# Patient Record
Sex: Male | Born: 1945 | Race: White | Hispanic: Yes | Marital: Single | Smoking: Never smoker
Health system: Southern US, Community
[De-identification: ages and names within clinical notes are randomized; demographics above are authoritative.]

## PROBLEM LIST (undated history)

## (undated) DIAGNOSIS — E119 Type 2 diabetes mellitus without complications: Secondary | ICD-10-CM

## (undated) DIAGNOSIS — I739 Peripheral vascular disease, unspecified: Secondary | ICD-10-CM

## (undated) DIAGNOSIS — I10 Essential (primary) hypertension: Secondary | ICD-10-CM

## (undated) DIAGNOSIS — F1011 Alcohol abuse, in remission: Secondary | ICD-10-CM

---

## 1998-12-06 ENCOUNTER — Emergency Department (HOSPITAL_COMMUNITY): Admission: EM | Admit: 1998-12-06 | Discharge: 1998-12-06 | Payer: Self-pay | Admitting: Emergency Medicine

## 1999-05-14 ENCOUNTER — Emergency Department (HOSPITAL_COMMUNITY): Admission: EM | Admit: 1999-05-14 | Discharge: 1999-05-15 | Payer: Self-pay | Admitting: Emergency Medicine

## 1999-05-15 ENCOUNTER — Encounter: Payer: Self-pay | Admitting: Emergency Medicine

## 2004-04-20 ENCOUNTER — Emergency Department (HOSPITAL_COMMUNITY): Admission: EM | Admit: 2004-04-20 | Discharge: 2004-04-20 | Payer: Self-pay | Admitting: *Deleted

## 2005-02-04 ENCOUNTER — Emergency Department (HOSPITAL_COMMUNITY): Admission: EM | Admit: 2005-02-04 | Discharge: 2005-02-04 | Payer: Self-pay | Admitting: Family Medicine

## 2009-08-09 ENCOUNTER — Emergency Department (HOSPITAL_COMMUNITY): Admission: EM | Admit: 2009-08-09 | Discharge: 2009-08-10 | Payer: Self-pay | Admitting: Emergency Medicine

## 2014-03-22 ENCOUNTER — Encounter (HOSPITAL_COMMUNITY): Payer: Self-pay | Admitting: Emergency Medicine

## 2014-03-22 ENCOUNTER — Emergency Department (HOSPITAL_COMMUNITY)
Admission: EM | Admit: 2014-03-22 | Discharge: 2014-03-22 | Disposition: A | Discharge status: Home / Self Care | Payer: Self-pay | Attending: Emergency Medicine | Admitting: Emergency Medicine

## 2014-03-22 DIAGNOSIS — F10929 Alcohol use, unspecified with intoxication, unspecified: Secondary | ICD-10-CM

## 2014-03-22 DIAGNOSIS — F1010 Alcohol abuse, uncomplicated: Secondary | ICD-10-CM | POA: Insufficient documentation

## 2014-03-22 LAB — RAPID URINE DRUG SCREEN, HOSP PERFORMED
AMPHETAMINES: NOT DETECTED
BENZODIAZEPINES: NOT DETECTED
Barbiturates: NOT DETECTED
COCAINE: NOT DETECTED
Opiates: NOT DETECTED
Tetrahydrocannabinol: NOT DETECTED

## 2014-03-22 LAB — COMPREHENSIVE METABOLIC PANEL
ALT: 36 U/L (ref 0–53)
AST: 54 U/L — ABNORMAL HIGH (ref 0–37)
Albumin: 3.4 g/dL — ABNORMAL LOW (ref 3.5–5.2)
Alkaline Phosphatase: 170 U/L — ABNORMAL HIGH (ref 39–117)
BUN: 4 mg/dL — ABNORMAL LOW (ref 6–23)
CALCIUM: 8.7 mg/dL (ref 8.4–10.5)
CO2: 24 mEq/L (ref 19–32)
Chloride: 97 mEq/L (ref 96–112)
Creatinine, Ser: 0.58 mg/dL (ref 0.50–1.35)
GFR calc non Af Amer: >90 mL/min (ref >90)
GLUCOSE: 285 mg/dL — AB (ref 70–99)
Potassium: 3.8 mEq/L (ref 3.7–5.3)
Sodium: 138 mEq/L (ref 137–147)
TOTAL PROTEIN: 7.4 g/dL (ref 6.0–8.3)
Total Bilirubin: 0.4 mg/dL (ref 0.3–1.2)

## 2014-03-22 LAB — CBC
HCT: 36.1 % — ABNORMAL LOW (ref 39.0–52.0)
HEMOGLOBIN: 12.4 g/dL — AB (ref 13.0–17.0)
MCH: 28.6 pg (ref 26.0–34.0)
MCHC: 34.3 g/dL (ref 30.0–36.0)
MCV: 83.2 fL (ref 78.0–100.0)
Platelets: 161 10*3/uL (ref 150–400)
RBC: 4.34 MIL/uL (ref 4.22–5.81)
RDW: 15 % (ref 11.5–15.5)
WBC: 3.3 10*3/uL — ABNORMAL LOW (ref 4.0–10.5)

## 2014-03-22 LAB — ETHANOL: ALCOHOL ETHYL (B): 294 mg/dL — AB (ref 0–11)

## 2014-03-22 MED ORDER — ONDANSETRON HCL 4 MG PO TABS: 4.0000 mg | ORAL_TABLET | Freq: Three times a day (TID) | ORAL | Status: DC | PRN

## 2014-03-22 MED ORDER — LORAZEPAM 1 MG PO TABS: 1.0000 mg | ORAL_TABLET | Freq: Three times a day (TID) | ORAL | Status: DC | PRN

## 2014-03-22 MED ORDER — ZOLPIDEM TARTRATE 5 MG PO TABS: 5.0000 mg | ORAL_TABLET | Freq: Every evening | ORAL | Status: DC | PRN

## 2014-03-22 MED ORDER — IBUPROFEN 200 MG PO TABS: 600.0000 mg | ORAL_TABLET | Freq: Three times a day (TID) | ORAL | Status: DC | PRN

## 2014-03-22 MED ORDER — NICOTINE 21 MG/24HR TD PT24: 21.0000 mg | MEDICATED_PATCH | Freq: Every day | TRANSDERMAL | Status: DC

## 2014-03-22 MED ORDER — ALUM & MAG HYDROXIDE-SIMETH 200-200-20 MG/5ML PO SUSP: 30.0000 mL | ORAL | Status: DC | PRN

## 2014-03-22 NOTE — Discharge Instructions (Signed)
Problemas Con El Alcohol (Alcohol Problems) La mayora de los adultos que beben alcohol lo hacen con moderacin (no demasiado) y poseen bajo riesgo de Warehouse manager problemas relacionados con la bebida. Sin embargo, todos los bebedores, inclusive los de bajo riesgo, Gaffer los riesgos de la salud que conlleva la ingesta de alcohol. RECOMENDACIONES PARA LOS BEBEDORES DE BAJO RIESGO Beber con moderacin. La ingesta moderada de alcohol se establece de la siguiente manera:   Hombres  no ms de dos tragos Google.  Mujeres  no ms de un trago Google.  Mayores de 65 aos  no ms de un trago Air cabin crew. Un trago estndar es 12 gramos de alcohol puro, lo que es lo mismo que una botella de 12 onzas de cerveza o Merchant navy officer, un vaso de vino de 5 onzas, o 1 onza y media de bebidas blancas (como whisky, brandy, vodka, o ron).  ABSTNGASE (NO BEBA) ALCOHOL:  Si est embarazada o contempla la posibilidad.  Cuando tome un medicamento que interacta con el alcohol.  Si usted es dependiente del alcohol.  Sufre alguna enfermedad en la que se prohba el consumo de alcohol (como lceras, enfermedades hepticas, o enfermedades cardacas). HABLE CON EL MDICO:  Si tiene riesgo de sufrir una enfermedad coronaria, converse sobre los potenciales beneficios y riesgos de la ingesta de alcohol: La ingesta baja a moderada de alcohol est asociada con tasas menores de enfermedades coronarias en ciertas poblaciones (por ejemplo, hombres mayores de 45 aos y mujeres postmenopusicas). Se aconseja a las personas no bebedoras o abstemias no comenzar con la ingesta baja a moderada para reducir el riesgo de enfermedades coronarias en funcin de evitar la aparicin de un problema relacionado con el alcohol. Pueden obtenerse efectos protectores similares a travs de una dieta y ejercicios adecuados.  Las mujeres y los ancianos tienen menos cantidad de agua en el [employer]. Como consecuencia de [place], las mujeres y los  ancianos tienen mayor concentracin de alcohol en la sangre despus de beber la misma cantidad de alcohol.  La exposicin del feto al alcohol puede ocasionar una gran cantidad de defectos de nacimientos dominados Sndrome Alcohlico Fetal (FAS) o Defectos de Nacimiento Relacionados con el Alcohol (ARBD). Aunque los FAS y los ARBD estn asociados con el consumo excesivo de alcohol durante el [place], tambin se han informado trastornos de Leisure centre manager en nios nacidos de madres que informaron haber bebido un trago en promedio por Mudlogger.  El abuso de alcohol (como el consumo de ms de cuatro tragos por ocasin en hombres y ms de tres tragos por ocasin en mujeres) perjudica a lo cognitivo (aprendizaje) y las funciones psicomotoras e incrementa el riesgo de problemas relacionados con el alcohol, inclusive accidentes y daos. PREGUNTAS CECA:   Algunas vez ha sentido que deba cortar con la bebida?  Se ha enojado usted con alguien que lo critic por lo que bebe?  Alguna vez se ha sentido mal o culpable por lo que bebe?  Ha tomado alguna vez un trago por la maana para calmar sus nervios o para deshacerse de una "resaca" (para "abrir los ojos")? Si ha respondido de [place] positiva a [place] de estas preguntas: Podra estar en riesgo de tener problemas relacionados con el alcohol si el consumo del mismo es:   Hombres: Mayor a 14 tragos por semana o ms de 4 tragos por ocasin.  Mujeres: Mayor a 7 tragos por semana o ms de 3 tragos por ocasin. Tiene usted o su familia  alguna historia clnica de problemas relacionados con el alcohol como:  Prdida del conocimiento.  Disfuncin sexual.  Depresin.  Traumatismos.  Enfermedades hepticas.  Trastornos del sueo.  Hipertensin arterial.  Dolor crnico abdominal.  Alguna vez el beber le ha ocasionado problemas, como por ejemplo con su familia, en su rendimiento laboral (o escolar), accidentes o lesiones?  Ha tenido una  compulsin a beber o se ha preocupado mientras lo haca?  Posee poco control o es incapaz de parar de beber una vez que ha comenzado?  Ha tenido que beber para evitar sntomas de abstinencia?  Ha tenido problemas con la abstinencia como temblores, nuseas, sudor o cambios en el humor?  Necesita ms alcohol que antes para emborracharse?  Siente una fuerte necesidad de beber?  Cambia de planes para poder beber?  Alguna vez ha bebido en la maana para aliviar temblores o resaca? Si ha respondido a [place] de estas preguntas de [employer], puede que sea el momento de hablar con un profesional, familiar o amigos y ver si ellos creen que tiene un problema. El alcoholismo es una dependencia qumica que puede Theme park manager y Environmental health practitioner a destruir su salud y Teacher, English as a foreign language. Muchos alcohlicos mueren, se empobrecen o terminan en prisin. Esto es a menudo el resultado de una dependencia qumica.  No se desaliente si no est listo para actuar inmediatamente.  Las decisiones para cambiar su comportamiento a menudo implican altas y bajas entre el deseo de [place] y la sensacin de que no puede decidirse.  Intente pensar ms seriamente sobre su comportamiento frente a la bebida.  Piense en razones para dejarla. PARA OBTENER INFORMACIN ADICIONAL, CONCURRIR A:  The [employer] on Alcohol Abuse and Alcoholism (NIAAA) BasicStudents.dk   [employer] on Alcoholism and Drug Dependence (NCADD) www.ncadd.org  American Society of Addiction Medicine (ASAM) RoyalDiary.gl  Document Released: 01/21/2008 Document Revised: 01/06/2012 [facility] Patient Information 2014 [place], [place].  Intoxicacin alcohlica (Alcohol Intoxication) La intoxicacin alcohlica se produce cuando la cantidad de alcohol que se ha consumido daa la capacidad de funcionamiento mental y fsico. El alcohol deteriora directamente la actividad qumica normal del cerebro. Beber grandes cantidades de alcohol puede conducir  a Building control surveyor funcionamiento mental y en el comportamiento, y puede causar muchos efectos fsicos que pueden ser perjudiciales.  La intoxicacin alcohlica puede variar en gravedad desde leve hasta muy grave. Hay varios factores que pueden afectar el nivel de intoxicacin que se produce, como la edad de la persona, el sexo, el peso, la frecuencia de consumo de alcohol, y la presencia de otras enfermedades mdicas (como diabetes, convulsiones o enfermedades del corazn). Los niveles peligrosos de intoxicacin por alcohol pueden ocurrir [employer]ee personas beben grandes cantidades de alcohol en un corto periodo de tiempo ([place]). El alcohol tambin puede ser especialmente peligroso cuando se combina con ciertos medicamentos recetados o drogas "recreativas". SIGNOS Y SNTOMAS Algunos de los signos y sntomas comunes de intoxicacin leve por alcohol incluyen:  Prdida de la coordinacin.  Cambios en el estado de nimo y la conducta.  Incapacidad para razonar.  Hablar arrastrando las palabras. A medida que la intoxicacin por alcohol avanza a niveles ms graves, Zenaida Niece a Research officer, trade union otros signos y sntomas. Estos pueden ser:  Vmitos.  Confusin y alteracin de Scientist, clinical (histocompatibility and immunogenetics).  Disminucin de Engineer, manufacturing systems.  Convulsiones.  Prdida de la conciencia. DIAGNSTICO  El mdico le har una historia clnica y un examen fsico. Se le preguntar acerca de la cantidad y el tipo de alcohol que ha consumido. Se  le realizarn anlisis de sangre para medir la concentracin de alcohol en sangre. En muchos lugares, el nivel de alcohol en la sangre debe ser inferior a 80 mg / dL (1,61%) para poder conducir legalmente. Sin embargo, hay muchos efectos peligrosos del alcohol que pueden ocurrir con niveles mucho ms bajos.  TRATAMIENTO  Las personas con intoxicacin por alcohol a menudo no requieren TEFL teacher. La mayor parte de los efectos de la intoxicacin por alcohol son temporales, y desaparecen a  medida que el alcohol abandona el cuerpo de forma natural. El profesional controlar su estado hasta que est lo suficientemente estable como para volver a casa. A veces se administran lquidos por va intravenosa para ayudar a evitar la deshidratacin.  INSTRUCCIONES PARA EL CUIDADO EN EL HOGAR  No conduzca vehculos despus de beber alcohol.  Mantngase hidratado. Beba gran cantidad de lquido para mantener la orina de tono claro o color amarillo plido. Evite la cafena.   Tome slo medicamentos de venta libre o recetados, segn las indicaciones del mdico.  SOLICITE ATENCIN MDICA SI:   Tiene vmitos persistentes.   No mejora luego de [employer].  Se intoxica con alcohol con frecuencia. El mdico podr ayudarlo a decidir si debe consultar a un terapeuta especializado en el abuso de sustancias. SOLICITE ATENCIN MDICA DE INMEDIATO SI:   Se siente vacilante o tembloroso cuando trata de abandonar el hbito.   Comienza a temblar de manera incontrolable (convulsiones).   Vomita sangre. Puede ser sangre de color rojo brillante o similar al sedimento del caf negro.   Maxie Better en la materia fecal. Puede ser de color rojo brillante o de aspecto alquitranado, con olor ftido.   Se siente mareado o se desmaya.  ASEGRESE DE QUE:   Comprende estas instrucciones.  Controlar su afeccin.  Recibir ayuda de inmediato si no mejora o si empeora. Document Released: 10/14/2005 Document Revised: 06/16/2013 [facility] Patient Information 2014 [place], [place].  Trastorno por abuso de alcohol  (Alcohol Use Disorder) El trastorno por abuso de alcohol es un trastorno mental. No se trata de un incidente nico en el que se bebe en abundancia. Este trastorno se debe al consumo incontrolable del alcohol a lo largo del [place], que causa dificultades en una o ms reas de la vida diaria. Al consumir en exceso, las personas que sufren este trastorno estn en riesgo de daarse a s mismos  y a Dentist. Tambin puede causar otros trastornos [place], como trastornos del [place] de nimo y trastornos de [place], y problemas fsicos graves. Las personas que sufren trastornos por consumo de alcohol, generalmente abusan de otras sustancias.  Los trastornos por consumo de alcohol son frecuentes y se han generalizado. Algunas personas beben alcohol para poder enfrentarse o escapar a las situaciones negativas de la vida. Otros beben para Government social research officer crnico o los sntomas de una enfermedad mental. Las personas con historia familiar de trastornos por consumo de alcohol tienen ms riesgo de perder el control y de consumir alcohol en exceso.  SNTOMAS  Los signos y sntomas pueden ser:   Consumo dealcohol engrandes cantidades o durante perodos ms prolongados que lo previsto.  Mltiples intentos fallidos de dejar de bebero controlar el consumo de alcohol.   Emplear gran cantidad de tiempo para obtener, consumir o recuperarse de los efectos del alcohol (resaca).  Un deseo intenso o necesidad de consumir alcohol (abstinencia).   Continuar consumiendo alcohol a pesar de los [employer], en la escuela o en el hogar debido al  consumo.   Continuar consumiendo a Scientist, water quality de American Standard Companies de relacin debido al consumo.  Continuar consumiendo an en situaciones que impliquen un peligro fsico, como al conducir un vehculo.  Continuar consumiendo a pesar de saber que un problema fsico o psicolgico probablemente se relaciones con el consumo de alcohol. Los problemas fsicos relacionados con el consumo de alcohol pueden producirse en el cerebro, el corazn, el hgado, el estmago y los intestinos. Los problemas psicolgicos relacionados con el consumo de alcohol incluyen intoxicacin, depresin, ansiedad, psicosis, delirio y Photographer.   La necesidad de aumentar la cantidad de alcohol para Wellsite geologist deseado, o una disminucin del efecto al consumir la misma cantidad de  alcohol (tolerancia).  Sndrome de abstinencia al reducir o Photographer consumo, o tener que consumir alcohol para reducir o Multimedia programmer sndrome de abstinencia. Los sntomas de la abstinencia incluyen:  Frecuencia cardaca acelerada.  Temblores en las manos.  Dificultad para dormir.  Nuseas.  Vmitos.  Alucinaciones.  Agitacin.  Convulsiones. DIAGNSTICO  El mdico es el encargado de diagnosticar el trastorno por consumo de alcohol. Comenzar hacindole tres o cuatro preguntas para evaluar si consume alcohol en forma excesiva o si representa un problema. Para confirmar el diagnstico de trastorno por consumo de alcohol, deben estar presentes al [employer] sntomas (vea SNTOMAS) durante un perodo de 12 meses. La gravedad del trastorno depende del nmero de sntomas:   Leve dos o tres.  Moderado cuatro o cinco.  Grave seis o ms. El mdico podr Sports coach un examen fsico o [employer] Scientific los [place] de las pruebas de laboratorio para ver si usted tiene problemas fsicos como resultado del consumo de alcohol. Podr derivarlo a Administrator, sports de la salud mental para que realice una evaluacin.  TRATAMIENTO  Algunas personas podrn reducir el consumo a niveles en los que haya un bajo riesgo. Otras personas deben dejar de beber. Cuando sea necesario, podr recibir ayuda de los profesionales de la salud mental, capacitados en el tratamiento del abuso de sustancias. El mdico podr informarlo sobre la gravedad de su trastorno por consumo de alcohol y decidir qu tipo de tratamiento necesita. Las formas de tratamiento disponibles son:   Desintoxicacin. La desintoxicacin consiste en el uso de medicamentos recetados para evitar el sndrome de abstinencia durante la primera semana en la que se deja de consumir. Esto es importante para las personas con historia de sndrome de abstinencia y para los que consumen en exceso, quienes probablemente sufran el sndrome de abstinencia. El sndrome de  abstinencia puede ser peligroso y en algunos casos puede causar la [place]. La desintoxicacin generalmente se realiza internado en un hospital o en una institucin para pacientes que abusan de sustancias.  Asesoramiento psicolgico o psicoterapia. La psicoterapia la realizan terapeutas especializados en el tratamiento de pacientes que abusan de sustancias. Se evalan los motivos que llevan a consumir alcohol, y los modos para Furniture conservator/restorer a consumir. Los PepsiCo de la psicoterapia son ayudar a que las personas con trastornos por consumo de alcohol encuentren actividades saludables y Standard Pacific de Radio broadcast assistant estrs de la vida, a identificar y Programmer, applications disparadores que originan el consumo de alcohol y a Scientist, physiological abstinencia, que puede originar una recada.  Medicamentos.Hay diferentes medicamentos que pueden ayudar a tratar el trastorno por consumo de alcohol a travs de los siguientes efectos:  Controlan la ansiedad por consumir.  Disminuyen la respuesta de recompensa positiva que se siente al consumir.  Causan una reaccin fsica desagradable cuando se consume alcohol (terapia  por aversin).  Grupos de apoyo. Los grupos de apoyo son dirigidos por personas que han dejado de consumir. Ellos brindan apoyo emocional, consejo y [place]. Estas formas de tratamiento generalmente se combinan. Algunas personas se benefician con el tratamiento combinado intensivo que se ofrece en algunos centros de tratamiento especializados en el abuso de sustancias. Existen programas para pacientes hospitalizados o ambulatorios.  Document Released: 10/14/2005 Document Revised: 06/16/2013 [facility] Patient Information 2014 [place].

## 2014-03-22 NOTE — BH Assessment (Signed)
Tele Assessment Note   Patrick [name] is an 68 y.o. [facility]. He is spanish speaking and his daughter is present to interpret. Writer suggested to daughter that a interpreter is obtained, however; both parties declined. Writer went forward with the assessment with daughter providing the interpretation. Patient's daughter explains that her father drinks daily. Says that he complained this morning of his stomach hurting and began vomiting. She decided to bring him to the ED for both medical clearance/detox. Patient reports drinking since the age of 68. He drinks (3-4) 48 oz beers daily since age of 68. His last drink was today and he drank (2) 48 oz beers. No hx of seizures or black outs. Patient denies current withdrawal symptoms but does have stomach pains and nausea if he doesn't drink. He participated in a detox/rehab over 11 yrs ago; unable to recall name of facility. No SI, HI, and/or AVH's. Patient does not have any mental health outpatient providers.   Axis I: Alcohol Dependence Axis II: Deferred Axis III: History reviewed. No pertinent past medical history. Axis IV: other psychosocial or environmental problems, problems related to social environment and problems with access to health care services Axis V: 31-40 impairment in reality testing  Past Medical History: History reviewed. No pertinent past medical history.  History reviewed. No pertinent past surgical history.  Family History: No family history on file.  Social History:  reports that he has never smoked. He does not have any smokeless tobacco history on file. He reports that he drinks alcohol. He reports that he does not use illicit drugs.  Additional Social History:  Alcohol / Drug Use Pain Medications: SEE MAR Prescriptions: SEE MAR Over the Counter: SEE MAR History of alcohol / drug use?: Yes Longest period of sobriety (when/how long): 1-2 days Negative Consequences of Use: Personal  relationships Substance #1 Name of Substance 1: Alcohol  1 - Age of First Use: 26 1 - Amount (size/oz): 3-4 48 oz beers 1 - Frequency: daily  1 - Duration: on-going  1 - Last Use / Amount: 03/22/2014  CIWA: CIWA-Ar BP: 162/105 mmHg Pulse Rate: 82 COWS:    Allergies: No Known Allergies  Home Medications:  (Not in a hospital admission)  OB/GYN Status:  No LMP for male patient.  General Assessment Data Location of Assessment: [facility] Is this a Tele or Face-to-Face Assessment?: Face-to-Face Is this an Initial Assessment or a Re-assessment for this encounter?: Initial Assessment Living Arrangements: Other (Comment);Spouse/significant other (patient lives with spouse) Can pt return to current living arrangement?: Yes Admission Status: Voluntary Is patient capable of signing voluntary admission?: Yes [facility] Living Arrangements: Other (Comment);Spouse/significant other (patient lives with spouse) Name of Psychiatrist:  (No psychiatrist ) Name of Therapist:  (No therapist )  Education Status Is patient currently in school?: No  Risk to self Suicidal Ideation: No Suicidal Intent: No Is patient at risk for suicide?: No Suicidal Plan?: No Access to Means: No What has been your use of drugs/alcohol within the last 12 months?:  (Patient is calm and cooperative ) Previous Attempts/Gestures: No How many times?:  (0) Other Self Harm Risks:  (n/a) Intentional Self Injurious Behavior: None Family Suicide History: No Recent stressful life event(s): Other (Comment);Conflict (Comment) ("Wife is upset with me b/c of my drinking") Persecutory voices/beliefs?: No Depression: No Depression Symptoms: Fatigue Substance abuse history and/or treatment for substance abuse?: No Suicide prevention information given  to non-admitted patients: Not applicable  Risk to Others Homicidal Ideation: No Thoughts of Harm to Others: No Current Homicidal  Intent: No Current Homicidal Plan: No Access to Homicidal Means: No Identified Victim:  (n/a) History of harm to others?: No Assessment of Violence: None Noted Violent Behavior Description:  (calm and cooperative ) Does patient have access to weapons?: No Criminal Charges Pending?: No Does patient have a court date: No  Psychosis Hallucinations: None noted Delusions: None noted  Mental Status Report Appear/Hygiene: Disheveled Eye Contact: Good Motor Activity: Freedom of movement Speech: Logical/coherent Level of Consciousness: Alert Mood: Depressed Affect: Appropriate to circumstance Anxiety Level: None Thought Processes: Coherent;Relevant Judgement: Unimpaired Orientation: Person;Place;Time;Situation Obsessive Compulsive Thoughts/Behaviors: None  Cognitive Functioning Concentration: Normal Memory: Recent Intact;Remote Intact IQ: Average Insight: Good Impulse Control: Good Appetite: Good Weight Loss:  (none reported ) Weight Gain:  (none reported ) Sleep: Decreased Total Hours of Sleep:  ("wakes up at 2am and stays up all day") Vegetative Symptoms: None  ADLScreening [facility] Assessment Services) Patient's cognitive ability adequate to safely complete daily activities?: Yes Patient able to express need for assistance with ADLs?: Yes Independently performs ADLs?: Yes (appropriate for developmental age)  Prior Inpatient Therapy Prior Inpatient Therapy: Yes Prior Therapy Dates:  (11 yrs ago) Prior Therapy Facilty/Provider(s):  (Pt unable to recall; daughter present & unable to recalll)  Prior Outpatient Therapy Prior Outpatient Therapy: No Prior Therapy Dates:  (n/a) Prior Therapy Facilty/Provider(s):  (n/a) Reason for Treatment:  (n/a)  ADL Screening (condition at time of admission) Patient's cognitive ability adequate to safely complete daily activities?: Yes Is the patient deaf or have difficulty hearing?: No Does the patient have difficulty seeing, even when  wearing glasses/contacts?: No Does the patient have difficulty concentrating, remembering, or making decisions?: Yes Patient able to express need for assistance with ADLs?: Yes Does the patient have difficulty dressing or bathing?: No Independently performs ADLs?: Yes (appropriate for developmental age) Does the patient have difficulty walking or climbing stairs?: No Weakness of Legs: None Weakness of Arms/Hands: None  Home Assistive Devices/Equipment Home Assistive Devices/Equipment: None    Abuse/Neglect Assessment (Assessment to be complete while patient is alone) Physical Abuse: Denies Verbal Abuse: Denies Sexual Abuse: Denies Exploitation of patient/patient's resources: Denies Self-Neglect: Denies Values / Beliefs Cultural Requests During Hospitalization: None Spiritual Requests During Hospitalization: None   Advance Directives (For Healthcare) Advance Directive: Patient does not have advance directive Nutrition Screen- [facility] Adult/[facility] Patient's home diet: Regular  Additional Information 1:1 In Past 12 Months?: No CIRT Risk: No Elopement Risk: No Does patient have medical clearance?: Yes     Disposition:  Disposition Initial Assessment Completed for this Encounter: Yes Disposition of Patient: Treatment offered and refused (Detox at [facility] and/or any other detox facility; outpatient ) Type of treatment offered and refused: Out-patient;In-patient;Intensive outpatient  Patrick [name] 03/22/2014 6:59 PM

## 2014-03-22 NOTE — ED Notes (Signed)
Per daughter-states her father drinks 3 48 oz of beer daily and does not feel well-has headaches, and vomits occasionally

## 2014-03-22 NOTE — ED Provider Notes (Signed)
Medical screening examination/treatment/procedure(s) were conducted as a shared visit with non-physician practitioner(s) and myself.  I personally evaluated the patient during the encounter.   EKG Interpretation None      Pt is a 68 y.o. M with history of alcohol abuse who presents to the emergency room with his daughter who is requesting detox. Patient initially wanted detox but now denies that. He is currently intoxicated. No medical complaints. Patient has been drinking for over 40 years. Daughter wants him to be committed for detox. Have explained to her that we cannot keep him against his will for rehabilitation. He has no current safety concerns. We'll discharge with his daughter who is sober. He has been given outpatient rehabilitation information.  Layla Maw Ward, DO 03/22/14 [place]

## 2014-03-22 NOTE — ED Provider Notes (Signed)
CSN: [id number]     Arrival date & time 03/22/14  1545 History   First MD Initiated Contact with Patient 03/22/14 1658     Chief Complaint  Patient presents with  . Alcohol Problem     (Consider location/radiation/quality/duration/timing/severity/associated sxs/prior Treatment) HPI Comments: 68-year-old male with no known past medical history presents to the emergency department with his daughter requesting detox from alcohol and help with his addiction. Patient has been drinking alcohol for the past 40 years, 3 40 ounce beers daily. About 11 years ago he was admitted to a treatment program, however when sleeping this program he immediately started drinking again. Patient and daughter both state this is interfering with his life. He has never been able to detox on his own. Denies history of withdrawal seizures. States he occasionally gets nauseated and vomits after drinking followed by a headache. Currently he is asymptomatic. He last had a beer about one hour prior to arrival. Denies any other drug use. Denies suicidal or homicidal ideations.  Patient is a 68 y.o. male presenting with alcohol problem. The history is provided by the patient and a relative. The history is limited by a language barrier. A language interpreter was used.  Alcohol Problem    History reviewed. No pertinent past medical history. History reviewed. No pertinent past surgical history. No family history on file. History  Substance Use Topics  . Smoking status: Never Smoker   . Smokeless tobacco: Not on file  . Alcohol Use: Yes    Review of Systems  Psychiatric/Behavioral:       Positive for alcohol problem.  All other systems reviewed and are negative.     Allergies  Review of patient's allergies indicates no known allergies.  Home Medications   Prior to Admission medications   Not on File   BP 162/105  Pulse 82  Temp(Src) 98.2 F (36.8 C) (Oral)  Resp 18  SpO2 97% Physical Exam  Nursing note  and vitals reviewed. Constitutional: He is oriented to person, place, and time. He appears well-developed and well-nourished. No distress.  Unkempt.  HENT:  Head: Normocephalic and atraumatic.  Mouth/Throat: Oropharynx is clear and moist.  Eyes: Conjunctivae are normal.  Neck: Normal range of motion. Neck supple.  Cardiovascular: Normal rate, regular rhythm and normal heart sounds.   Pulmonary/Chest: Effort normal and breath sounds normal.  Abdominal: Soft. Bowel sounds are normal. He exhibits no distension. There is no tenderness.  Musculoskeletal: Normal range of motion. He exhibits no edema.  Neurological: He is alert and oriented to person, place, and time.  Skin: Skin is warm and dry. He is not diaphoretic.  Psychiatric: He has a normal mood and affect. His behavior is normal.    ED Course  Procedures (including critical care time) Labs Review Labs Reviewed  CBC - Abnormal; Notable for the following:    WBC 3.3 (*)    Hemoglobin 12.4 (*)    HCT 36.1 (*)    All other components within normal limits  COMPREHENSIVE METABOLIC PANEL - Abnormal; Notable for the following:    Glucose, Bld 285 (*)    BUN 4 (*)    Albumin 3.4 (*)    AST 54 (*)    Alkaline Phosphatase 170 (*)    All other components within normal limits  ETHANOL - Abnormal; Notable for the following:    Alcohol, Ethyl (B) 294 (*)    All other components within normal limits  URINE RAPID DRUG SCREEN (HOSP PERFORMED)  Imaging Review No results found.   EKG Interpretation None      MDM   Final diagnoses:  Alcohol abuse  Alcohol intoxication    Patient presenting requesting detox from alcohol. He is well appearing and in no apparent distress. Hypertensive, vitals otherwise stable. Asymptomatic at this time. Labs pending. TTS consult.  6:23 PM Pt evaluated by Jessie Footoyka, stating he does not want inpatient treatment or help with detox at this time. Elevated alkaline phosphatase at 170 and AST of 54, abdomen  is soft and nontender, alcohol level 294. Outpatient resources given. Stable for discharge. Return precautions given. Patient states understanding of treatment care plan and is agreeable.  Case discussed with attending Dr. Elesa MassedWard who also evaluated patient and agrees with plan of care.   Trevor MaceRobyn M Albert, PA-C 03/22/14 1824

## 2021-10-19 ENCOUNTER — Other Ambulatory Visit: Payer: Self-pay

## 2021-10-19 ENCOUNTER — Emergency Department (HOSPITAL_COMMUNITY)
Admission: EM | Admit: 2021-10-19 | Discharge: 2021-10-19 | Disposition: A | Discharge status: Home / Self Care | Payer: Self-pay | Attending: Emergency Medicine | Admitting: Emergency Medicine

## 2021-10-19 ENCOUNTER — Emergency Department (HOSPITAL_COMMUNITY): Payer: Self-pay

## 2021-10-19 DIAGNOSIS — L03116 Cellulitis of left lower limb: Secondary | ICD-10-CM | POA: Insufficient documentation

## 2021-10-19 DIAGNOSIS — R21 Rash and other nonspecific skin eruption: Secondary | ICD-10-CM | POA: Insufficient documentation

## 2021-10-19 LAB — BASIC METABOLIC PANEL
Anion gap: 10 (ref 5–15)
BUN: 16 mg/dL (ref 8–23)
CO2: 21 mmol/L — ABNORMAL LOW (ref 22–32)
Calcium: 9.3 mg/dL (ref 8.9–10.3)
Chloride: 100 mmol/L (ref 98–111)
Creatinine, Ser: 0.71 mg/dL (ref 0.61–1.24)
GFR, Estimated: >60 mL/min (ref >60)
Glucose, Bld: 381 mg/dL — ABNORMAL HIGH (ref 70–99)
Potassium: 4 mmol/L (ref 3.5–5.1)
Sodium: 131 mmol/L — ABNORMAL LOW (ref 135–145)

## 2021-10-19 LAB — CBC WITH DIFFERENTIAL/PLATELET
Abs Immature Granulocytes: 0.03 10*3/uL (ref 0.00–0.07)
Basophils Absolute: 0 10*3/uL (ref 0.0–0.1)
Basophils Relative: 0 %
Eosinophils Absolute: 0.1 10*3/uL (ref 0.0–0.5)
Eosinophils Relative: 1 %
HCT: 41.4 % (ref 39.0–52.0)
Hemoglobin: 13.6 g/dL (ref 13.0–17.0)
Immature Granulocytes: 0 %
Lymphocytes Relative: 21 %
Lymphs Abs: 1.9 10*3/uL (ref 0.7–4.0)
MCH: 28 pg (ref 26.0–34.0)
MCHC: 32.9 g/dL (ref 30.0–36.0)
MCV: 85.4 fL (ref 80.0–100.0)
Monocytes Absolute: 0.5 10*3/uL (ref 0.1–1.0)
Monocytes Relative: 5 %
Neutro Abs: 6.3 10*3/uL (ref 1.7–7.7)
Neutrophils Relative %: 73 %
Platelets: 215 10*3/uL (ref 150–400)
RBC: 4.85 MIL/uL (ref 4.22–5.81)
RDW: 13.1 % (ref 11.5–15.5)
WBC: 8.8 10*3/uL (ref 4.0–10.5)
nRBC: 0 % (ref 0.0–0.2)

## 2021-10-19 LAB — PROTIME-INR
INR: 1.1 (ref 0.8–1.2)
Prothrombin Time: 13.7 seconds (ref 11.4–15.2)

## 2021-10-19 LAB — APTT: aPTT: 28 seconds (ref 24–36)

## 2021-10-19 MED ORDER — DOXYCYCLINE HYCLATE 100 MG PO CAPS: 100.0000 mg | ORAL_CAPSULE | Freq: Two times a day (BID) | ORAL | 0 refills | Status: DC

## 2021-10-19 MED ORDER — DOXYCYCLINE HYCLATE 100 MG PO TABS
100.0000 mg | ORAL_TABLET | Freq: Once | ORAL | Status: AC
  Administered 2021-10-19: 19:00:00 100 mg via ORAL
  Filled 2021-10-19: qty 1

## 2021-10-19 NOTE — ED Triage Notes (Signed)
Pt here w/ son for hematoma to L knee, pt denies any falls or injury. Pt reports pain at the site and going down his leg, worse pain w/ walking. Pt states he can hardly put any weight on it.

## 2021-10-19 NOTE — Discharge Instructions (Signed)
You may take Tylenol 650 mg every 6 hours and ibuprofen 400 mg every 6 hours for pain, please take the following antibiotic  Doxycycline 100mg  by mouth twice daily until the medicine is completely finished - this medicine is a strong antibiotic that treats certain infections.    If you develop increasing or worsening redness swelling pain or fever return to the emergency department immediately, you should have your family doctor recheck you within 48 hours or return to the emergency department if it is getting worse at any point

## 2021-10-19 NOTE — ED Provider Notes (Signed)
Emergency Medicine Provider Triage Evaluation Note  Patrick [name] , a 75 y.o. male  was evaluated in triage.  Pt complains of left knee pain, swelling, bruising for the last 8 days.  Patient denies any injury to the knee either recently or in the past.  Patient reports worsening swelling.  Patient does not take any blood thinners.  Patient has not been taking anything other than over-the-counter's for pain.  Patient reports pain worse with walking, difficult to weight-bear.  Review of Systems  Positive: Knee pain, hematoma Negative: Numbness, tingling  Physical Exam  BP (!) 177/94 (BP Location: Left Arm)    Pulse 95    Temp 97.7 F (36.5 C)    Resp 14    SpO2 96%  Gen:   Awake, no distress   Resp:  Normal effort  MSK:   Moves extremities without difficulty  Other:  TTP left knee, visible hematoma inferior to patella, intact DP/PT pulses  Medical Decision Making  Medically screening exam initiated at 12:10 PM.  Appropriate orders placed.  Patrick [name] was informed that the remainder of the evaluation will be completed by another provider, this initial triage assessment does not replace that evaluation, and the importance of remaining in the ED until their evaluation is complete.  Knee pain, hematoma   [place] 10/19/21 1211    Gerhard Munch, MD 10/19/21 [phone]

## 2021-10-19 NOTE — ED Provider Notes (Signed)
[facility] EMERGENCY DEPARTMENT Provider Note   CSN: [id number] Arrival date & time: 10/19/21  1104     History Chief Complaint  Patient presents with   Knee Pain    Patrick [name] is a 75 y.o. male.   Knee Pain Associated symptoms: no fever    This patient is a 75 year old male, he is a known diabetic as well as having hypertension, he does take oral antidiabetic medications and states that he is compliant with his medications.  He presents to the hospital with a couple of days of worsening swelling of his left knee, this is anterior it is associated with red hot skin that is starting to spread laterally both medially and laterally.  He has no numbness or weakness of the leg but does have increasing pain when he tries to bend his knee or stand on his leg.  He does not have fever she does not have chills and he has not been seen by medical provider for this complaint.  No past medical history on file.  There are no problems to display for this patient.   No past surgical history on file.     No family history on file.  Social History   Tobacco Use   Smoking status: Never  Substance Use Topics   Alcohol use: Yes   Drug use: No    Home Medications Prior to Admission medications   Medication Sig Start Date End Date Taking? Authorizing Provider  doxycycline (VIBRAMYCIN) 100 MG capsule Take 1 capsule (100 mg total) by mouth 2 (two) times daily. 10/19/21  Yes Eber Hong, MD    Allergies    Patient has no known allergies.  Review of Systems   Review of Systems  Constitutional:  Negative for fever.  Skin:  Positive for rash.   Physical Exam Updated Vital Signs BP (!) 171/110 (BP Location: Left Arm)    Pulse 95    Temp 97.7 F (36.5 C)    Resp 18    SpO2 97%   Physical Exam Vitals and nursing note reviewed.  Constitutional:      Appearance: He is well-developed. He is not diaphoretic.  HENT:     Head: Normocephalic and atraumatic.   Eyes:     General:        Right eye: No discharge.        Left eye: No discharge.     Conjunctiva/sclera: Conjunctivae normal.  Pulmonary:     Effort: Pulmonary effort is normal. No respiratory distress.  Musculoskeletal:        General: Swelling and tenderness present. No deformity or signs of injury.     Right lower leg: No edema.     Left lower leg: No edema.     Comments: All 4 extremities are completely normal in the range of motion in appearance except for the left knee which has some redness and warmth with minimal induration of the anterior knee and cellulitis spreading both medially and laterally.  There is no joint effusion and the patella is able to move freely.  The patient is unable to flex his knee very much because of pain in the skin however he does not have a joint effusion.  Both the ankle and the hip on the left side appear normal.  Skin:    General: Skin is warm and dry.     Findings: Erythema and rash present.  Neurological:     Mental Status: He is alert.  Coordination: Coordination normal.    ED Results / Procedures / Treatments   Labs (all labs ordered are listed, but only abnormal results are displayed) Labs Reviewed  BASIC METABOLIC PANEL - Abnormal; Notable for the following components:      Result Value   Sodium 131 (*)    CO2 21 (*)    Glucose, Bld 381 (*)    All other components within normal limits  CBC WITH DIFFERENTIAL/PLATELET  PROTIME-INR  APTT    EKG None  Radiology DG Knee Complete 4 Views Left  Result Date: 10/19/2021 CLINICAL DATA:  Left knee pain and swelling over the last week. EXAM: LEFT KNEE - COMPLETE 4+ VIEW COMPARISON:  None. FINDINGS: No evidence of fracture. No joint effusion. Pronounced prepatellar soft tissue swelling that could go along with tendinitis or prepatellar bursitis. Extensive regional arterial calcification is noted. IMPRESSION: Pronounced prepatellar soft tissue swelling that could go along with tendinitis  or prepatellar bursitis. No joint effusion or fracture. Extensive arterial calcification. Electronically Signed   By: Paulina Fusi M.D.   On: 10/19/2021 12:29    Procedures Procedures   Medications Ordered in ED Medications  doxycycline (VIBRA-TABS) tablet 100 mg (has no administration in time range)    ED Course  I have reviewed the triage vital signs and the nursing notes.  Pertinent labs & imaging results that were available during my care of the patient were reviewed by me and considered in my medical decision making (see chart for details).    MDM Rules/Calculators/A&P                          This patient presents to the ED for concern of pain and redness of the left knee, this involves an extensive number of treatment options, and is a complaint that carries with it a high risk of complications and morbidity.  The differential diagnosis includes cellulitis, bursitis, abscess, septic arthritis, fracture, however the patient has not had any injuries.   Additional history obtained:  Additional history obtained from nothing External records from outside source obtained and reviewed including nothing, no other chart review available, family member at the bedside corroborates patient's story   Lab Tests:  I Ordered, reviewed, and interpreted labs.  The pertinent results include: Normal white blood cell count, the patient is hyperglycemic but has not had his medications this afternoon.   Imaging Studies ordered:  I ordered imaging studies including knee x-ray I independently visualized and interpreted imaging which showed no signs of fracture, some soft tissue swelling in the prepatellar area consistent with exam I agree with the radiologist interpretation   Cardiac Monitoring:  The patient was maintained on a cardiac monitor.  I personally viewed and interpreted the cardiac monitored which showed an underlying rhythm of: Normal sinus rhythm, no arrhythmia   Medicines  ordered and prescription drug management:  I ordered medication including doxycycline for cellulitis Reevaluation of the patient after these medicines showed that the patient stayed the same I have reviewed the patients home medicines and have made adjustments as needed   Critical Interventions:  Antibiotics, x-ray, rule out septic arthritis clinically   Reevaluation:  After the interventions noted above, I reevaluated the patient and found that they have :improved   Dispostion:  After consideration of the diagnostic results and the patients response to treatment feel that the patent would benefit from home.  No signs of abscess clinically, no signs of septic arthritis, vital signs without  fever or tachycardia, heart rate is 80 on my exam, no leukocytosis, patient amenable to doxycycline treatment and close follow-up.  He is agreeable to the plan.  First dose given in ED  The patient tells me that he does have a family doctor to follow-up with.  I have had him take a picture of his knee in the room so that they can share this with the doctor and follow-up     Final Clinical Impression(s) / ED Diagnoses Final diagnoses:  Cellulitis of knee, left    Rx / DC Orders ED Discharge Orders          Ordered    doxycycline (VIBRAMYCIN) 100 MG capsule  2 times daily        10/19/21 1808             Eber Hong, MD 10/19/21 [phone]

## 2021-10-23 ENCOUNTER — Inpatient Hospital Stay (HOSPITAL_COMMUNITY)
Admission: EM | Admit: 2021-10-23 | Discharge: 2021-10-26 | DRG: 501 | Disposition: A | Discharge status: Home / Self Care | Payer: Self-pay | Attending: Internal Medicine | Admitting: Internal Medicine

## 2021-10-23 ENCOUNTER — Emergency Department (HOSPITAL_COMMUNITY): Payer: Self-pay

## 2021-10-23 ENCOUNTER — Encounter (HOSPITAL_COMMUNITY): Payer: Self-pay

## 2021-10-23 ENCOUNTER — Other Ambulatory Visit: Payer: Self-pay

## 2021-10-23 DIAGNOSIS — I160 Hypertensive urgency: Secondary | ICD-10-CM | POA: Diagnosis present

## 2021-10-23 DIAGNOSIS — L03116 Cellulitis of left lower limb: Secondary | ICD-10-CM | POA: Diagnosis present

## 2021-10-23 DIAGNOSIS — B9561 Methicillin susceptible Staphylococcus aureus infection as the cause of diseases classified elsewhere: Secondary | ICD-10-CM | POA: Diagnosis present

## 2021-10-23 DIAGNOSIS — E1165 Type 2 diabetes mellitus with hyperglycemia: Secondary | ICD-10-CM | POA: Diagnosis present

## 2021-10-23 DIAGNOSIS — M7042 Prepatellar bursitis, left knee: Secondary | ICD-10-CM

## 2021-10-23 DIAGNOSIS — Z20822 Contact with and (suspected) exposure to covid-19: Secondary | ICD-10-CM | POA: Diagnosis present

## 2021-10-23 DIAGNOSIS — M009 Pyogenic arthritis, unspecified: Secondary | ICD-10-CM | POA: Diagnosis present

## 2021-10-23 DIAGNOSIS — I10 Essential (primary) hypertension: Secondary | ICD-10-CM | POA: Diagnosis present

## 2021-10-23 DIAGNOSIS — M71162 Other infective bursitis, left knee: Principal | ICD-10-CM | POA: Diagnosis present

## 2021-10-23 DIAGNOSIS — Z5986 Financial insecurity: Secondary | ICD-10-CM

## 2021-10-23 DIAGNOSIS — D8481 Immunodeficiency due to conditions classified elsewhere: Secondary | ICD-10-CM | POA: Diagnosis present

## 2021-10-23 DIAGNOSIS — L03119 Cellulitis of unspecified part of limb: Secondary | ICD-10-CM | POA: Diagnosis present

## 2021-10-23 HISTORY — DX: Type 2 diabetes mellitus without complications: E11.9

## 2021-10-23 LAB — SEDIMENTATION RATE: Sed Rate: 115 mm/hr — ABNORMAL HIGH (ref 0–16)

## 2021-10-23 LAB — CBG MONITORING, ED: Glucose-Capillary: 258 mg/dL — ABNORMAL HIGH (ref 70–99)

## 2021-10-23 LAB — CBC WITH DIFFERENTIAL/PLATELET
Abs Immature Granulocytes: 0.03 10*3/uL (ref 0.00–0.07)
Basophils Absolute: 0 10*3/uL (ref 0.0–0.1)
Basophils Relative: 1 %
Eosinophils Absolute: 0 10*3/uL (ref 0.0–0.5)
Eosinophils Relative: 0 %
HCT: 40.8 % (ref 39.0–52.0)
Hemoglobin: 13 g/dL (ref 13.0–17.0)
Immature Granulocytes: 0 %
Lymphocytes Relative: 15 %
Lymphs Abs: 1.3 10*3/uL (ref 0.7–4.0)
MCH: 27.8 pg (ref 26.0–34.0)
MCHC: 31.9 g/dL (ref 30.0–36.0)
MCV: 87.2 fL (ref 80.0–100.0)
Monocytes Absolute: 0.5 10*3/uL (ref 0.1–1.0)
Monocytes Relative: 6 %
Neutro Abs: 6.8 10*3/uL (ref 1.7–7.7)
Neutrophils Relative %: 78 %
Platelets: 245 10*3/uL (ref 150–400)
RBC: 4.68 MIL/uL (ref 4.22–5.81)
RDW: 13.2 % (ref 11.5–15.5)
WBC: 8.6 10*3/uL (ref 4.0–10.5)
nRBC: 0 % (ref 0.0–0.2)

## 2021-10-23 LAB — RESP PANEL BY RT-PCR (FLU A&B, COVID) ARPGX2
Influenza A by PCR: NEGATIVE
Influenza B by PCR: NEGATIVE
SARS Coronavirus 2 by RT PCR: NEGATIVE

## 2021-10-23 LAB — BASIC METABOLIC PANEL
Anion gap: 11 (ref 5–15)
BUN: 20 mg/dL (ref 8–23)
CO2: 23 mmol/L (ref 22–32)
Calcium: 9.6 mg/dL (ref 8.9–10.3)
Chloride: 102 mmol/L (ref 98–111)
Creatinine, Ser: 0.7 mg/dL (ref 0.61–1.24)
GFR, Estimated: >60 mL/min (ref >60)
Glucose, Bld: 383 mg/dL — ABNORMAL HIGH (ref 70–99)
Potassium: 3.9 mmol/L (ref 3.5–5.1)
Sodium: 136 mmol/L (ref 135–145)

## 2021-10-23 LAB — SYNOVIAL CELL COUNT + DIFF, W/ CRYSTALS
Other Cells-SYN: UNDETERMINED
WBC, Synovial: UNDETERMINED /mm3 (ref 0–200)

## 2021-10-23 LAB — C-REACTIVE PROTEIN: CRP: 15.1 mg/dL — ABNORMAL HIGH (ref <1.0)

## 2021-10-23 MED ORDER — PENTAFLUOROPROP-TETRAFLUOROETH EX AERO
INHALATION_SPRAY | Freq: Once | CUTANEOUS | Status: AC
  Filled 2021-10-23: qty 116

## 2021-10-23 MED ORDER — SODIUM CHLORIDE 0.9 % IV BOLUS
500.0000 mL | Freq: Once | INTRAVENOUS | Status: AC
  Administered 2021-10-23: 21:00:00 500 mL via INTRAVENOUS

## 2021-10-23 MED ORDER — VANCOMYCIN HCL IN DEXTROSE 1-5 GM/200ML-% IV SOLN: 1000.0000 mg | INTRAVENOUS | Status: DC

## 2021-10-23 MED ORDER — ACETAMINOPHEN 650 MG RE SUPP: 650.0000 mg | Freq: Four times a day (QID) | RECTAL | Status: DC | PRN

## 2021-10-23 MED ORDER — INSULIN ASPART 100 UNIT/ML IJ SOLN
0.0000 [IU] | INTRAMUSCULAR | Status: DC
Start: 2021-10-23 — End: 2021-10-26
  Administered 2021-10-24: 5 [IU] via SUBCUTANEOUS
  Administered 2021-10-24: 17:00:00 3 [IU] via SUBCUTANEOUS
  Administered 2021-10-24: 04:00:00 4 [IU] via SUBCUTANEOUS
  Administered 2021-10-24: 23:00:00 3 [IU] via SUBCUTANEOUS
  Administered 2021-10-24: 10:00:00 2 [IU] via SUBCUTANEOUS
  Administered 2021-10-25: 22:00:00 3 [IU] via SUBCUTANEOUS
  Administered 2021-10-25: 16:00:00 2 [IU] via SUBCUTANEOUS
  Administered 2021-10-25 (×4): 3 [IU] via SUBCUTANEOUS
  Administered 2021-10-26 (×3): 2 [IU] via SUBCUTANEOUS

## 2021-10-23 MED ORDER — VANCOMYCIN HCL 1500 MG/300ML IV SOLN
1500.0000 mg | Freq: Once | INTRAVENOUS | Status: AC
  Administered 2021-10-23: 23:00:00 1500 mg via INTRAVENOUS
  Filled 2021-10-23: qty 300

## 2021-10-23 MED ORDER — SODIUM CHLORIDE 0.9 % IV SOLN: 2.0000 g | Freq: Three times a day (TID) | INTRAVENOUS | Status: DC

## 2021-10-23 MED ORDER — SODIUM CHLORIDE 0.9 % IV SOLN
2.0000 g | Freq: Two times a day (BID) | INTRAVENOUS | Status: DC
  Administered 2021-10-24 – 2021-10-25 (×3): 2 g via INTRAVENOUS
  Filled 2021-10-23 (×4): qty 2

## 2021-10-23 MED ORDER — IOHEXOL 350 MG/ML SOLN
75.0000 mL | Freq: Once | INTRAVENOUS | Status: AC | PRN
  Administered 2021-10-23: 18:00:00 75 mL via INTRAVENOUS

## 2021-10-23 MED ORDER — HYDROCODONE-ACETAMINOPHEN 5-325 MG PO TABS
1.0000 | ORAL_TABLET | Freq: Once | ORAL | Status: AC
  Administered 2021-10-23: 21:00:00 1 via ORAL
  Filled 2021-10-23: qty 1

## 2021-10-23 MED ORDER — PIPERACILLIN-TAZOBACTAM 3.375 G IVPB 30 MIN
3.3750 g | Freq: Once | INTRAVENOUS | Status: AC
  Administered 2021-10-23: 21:00:00 3.375 g via INTRAVENOUS
  Filled 2021-10-23: qty 50

## 2021-10-23 MED ORDER — SODIUM CHLORIDE 0.9 % IV SOLN: 2.0000 g | Freq: Once | INTRAVENOUS | Status: DC

## 2021-10-23 MED ORDER — ACETAMINOPHEN 325 MG PO TABS
650.0000 mg | ORAL_TABLET | Freq: Four times a day (QID) | ORAL | Status: DC | PRN
  Administered 2021-10-25 (×2): 650 mg via ORAL
  Filled 2021-10-23 (×3): qty 2

## 2021-10-23 NOTE — ED Triage Notes (Signed)
Pt reports increased pain and redness to L knee. Was seen for same on 12/23 and prescribed doxycycline, which he has been taking as prescribed without significant improvement. Taking Motrin without relief.

## 2021-10-23 NOTE — H&P (Signed)
History and Physical    Stokes Rattigan WUJ:[id number] DOB: Dec 06, 1945 DOA: 10/23/2021  PCP: Pcp, No  Patient coming from: Home.  Spanish translation provided by patient's son.  Chief Complaint: Pain around the left knee.  HPI: Patrick [name] is a 75 y.o. male with history of diabetes mellitus type 2 diagnosed about 5 years ago has not been taking any medications presents to the ER for the second time in the last 3 days with worsening pain in the left knee.  Patient symptoms started about a week ago and had come to the ER 3 days ago was prescribed antibiotics despite he which patient's symptoms have been worsening.  Swelling around the left knee has been worsening and patient has some difficulty moving the left knee.  Denies any fever chills.  About a week ago patient did fall onto the knee.  ED Course: In the ER patient had temperature of 99.1 F CT scan shows 6 cm into 2 cm rim-enhancing collection of fluid anterior aspect of the left knee which could be an infected bursitis versus soft tissue abscess.  ER physician discussed with Dr. Linna Caprice on-call orthopedic surgeon who advised to get it aspirated and start empiric antibiotics.  Patient is also found to have markedly elevated blood pressure and hyperglycemia.  Patient admitted for further management.  COVID test negative.  Review of Systems: As per HPI, rest all negative.   Past Medical History:  Diagnosis Date   Diabetes mellitus without complication (HCC)     History reviewed. No pertinent surgical history.   reports that he has never smoked. He does not have any smokeless tobacco history on file. He reports current alcohol use. He reports that he does not use drugs.  No Known Allergies  Family History  Family history unknown: Yes    Prior to Admission medications   Medication Sig Start Date End Date Taking? Authorizing Provider  doxycycline (VIBRAMYCIN) 100 MG capsule Take 1 capsule (100 mg total) by mouth 2 (two)  times daily. 10/19/21  Yes Eber Hong, MD  ibuprofen (ADVIL) 200 MG tablet Take 200-400 mg by mouth every 6 (six) hours as needed for mild pain.   Yes [provider]    Physical Exam: Constitutional: Moderately built and nourished. Vitals:   10/23/21 1140 10/23/21 2057  BP: (!) 178/127 (!) 212/72  Pulse: 96 88  Resp: 16 16  Temp: 99.1 F (37.3 C)   TempSrc: Oral   SpO2: 98% 99%   Eyes: Anicteric no pallor. ENMT: No discharge from the ears eyes nose or mouth. Neck: No mass felt.  No neck rigidity. Respiratory: No rhonchi or crepitations. Cardiovascular: S1-S2 heard. Abdomen: Soft nontender bowel sound present. Musculoskeletal: Swelling around the anterior aspect of the left knee extending from the lower aspect of the patella to the upper aspect of the mid part of the left tibia. Skin: Swelling and rash around the left knee as explained in the musculoskeletal system. Neurologic: Alert awake oriented time place and person.  Moves all extremities. Psychiatric: Appears normal.  Normal affect.   Labs on Admission: I have personally reviewed following labs and imaging studies  CBC: Recent Labs  Lab 10/19/21 1209 10/23/21 1231  WBC 8.8 8.6  NEUTROABS 6.3 6.8  HGB 13.6 13.0  HCT 41.4 40.8  MCV 85.4 87.2  PLT 215 245   Basic Metabolic Panel: Recent Labs  Lab 10/19/21 1209 10/23/21 1231  NA 131* 136  K 4.0 3.9  CL 100 102  CO2 21* 23  GLUCOSE 381* 383*  BUN 16 20  CREATININE 0.71 0.70  CALCIUM 9.3 9.6   GFR: CrCl cannot be calculated (Unknown ideal weight.). Liver Function Tests: No results for input(s): AST, ALT, ALKPHOS, BILITOT, PROT, ALBUMIN in the last 168 hours. No results for input(s): LIPASE, AMYLASE in the last 168 hours. No results for input(s): AMMONIA in the last 168 hours. Coagulation Profile: Recent Labs  Lab 10/19/21 1209  INR 1.1   Cardiac Enzymes: No results for input(s): CKTOTAL, CKMB, CKMBINDEX, TROPONINI in the last 168  hours. BNP (last 3 results) No results for input(s): PROBNP in the last 8760 hours. HbA1C: No results for input(s): HGBA1C in the last 72 hours. CBG: No results for input(s): GLUCAP in the last 168 hours. Lipid Profile: No results for input(s): CHOL, HDL, LDLCALC, TRIG, CHOLHDL, LDLDIRECT in the last 72 hours. Thyroid Function Tests: No results for input(s): TSH, T4TOTAL, FREET4, T3FREE, THYROIDAB in the last 72 hours. Anemia Panel: No results for input(s): VITAMINB12, FOLATE, FERRITIN, TIBC, IRON, RETICCTPCT in the last 72 hours. Urine analysis: No results found for: COLORURINE, APPEARANCEUR, LABSPEC, PHURINE, GLUCOSEU, HGBUR, BILIRUBINUR, KETONESUR, PROTEINUR, UROBILINOGEN, NITRITE, LEUKOCYTESUR Sepsis Labs: @LABRCNTIP (procalcitonin:4,lacticidven:4) )No results found for this or any previous visit (from the past 240 hour(s)).   Radiological Exams on Admission: CT KNEE LEFT W CONTRAST  Result Date: 10/23/2021 CLINICAL DATA:  Soft tissue infection redness and pain EXAM: CT OF THE left KNEE WITH CONTRAST TECHNIQUE: Multidetector CT imaging was performed following the standard protocol during bolus administration of intravenous contrast. CONTRAST:  42mL OMNIPAQUE IOHEXOL 350 MG/ML SOLN COMPARISON:  Radiograph 10/23/2021 FINDINGS: Bones/Joint/Cartilage No fracture or malalignment. Mild medial joint space degenerative change. Trace knee effusion is present. Ligaments Suboptimally assessed by CT. Muscles and Tendons No intramuscular fluid collections. Quadriceps and patellar tendons appear intact. Soft tissues Significant soft tissue swelling of the anterior knee. Rim enhancing fluid collection extends from the level of the lower pole patella, superficial to the patellar tendon, to the level of the tibial tuberosity. Fluid collection measures approximately 6.1 cm craniocaudad by 1 cm AP by 2.7 cm transverse. IMPRESSION: 1. Rim enhancing fluid collection measuring 6.1 x 1 x 2.7 cm within the soft  tissues anterior to the knee, extending from the level of lower patella to the tibial tuberosity. Findings could be secondary to soft tissue abscess versus bursitis of inflammatory or infectious etiology. Aspiration could be considered for further evaluation. 2. No acute osseous abnormality. Electronically Signed   By: 10/25/2021 M.D.   On: 10/23/2021 18:56   DG Knee Complete 4 Views Left  Result Date: 10/23/2021 CLINICAL DATA:  Cellulitis EXAM: LEFT KNEE - COMPLETE 4+ VIEW COMPARISON:  Left knee x-ray 10/19/2021 FINDINGS: No acute fracture or dislocation identified. No bone destruction or periosteal reaction. Mild tricompartmental joint space narrowing. No joint effusion visualized. Anterior/prepatellar soft tissue swelling which has increased since previous study. Arterial vascular calcifications again noted. IMPRESSION: Significant anterior/prepatellar soft tissue swelling which has increased since previous study. No acute osseous abnormality identified. Electronically Signed   By: 10/21/2021 M.D.   On: 10/23/2021 12:56      Assessment/Plan Principal Problem:   Cellulitis of knee Active Problems:   Uncontrolled type 2 diabetes mellitus with hyperglycemia (HCC)    Rim-enhancing lesion around the anterior aspect of the left knee concerning for soft tissue abscess versus infected bursitis for which aspiration has been done and labs are pending.  Patient has been empirically started antibiotics.  Dr. 10/25/2021 orthopedic surgeon has  been consulted.  We will keep patient n.p.o. after midnight in case patient needs procedure. History of diabetes mellitus type 2 with hyperglycemia has not been taking medications.  We will keep patient on sliding scale coverage for now every 4 hours may change to long-acting insulin once if procedures not planned.  Follow metabolic panel.  Check hemoglobin A1c. Elevated blood pressure and has been seen to have elevated blood pressure reading even during 3 days  ago.  Since patient has significant elevated blood pressure we will treat this as hypertensive urgency with new diagnosis of hypertension.  Started patient on lisinopril since patient is diabetic.  We will keep patient n.p.o. and IV hydralazine.   DVT prophylaxis: SCDs.  Avoiding anticoagulation anticipation of procedure. Code Status: Full code. Family Communication: Patient's son. Disposition Plan: Home when stable. Consults called: Orthopedics. Admission status: Observation.   Eduard Clos MD [facility] Pager [phone].  If 7PM-7AM, please contact night-coverage [email or web address] Password [facility]  10/23/2021, 9:06 PM

## 2021-10-23 NOTE — Progress Notes (Signed)
Pharmacy Antibiotic Note  Patrick [name] is a 75 y.o. male admitted on 10/23/2021 with cellulitis.  Pharmacy has been consulted for vancomycin and cefepime dosing. Patient presenting with worsening left knee pain, redness, and swelling. Patient seen on 12/23 for same -- started on doxycycline.  SCr 0.7 - at baseline WBC 8.6; T 99.1 F  Plan: Zosyn x1 given in the ED Cefepime 2g q12hr Vancomycin 1500 mg once then 1000 mg q36hr (eAUC 468) unless change in renal function Trend WBC, Fever, Renal function, & Clinical course F/u cultures, clinical course, WBC, fever De-escalate when able Levels at steady state     Temp (24hrs), Avg:99.1 F (37.3 C), Min:99.1 F (37.3 C), Max:99.1 F (37.3 C)  Recent Labs  Lab 10/19/21 1209 10/23/21 1231  WBC 8.8 8.6  CREATININE 0.71 0.70    CrCl cannot be calculated (Unknown ideal weight.).    No Known Allergies  Antimicrobials this admission: Zosyn 12/27 x1 in ED Cefepime 12/27 >>  Vancomycin 12/27 >>  Microbiology results: Pending  Thank you for allowing pharmacy to be a part of this patients care.  Delmar Landau, PharmD, BCPS 10/23/2021 9:22 PM ED Clinical Pharmacist -  [phone]

## 2021-10-23 NOTE — ED Provider Notes (Signed)
Emergency Medicine Provider Triage Evaluation Note  Patrick [name] , a 75 y.o. male  was evaluated in triage.  Pt complains of progressive worsening left knee pain, redness, and swelling.  Was seen on 12/23 and diagnosed with cellulitis of the left knee.  History of diabetes that is not treated.  He has been compliant with doxycycline according to the patient and his daughter at the bedside but presentation appears to be worsening.    Review of Systems  Positive: Left knee pain, redness, swelling Negative: Fevers, chills, nausea, vomiting  Physical Exam  BP (!) 178/127    Pulse 96    Temp 99.1 F (37.3 C) (Oral)    Resp 16    SpO2 98%  Gen:   Awake, no distress   Resp:  Normal effort  MSK:   Moves extremities without difficulty  Other:  Erythema, induration, and exquisite tenderness palpation over the anterior knee extending medially and laterally.  Patella moves freely.  Normal PT pulses bilaterally.  Medical Decision Making  Medically screening exam initiated at 12:25 PM.  Appropriate orders placed.  Patrick [name] was informed that the remainder of the evaluation will be completed by another provider, this initial triage assessment does not replace that evaluation, and the importance of remaining in the ED until their evaluation is complete.  Concern for progressively worsening cellulitis in context of immunocompromise patient not treated for his diabetes.  Work-up ordered.  This chart was dictated using voice recognition software, Dragon. Despite the best efforts of this provider to proofread and correct errors, errors may still occur which can change documentation meaning.    Sherrilee Gilles 10/23/21 1235    Pollyann Savoy, MD 10/23/21 1434

## 2021-10-23 NOTE — ED Provider Notes (Addendum)
[facility] EMERGENCY DEPARTMENT Provider Note   CSN: [id number] Arrival date & time: 10/23/21  1120     History Chief Complaint  Patient presents with   Knee Pain    Patrick [name] is a 75 y.o. male.  Patrick [name] is a 75 y.o. male with hx of diabetes, who returns with worsening redness and swelling of the left knee. Pt was seen initially for this in 12/24, diagnosed with cellulitis. Pt and daughter report they have been taking prescribed doxycycline, but despite this redness, swelling and pain have worsened. Daughter reports pt less willing to bear weight on the knee. Has had some chills, but no noted fevers. No nausea or vomiting. Pt reports he does not feel well. No new injury to the area. Pt did not report any initial injury or skin wound. Pt with poorly controlled diabetes.  The history is provided by the patient and a relative. The history is limited by a language barrier. A language interpreter was used.      Past Medical History:  Diagnosis Date   Diabetes mellitus without complication [facility])     Patient Active Problem List   Diagnosis Date Noted   Cellulitis of knee 10/23/2021   Uncontrolled type 2 diabetes mellitus with hyperglycemia (HCC) 10/23/2021    History reviewed. No pertinent surgical history.     Family History  Family history unknown: Yes    Social History   Tobacco Use   Smoking status: Never  Substance Use Topics   Alcohol use: Yes   Drug use: No    Home Medications Prior to Admission medications   Medication Sig Start Date End Date Taking? Authorizing Provider  doxycycline (VIBRAMYCIN) 100 MG capsule Take 1 capsule (100 mg total) by mouth 2 (two) times daily. 10/19/21  Yes Eber Hong, MD  ibuprofen (ADVIL) 200 MG tablet Take 200-400 mg by mouth every 6 (six) hours as needed for mild pain.   Yes [provider]    Allergies    Patient has no known allergies.  Review of Systems   Review of  Systems  Constitutional:  Positive for chills. Negative for fever.  Gastrointestinal:  Negative for nausea and vomiting.  Musculoskeletal:  Positive for arthralgias, joint swelling and myalgias.  Skin:  Positive for color change. Negative for wound.  Neurological:  Negative for weakness and numbness.  All other systems reviewed and are negative.  Physical Exam Updated Vital Signs BP (!) 178/127    Pulse 96    Temp 99.1 F (37.3 C) (Oral)    Resp 16    SpO2 98%   Physical Exam Vitals and nursing note reviewed.  Constitutional:      General: He is not in acute distress.    Appearance: Normal appearance. He is well-developed. He is not ill-appearing or diaphoretic.  HENT:     Head: Normocephalic and atraumatic.  Eyes:     General:        Right eye: No discharge.        Left eye: No discharge.  Cardiovascular:     Rate and Rhythm: Normal rate and regular rhythm.     Pulses: Normal pulses.     Heart sounds: Normal heart sounds.  Pulmonary:     Effort: Pulmonary effort is normal. No respiratory distress.     Breath sounds: Normal breath sounds. No wheezing or rales.     Comments: Respirations equal and unlabored, patient able to speak in full sentences, lungs clear to auscultation  bilaterally  Abdominal:     General: Bowel sounds are normal. There is no distension.     Palpations: Abdomen is soft. There is no mass.     Tenderness: There is no abdominal tenderness. There is no guarding.     Comments: Abdomen soft, nondistended, nontender to palpation in all quadrants without guarding or peritoneal signs  Musculoskeletal:        General: Swelling and tenderness present.     Cervical back: Neck supple.     Comments: Left  knee with redness and swelling over the anterior aspect. Redness is not circumferential. There is prepatellar fluctuance, but there in not swelling or tenderness surrounding the entire joint. No posterior tenderness. Pt is able to range the knee with some discomfort.  Redness does not extend beyond the  knee. Distal pulses 2+  Skin:    General: Skin is warm and dry.     Capillary Refill: Capillary refill takes less than 2 seconds.     Findings: Erythema present.  Neurological:     Mental Status: He is alert and oriented to person, place, and time.     Coordination: Coordination normal.     Comments: Speech is clear, able to follow commands Moves extremities without ataxia, coordination intact  Psychiatric:        Mood and Affect: Mood normal.        Behavior: Behavior normal.    ED Results / Procedures / Treatments   Labs (all labs ordered are listed, but only abnormal results are displayed) Labs Reviewed  BASIC METABOLIC PANEL - Abnormal; Notable for the following components:      Result Value   Glucose, Bld 383 (*)    All other components within normal limits  SEDIMENTATION RATE - Abnormal; Notable for the following components:   Sed Rate 115 (*)    All other components within normal limits  C-REACTIVE PROTEIN - Abnormal; Notable for the following components:   CRP 15.1 (*)    All other components within normal limits  BODY FLUID CULTURE W GRAM STAIN  CULTURE, BLOOD (ROUTINE X 2)  CULTURE, BLOOD (ROUTINE X 2)  RESP PANEL BY RT-PCR (FLU A&B, COVID) ARPGX2  CBC WITH DIFFERENTIAL/PLATELET  HEMOGLOBIN [id number]  BASIC METABOLIC PANEL  CBC  SYNOVIAL CELL COUNT + DIFF, W/ CRYSTALS    EKG None  Radiology CT KNEE LEFT W CONTRAST  Result Date: 10/23/2021 CLINICAL DATA:  Soft tissue infection redness and pain EXAM: CT OF THE left KNEE WITH CONTRAST TECHNIQUE: Multidetector CT imaging was performed following the standard protocol during bolus administration of intravenous contrast. CONTRAST:  73mL OMNIPAQUE IOHEXOL 350 MG/ML SOLN COMPARISON:  Radiograph 10/23/2021 FINDINGS: Bones/Joint/Cartilage No fracture or malalignment. Mild medial joint space degenerative change. Trace knee effusion is present. Ligaments Suboptimally assessed by CT. Muscles  and Tendons No intramuscular fluid collections. Quadriceps and patellar tendons appear intact. Soft tissues Significant soft tissue swelling of the anterior knee. Rim enhancing fluid collection extends from the level of the lower pole patella, superficial to the patellar tendon, to the level of the tibial tuberosity. Fluid collection measures approximately 6.1 cm craniocaudad by 1 cm AP by 2.7 cm transverse. IMPRESSION: 1. Rim enhancing fluid collection measuring 6.1 x 1 x 2.7 cm within the soft tissues anterior to the knee, extending from the level of lower patella to the tibial tuberosity. Findings could be secondary to soft tissue abscess versus bursitis of inflammatory or infectious etiology. Aspiration could be considered for further evaluation. 2. No  acute osseous abnormality. Electronically Signed   By: Donavan Foil M.D.   On: 10/23/2021 18:56   DG Knee Complete 4 Views Left  Result Date: 10/23/2021 CLINICAL DATA:  Cellulitis EXAM: LEFT KNEE - COMPLETE 4+ VIEW COMPARISON:  Left knee x-ray 10/19/2021 FINDINGS: No acute fracture or dislocation identified. No bone destruction or periosteal reaction. Mild tricompartmental joint space narrowing. No joint effusion visualized. Anterior/prepatellar soft tissue swelling which has increased since previous study. Arterial vascular calcifications again noted. IMPRESSION: Significant anterior/prepatellar soft tissue swelling which has increased since previous study. No acute osseous abnormality identified. Electronically Signed   By: Ofilia Neas M.D.   On: 10/23/2021 12:56    Procedures Aspiration of blood/fluid  Date/Time: 11/02/2021 9:34 AM Performed by: Jacqlyn Larsen, PA-C Authorized by: Jacqlyn Larsen, PA-C  Consent: Verbal consent obtained. Consent given by: patient Patient understanding: patient states understanding of the procedure being performed Imaging studies: imaging studies available Patient identity confirmed: verbally with  patient Preparation: Patient was prepped and draped in the usual sterile fashion. Local anesthesia used: yes  Anesthesia: Local anesthesia used: yes Local Anesthetic: topical anesthetic  Sedation: Patient sedated: no  Patient tolerance: patient tolerated the procedure well with no immediate complications Comments: Aspiration of prepatellar fluid collection tolerated well by patient, collected ~ 3 ml of bloody fluid with needle aspiration     Medications Ordered in ED Medications  vancomycin (VANCOREADY) IVPB 1500 mg/300 mL (1,500 mg Intravenous New Bag/Given 10/23/21 2230)  insulin aspart (novoLOG) injection 0-9 Units (has no administration in time range)  acetaminophen (TYLENOL) tablet 650 mg (has no administration in time range)    Or  acetaminophen (TYLENOL) suppository 650 mg (has no administration in time range)  vancomycin (VANCOCIN) IVPB 1000 mg/200 mL premix (has no administration in time range)  ceFEPIme (MAXIPIME) 2 g in sodium chloride 0.9 % 100 mL IVPB (has no administration in time range)  iohexol (OMNIPAQUE) 350 MG/ML injection 75 mL (75 mLs Intravenous Contrast Given 10/23/21 1822)  pentafluoroprop-tetrafluoroeth (GEBAUERS) aerosol ( Topical Given 10/23/21 2051)  piperacillin-tazobactam (ZOSYN) IVPB 3.375 g (0 g Intravenous Stopped 10/23/21 2149)  HYDROcodone-acetaminophen (NORCO/VICODIN) 5-325 MG per tablet 1 tablet (1 tablet Oral Given 10/23/21 2103)  sodium chloride 0.9 % bolus 500 mL (500 mLs Intravenous New Bag/Given 10/23/21 2112)    ED Course  I have reviewed the triage vital signs and the nursing notes.  Pertinent labs & imaging results that were available during my care of the patient were reviewed by me and considered in my medical decision making (see chart for details).    MDM Rules/Calculators/A&P                          Patrick [name] is a 75 y.o. male presents to the ED for concern of worsening cellulitis of the left knee, this involves an  extensive number of treatment options, and is a complaint that carries with it a high risk of complications and morbidity.  The differential diagnosis includes worsening cellulitis, soft tissue abscess, septic bursitis, septic joint   Additional history obtained:  Additional history obtained from daughter and son at bedside External records from outside source obtained and reviewed via EMR   Lab Tests:  I Ordered, reviewed, and interpreted labs.  The pertinent results include: No leukocytosis, glucose of 383 but no other electrolyte derangements and normal renal function.  CRP elevated at 15.1, sed rate of 115.  Blood cultures pending.   Imaging  Studies ordered:  I ordered imaging studies including plain films of the left knee as well as CT with contrast I independently visualized and interpreted imaging which showed increased prepatellar swelling on x-ray but no acute osseous abnormality or evidence of joint effusion.  CT with a 6.1 x 1 x 2.7 cm rim-enhancing fluid collection within the soft tissues anterior to the knee extending from the lower patella to the tibial tuberosity.  This could be soft tissue abscess versus infectious bursitis.  No acute osseous abnormality or evidence of joint effusion. I agree with the radiologist interpretation    ED Course:  Patient with worsening redness over the left knee despite taking antibiotics over the past 4 days, worsening prepatellar swelling, patient is still able to range the knee with some discomfort but does not have circumferential redness or tenderness over the knee joint. CT with prepatellar fluid collection, suspect bursitis, will discuss with orthopedics   Consultations Obtained:  I requested consultation with orthopedics,  and discussed lab and imaging findings as well as pertinent plan with Dr. Lyla Glassing who recommends bedside aspiration of prepatellar fluid collection, once fluid collection has been obtained can start patient on  broad-spectrum IV antibiotics and admit to medicine.  Orthopedics will see patient tomorrow morning to determine whether washout or other surgical intervention is needed Case discussed with Dr. Hal Hope with [facility] hospitalist who will see and admit the patient   Medications:  Patient started on IV Zosyn and vancomycin for cellulitis and prepatellar fluid collection concerning for infectious bursitis.  Given IV fluids for hyperglycemia and p.o. pain medication.   Dispostion:  After consideration of the diagnostic results and the patients response to treatment feel that the patent would benefit from admission.      Final Clinical Impression(s) / ED Diagnoses Final diagnoses:  Cellulitis of knee, left  Prepatellar bursitis of left knee    Rx / DC Orders ED Discharge Orders     None        Janet Berlin 10/23/21 2314    Noemi Chapel, MD 10/24/21 2357      Jacqlyn Larsen, PA-C 11/02/21 [id number]    Noemi Chapel, MD 11/10/21 [phone]

## 2021-10-24 ENCOUNTER — Encounter (HOSPITAL_COMMUNITY)
Admission: EM | Disposition: A | Discharge status: Home / Self Care | Payer: Self-pay | Source: Home / Self Care | Attending: Internal Medicine

## 2021-10-24 ENCOUNTER — Observation Stay (HOSPITAL_COMMUNITY): Payer: Self-pay | Admitting: Anesthesiology

## 2021-10-24 ENCOUNTER — Encounter (HOSPITAL_COMMUNITY): Payer: Self-pay | Admitting: Internal Medicine

## 2021-10-24 DIAGNOSIS — I10 Essential (primary) hypertension: Secondary | ICD-10-CM

## 2021-10-24 DIAGNOSIS — L03116 Cellulitis of left lower limb: Secondary | ICD-10-CM

## 2021-10-24 DIAGNOSIS — M009 Pyogenic arthritis, unspecified: Secondary | ICD-10-CM | POA: Diagnosis present

## 2021-10-24 HISTORY — PX: I & D EXTREMITY: SHX5045

## 2021-10-24 LAB — BASIC METABOLIC PANEL
Anion gap: 8 (ref 5–15)
BUN: 15 mg/dL (ref 8–23)
CO2: 24 mmol/L (ref 22–32)
Calcium: 8.7 mg/dL — ABNORMAL LOW (ref 8.9–10.3)
Chloride: 106 mmol/L (ref 98–111)
Creatinine, Ser: 0.48 mg/dL — ABNORMAL LOW (ref 0.61–1.24)
GFR, Estimated: >60 mL/min (ref >60)
Glucose, Bld: 227 mg/dL — ABNORMAL HIGH (ref 70–99)
Potassium: 3.5 mmol/L (ref 3.5–5.1)
Sodium: 138 mmol/L (ref 135–145)

## 2021-10-24 LAB — GLUCOSE, CAPILLARY
Glucose-Capillary: 198 mg/dL — ABNORMAL HIGH (ref 70–99)
Glucose-Capillary: 207 mg/dL — ABNORMAL HIGH (ref 70–99)
Glucose-Capillary: 198 mg/dL — ABNORMAL HIGH (ref 70–99)
Glucose-Capillary: 198 mg/dL — ABNORMAL HIGH (ref 70–99)
Glucose-Capillary: 209 mg/dL — ABNORMAL HIGH (ref 70–99)
Glucose-Capillary: 220 mg/dL — ABNORMAL HIGH (ref 70–99)
Glucose-Capillary: 242 mg/dL — ABNORMAL HIGH (ref 70–99)
Glucose-Capillary: 266 mg/dL — ABNORMAL HIGH (ref 70–99)

## 2021-10-24 LAB — CBC
HCT: 36.4 % — ABNORMAL LOW (ref 39.0–52.0)
Hemoglobin: 11.3 g/dL — ABNORMAL LOW (ref 13.0–17.0)
MCH: 27.3 pg (ref 26.0–34.0)
MCHC: 31 g/dL (ref 30.0–36.0)
MCV: 87.9 fL (ref 80.0–100.0)
Platelets: 204 10*3/uL (ref 150–400)
RBC: 4.14 MIL/uL — ABNORMAL LOW (ref 4.22–5.81)
RDW: 13.2 % (ref 11.5–15.5)
WBC: 7.9 10*3/uL (ref 4.0–10.5)
nRBC: 0 % (ref 0.0–0.2)

## 2021-10-24 LAB — HEMOGLOBIN A1C
Hgb A1c MFr Bld: 12.5 % — ABNORMAL HIGH (ref 4.8–5.6)
Mean Plasma Glucose: 312.05 mg/dL

## 2021-10-24 LAB — PATHOLOGIST SMEAR REVIEW

## 2021-10-24 LAB — CBG MONITORING, ED: Glucose-Capillary: 232 mg/dL — ABNORMAL HIGH (ref 70–99)

## 2021-10-24 LAB — SURGICAL PCR SCREEN
MRSA, PCR: NEGATIVE
Staphylococcus aureus: NEGATIVE

## 2021-10-24 SURGERY — IRRIGATION AND DEBRIDEMENT EXTREMITY
Anesthesia: General | Laterality: Left

## 2021-10-24 MED ORDER — ORAL CARE MOUTH RINSE: 15.0000 mL | Freq: Once | OROMUCOSAL | Status: AC

## 2021-10-24 MED ORDER — LACTATED RINGERS IV SOLN: INTRAVENOUS | Status: DC

## 2021-10-24 MED ORDER — HYDRALAZINE HCL 20 MG/ML IJ SOLN: 10.0000 mg | INTRAMUSCULAR | Status: DC | PRN

## 2021-10-24 MED ORDER — MUPIROCIN 2 % EX OINT
1.0000 "application " | TOPICAL_OINTMENT | Freq: Two times a day (BID) | CUTANEOUS | Status: DC
  Administered 2021-10-24 – 2021-10-26 (×3): 1 via NASAL
  Filled 2021-10-24 (×2): qty 22

## 2021-10-24 MED ORDER — FENTANYL CITRATE PF 50 MCG/ML IJ SOSY
50.0000 ug | PREFILLED_SYRINGE | Freq: Once | INTRAMUSCULAR | Status: DC
Start: 2021-10-24 — End: 2021-10-24

## 2021-10-24 MED ORDER — ACETAMINOPHEN 500 MG PO TABS
1000.0000 mg | ORAL_TABLET | Freq: Once | ORAL | Status: AC
  Administered 2021-10-24: 12:00:00 1000 mg via ORAL

## 2021-10-24 MED ORDER — CLONIDINE HCL (ANALGESIA) 100 MCG/ML EP SOLN
EPIDURAL | Status: DC | PRN
  Administered 2021-10-24: 70 ug

## 2021-10-24 MED ORDER — FENTANYL CITRATE (PF) 250 MCG/5ML IJ SOLN
INTRAMUSCULAR | Status: DC | PRN
  Administered 2021-10-24 (×2): 50 ug via INTRAVENOUS

## 2021-10-24 MED ORDER — ACETAMINOPHEN 500 MG PO TABS
ORAL_TABLET | ORAL | Status: AC
  Filled 2021-10-24: qty 2

## 2021-10-24 MED ORDER — LIDOCAINE 2% (20 MG/ML) 5 ML SYRINGE
INTRAMUSCULAR | Status: DC | PRN
  Administered 2021-10-24: 60 mg via INTRAVENOUS

## 2021-10-24 MED ORDER — LIVING WELL WITH DIABETES BOOK - IN SPANISH
Freq: Once | Status: AC
  Filled 2021-10-24: qty 1

## 2021-10-24 MED ORDER — ATORVASTATIN CALCIUM 40 MG PO TABS
40.0000 mg | ORAL_TABLET | Freq: Every day | ORAL | Status: DC
  Administered 2021-10-25 – 2021-10-26 (×2): 40 mg via ORAL
  Filled 2021-10-24 (×2): qty 1

## 2021-10-24 MED ORDER — PHENYLEPHRINE 40 MCG/ML (10ML) SYRINGE FOR IV PUSH (FOR BLOOD PRESSURE SUPPORT)
PREFILLED_SYRINGE | INTRAVENOUS | Status: DC | PRN
  Administered 2021-10-24: 80 ug via INTRAVENOUS

## 2021-10-24 MED ORDER — ONDANSETRON HCL 4 MG/2ML IJ SOLN
INTRAMUSCULAR | Status: DC | PRN
  Administered 2021-10-24: 4 mg via INTRAVENOUS

## 2021-10-24 MED ORDER — ONDANSETRON HCL 4 MG/2ML IJ SOLN
INTRAMUSCULAR | Status: AC
  Filled 2021-10-24: qty 2

## 2021-10-24 MED ORDER — CHLORHEXIDINE GLUCONATE 0.12 % MT SOLN
15.0000 mL | Freq: Once | OROMUCOSAL | Status: AC
  Filled 2021-10-24: qty 15

## 2021-10-24 MED ORDER — HYDROMORPHONE HCL 1 MG/ML IJ SOLN
0.2500 mg | INTRAMUSCULAR | Status: DC | PRN
  Administered 2021-10-24: 14:00:00 0.5 mg via INTRAVENOUS

## 2021-10-24 MED ORDER — DEXAMETHASONE SODIUM PHOSPHATE 4 MG/ML IJ SOLN
INTRAMUSCULAR | Status: DC | PRN
  Administered 2021-10-24: 2 mg via PERINEURAL

## 2021-10-24 MED ORDER — PHENYLEPHRINE 40 MCG/ML (10ML) SYRINGE FOR IV PUSH (FOR BLOOD PRESSURE SUPPORT)
PREFILLED_SYRINGE | INTRAVENOUS | Status: AC
  Filled 2021-10-24: qty 10

## 2021-10-24 MED ORDER — CEFAZOLIN SODIUM-DEXTROSE 2-4 GM/100ML-% IV SOLN
INTRAVENOUS | Status: AC
  Filled 2021-10-24: qty 100

## 2021-10-24 MED ORDER — HYDROMORPHONE HCL 1 MG/ML IJ SOLN
INTRAMUSCULAR | Status: AC
  Filled 2021-10-24: qty 1

## 2021-10-24 MED ORDER — LISINOPRIL 10 MG PO TABS
10.0000 mg | ORAL_TABLET | Freq: Every day | ORAL | Status: DC
  Administered 2021-10-24: 03:00:00 10 mg via ORAL
  Filled 2021-10-24: qty 1

## 2021-10-24 MED ORDER — PROPOFOL 10 MG/ML IV BOLUS
INTRAVENOUS | Status: DC | PRN
  Administered 2021-10-24: 30 mg via INTRAVENOUS
  Administered 2021-10-24: 20 mg via INTRAVENOUS
  Administered 2021-10-24: 110 mg via INTRAVENOUS

## 2021-10-24 MED ORDER — FENTANYL CITRATE (PF) 100 MCG/2ML IJ SOLN
INTRAMUSCULAR | Status: AC
  Administered 2021-10-24: 12:00:00 50 ug via INTRAVENOUS
  Filled 2021-10-24: qty 2

## 2021-10-24 MED ORDER — FENTANYL CITRATE (PF) 250 MCG/5ML IJ SOLN
INTRAMUSCULAR | Status: AC
  Filled 2021-10-24: qty 5

## 2021-10-24 MED ORDER — PROPOFOL 10 MG/ML IV BOLUS
INTRAVENOUS | Status: AC
  Filled 2021-10-24: qty 20

## 2021-10-24 MED ORDER — FENTANYL CITRATE (PF) 100 MCG/2ML IJ SOLN: 50.0000 ug | Freq: Once | INTRAMUSCULAR | Status: AC

## 2021-10-24 MED ORDER — VANCOMYCIN HCL IN DEXTROSE 1-5 GM/200ML-% IV SOLN
1000.0000 mg | INTRAVENOUS | Status: DC
  Administered 2021-10-24 – 2021-10-25 (×2): 1000 mg via INTRAVENOUS
  Filled 2021-10-24 (×3): qty 200

## 2021-10-24 MED ORDER — LISINOPRIL 20 MG PO TABS
20.0000 mg | ORAL_TABLET | Freq: Every day | ORAL | Status: DC
  Administered 2021-10-25 – 2021-10-26 (×2): 20 mg via ORAL
  Filled 2021-10-24 (×2): qty 1

## 2021-10-24 MED ORDER — CHLORHEXIDINE GLUCONATE 4 % EX LIQD: 60.0000 mL | Freq: Once | CUTANEOUS | Status: DC

## 2021-10-24 MED ORDER — POVIDONE-IODINE 10 % EX SWAB
2.0000 "application " | Freq: Once | CUTANEOUS | Status: AC
  Administered 2021-10-24: 2 via TOPICAL

## 2021-10-24 MED ORDER — CEFAZOLIN SODIUM-DEXTROSE 2-4 GM/100ML-% IV SOLN
2.0000 g | INTRAVENOUS | Status: AC
  Administered 2021-10-24: 12:00:00 2 g via INTRAVENOUS
  Filled 2021-10-24: qty 100

## 2021-10-24 MED ORDER — CHLORHEXIDINE GLUCONATE 0.12 % MT SOLN
OROMUCOSAL | Status: AC
  Administered 2021-10-24: 12:00:00 15 mL via OROMUCOSAL
  Filled 2021-10-24: qty 15

## 2021-10-24 MED ORDER — ROPIVACAINE HCL 7.5 MG/ML IJ SOLN
INTRAMUSCULAR | Status: DC | PRN
  Administered 2021-10-24: 20 mL via PERINEURAL

## 2021-10-24 MED ORDER — PROMETHAZINE HCL 25 MG/ML IJ SOLN
6.2500 mg | INTRAMUSCULAR | Status: DC | PRN
Start: 2021-10-24 — End: 2021-10-24

## 2021-10-24 MED ORDER — MIDAZOLAM HCL 2 MG/2ML IJ SOLN
INTRAMUSCULAR | Status: AC
  Filled 2021-10-24: qty 2

## 2021-10-24 MED ORDER — LIDOCAINE 2% (20 MG/ML) 5 ML SYRINGE
INTRAMUSCULAR | Status: AC
  Filled 2021-10-24: qty 5

## 2021-10-24 SURGICAL SUPPLY — 73 items
ALCOHOL 70% 16 OZ (MISCELLANEOUS) ×3 IMPLANT
BAG COUNTER SPONGE SURGICOUNT (BAG) ×2 IMPLANT
BAG SURGICOUNT SPONGE COUNTING (BAG) ×1
BLADE SURG 10 STRL SS (BLADE) ×3 IMPLANT
BNDG COHESIVE 1X5 TAN STRL LF (GAUZE/BANDAGES/DRESSINGS) IMPLANT
BNDG COHESIVE 4X5 TAN STRL (GAUZE/BANDAGES/DRESSINGS) ×3 IMPLANT
BNDG COHESIVE 6X5 TAN STRL LF (GAUZE/BANDAGES/DRESSINGS) ×4 IMPLANT
BNDG CONFORM 3 STRL LF (GAUZE/BANDAGES/DRESSINGS) IMPLANT
BNDG ELASTIC 3X5.8 VLCR STR LF (GAUZE/BANDAGES/DRESSINGS) IMPLANT
BNDG ELASTIC 6X10 VLCR STRL LF (GAUZE/BANDAGES/DRESSINGS) ×2 IMPLANT
BNDG GAUZE ELAST 4 BULKY (GAUZE/BANDAGES/DRESSINGS) ×3 IMPLANT
CANISTER WOUNDNEG PRESSURE 500 (CANNISTER) ×2 IMPLANT
CORD BIPOLAR FORCEPS 12FT (ELECTRODE) IMPLANT
COVER SURGICAL LIGHT HANDLE (MISCELLANEOUS) ×1 IMPLANT
CUFF TOURN SGL QUICK 24 (TOURNIQUET CUFF) ×3
CUFF TOURN SGL QUICK 34 (TOURNIQUET CUFF)
CUFF TOURN SGL QUICK 42 (TOURNIQUET CUFF) IMPLANT
CUFF TRNQT CYL 24X4X16.5-23 (TOURNIQUET CUFF) IMPLANT
CUFF TRNQT CYL 34X4.125X (TOURNIQUET CUFF) ×2 IMPLANT
DRAPE EXTREMITY BILATERAL (DRAPES) IMPLANT
DRAPE IMP U-DRAPE 54X76 (DRAPES) IMPLANT
DRAPE INCISE IOBAN 66X45 STRL (DRAPES) ×8 IMPLANT
DRAPE SURG 17X23 STRL (DRAPES) IMPLANT
DRAPE U-SHAPE 47X51 STRL (DRAPES) ×3 IMPLANT
DRESSING PEEL AND PLC PRVNA 13 (GAUZE/BANDAGES/DRESSINGS) IMPLANT
DRSG PAD ABDOMINAL 8X10 ST (GAUZE/BANDAGES/DRESSINGS) ×3 IMPLANT
DRSG PEEL AND PLACE PREVENA 13 (GAUZE/BANDAGES/DRESSINGS) ×3
DURAPREP 26ML APPLICATOR (WOUND CARE) ×3 IMPLANT
ELECT CAUTERY BLADE 6.4 (BLADE) ×3 IMPLANT
ELECT REM PT RETURN 9FT ADLT (ELECTROSURGICAL)
ELECTRODE REM PT RTRN 9FT ADLT (ELECTROSURGICAL) IMPLANT
FACESHIELD WRAPAROUND (MASK) IMPLANT
FACESHIELD WRAPAROUND OR TEAM (MASK) IMPLANT
GAUZE SPONGE 4X4 12PLY STRL (GAUZE/BANDAGES/DRESSINGS) ×4 IMPLANT
GAUZE XEROFORM 1X8 LF (GAUZE/BANDAGES/DRESSINGS) ×3 IMPLANT
GAUZE XEROFORM 5X9 LF (GAUZE/BANDAGES/DRESSINGS) ×1 IMPLANT
GLOVE SRG 8 PF TXTR STRL LF DI (GLOVE) ×2 IMPLANT
GLOVE SURG ENC MOIS LTX SZ7.5 (GLOVE) ×6 IMPLANT
GLOVE SURG UNDER POLY LF SZ8 (GLOVE) ×3
GOWN STRL REUS W/ TWL LRG LVL3 (GOWN DISPOSABLE) ×2 IMPLANT
GOWN STRL REUS W/ TWL XL LVL3 (GOWN DISPOSABLE) ×2 IMPLANT
GOWN STRL REUS W/TWL LRG LVL3 (GOWN DISPOSABLE)
GOWN STRL REUS W/TWL XL LVL3 (GOWN DISPOSABLE) ×3
HANDPIECE INTERPULSE COAX TIP (DISPOSABLE)
KIT BASIN OR (CUSTOM PROCEDURE TRAY) ×3 IMPLANT
KIT DRSG PREVENA PLUS 7DAY 125 (MISCELLANEOUS) ×2 IMPLANT
KIT TURNOVER KIT B (KITS) ×3 IMPLANT
MANIFOLD NEPTUNE II (INSTRUMENTS) ×1 IMPLANT
NS IRRIG 1000ML POUR BTL (IV SOLUTION) ×4 IMPLANT
PACK ORTHO EXTREMITY (CUSTOM PROCEDURE TRAY) ×3 IMPLANT
PAD ARMBOARD 7.5X6 YLW CONV (MISCELLANEOUS) ×6 IMPLANT
PADDING CAST ABS 4INX4YD NS (CAST SUPPLIES) ×4
PADDING CAST ABS COTTON 4X4 ST (CAST SUPPLIES) ×2 IMPLANT
PADDING CAST COTTON 6X4 STRL (CAST SUPPLIES) ×3 IMPLANT
SET CYSTO W/LG BORE CLAMP LF (SET/KITS/TRAYS/PACK) ×3 IMPLANT
SET HNDPC FAN SPRY TIP SCT (DISPOSABLE) IMPLANT
SPONGE T-LAP 18X18 ~~LOC~~+RFID (SPONGE) ×6 IMPLANT
STOCKINETTE IMPERVIOUS 9X36 MD (GAUZE/BANDAGES/DRESSINGS) ×3 IMPLANT
SUT ETHILON 2 0 FS 18 (SUTURE) ×6 IMPLANT
SUT ETHILON 2 0 PSLX (SUTURE) IMPLANT
SUT ETHILON 3 0 PS 1 (SUTURE) IMPLANT
SUT VIC AB 2-0 CT1 36 (SUTURE) IMPLANT
SUT VIC AB 2-0 FS1 27 (SUTURE) IMPLANT
SWAB CULTURE ESWAB REG 1ML (MISCELLANEOUS) ×2 IMPLANT
SYR CONTROL 10ML LL (SYRINGE) IMPLANT
TOWEL GREEN STERILE (TOWEL DISPOSABLE) ×3 IMPLANT
TOWEL GREEN STERILE FF (TOWEL DISPOSABLE) ×3 IMPLANT
TUBE CONNECTING 12'X1/4 (SUCTIONS) ×1
TUBE CONNECTING 12X1/4 (SUCTIONS) ×2 IMPLANT
TUBE FEEDING ENTERAL 5FR 16IN (TUBING) IMPLANT
UNDERPAD 30X36 HEAVY ABSORB (UNDERPADS AND DIAPERS) ×4 IMPLANT
WATER STERILE IRR 1000ML POUR (IV SOLUTION) ×3 IMPLANT
YANKAUER SUCT BULB TIP NO VENT (SUCTIONS) ×3 IMPLANT

## 2021-10-24 NOTE — Progress Notes (Signed)
Inpatient Diabetes Program Recommendations  AACE/ADA: New Consensus Statement on Inpatient Glycemic Control (2015)  Target Ranges:  Prepandial:   less than 140 mg/dL      Peak postprandial:   less than 180 mg/dL (1-2 hours)      Critically ill patients:  140 - 180 mg/dL   Lab Results  Component Value Date   GLUCAP 209 (H) 10/24/2021   HGBA1C 12.5 (H) 10/24/2021    Review of Glycemic Control  Latest Reference Range & Units 10/24/21 08:50 10/24/21 10:29 10/24/21 11:04 10/24/21 13:25 10/24/21 14:05  Glucose-Capillary 70 - 99 mg/dL 158 (H) 309 (H) 407 (H) 198 (H) 209 (H)  (H): Data is abnormally high  Diabetes history: DM2 Outpatient Diabetes medications: Metformin (not taking) Current orders for Inpatient glycemic control: Novolog 0-9 units TID  Inpatient Diabetes Program Recommendations:    Once eating please change Novolog 0-9 units to TID and 0-5 units QHS  Spoke with patient and daughter at bedside.  Reviewed patient's current A1c of 12.5% (average blood sugar of 312 mg/dl). Explained what a A1c is and what it measures. Also reviewed goal A1c with patient, importance of good glucose control @ home, and blood sugar goals.  Daughter states she stopped getting his Metformin from the pharmacy about a year ago due to cost.  The pharmacy was charging $15-20. Told her that Metformin is on the $4 list at [facility].    He drinks regular sodas and eats a lot of cookies.  If he could switch to diet drinks and limit his sweets he could likely get his blood sugar down.  Educated on The Plate Method and foods that contain CHO's.  Encouraged portion control.  Talked about fruits and how apples and berries have less sugar.  He has glucometer at home.    He has not required much insulin and given age; might consider adding Amaryl to DM regimen at discharge.  Discussed hypoglycemia, signs, symptoms and treatment.  TOC consult placed for PCP, medications assistance.  Will continue to follow  while inpatient.  Thank you, Dulce Sellar, RN, BSN Diabetes Coordinator Inpatient Diabetes Program [phone] (team pager from 8a-5p)

## 2021-10-24 NOTE — ED Notes (Signed)
CBG result [id number]

## 2021-10-24 NOTE — Anesthesia Procedure Notes (Signed)
Procedure Name: LMA Insertion Date/Time: 10/24/2021 12:25 PM Performed by: Mayer Camel, CRNA Pre-anesthesia Checklist: Patient identified, Emergency Drugs available, Suction available and Patient being monitored Patient Re-evaluated:Patient Re-evaluated prior to induction Oxygen Delivery Method: Circle System Utilized Preoxygenation: Pre-oxygenation with 100% oxygen Induction Type: IV induction LMA: LMA inserted LMA Size: 4.0 Number of attempts: 1 Airway Equipment and Method: Bite block Placement Confirmation: positive ETCO2 Tube secured with: Tape Dental Injury: Teeth and Oropharynx as per pre-operative assessment

## 2021-10-24 NOTE — Consult Note (Signed)
Reason for Consult:Left knee septic bursitis Referring Physician: Wendee [name] Time called: 0730 Time at bedside: [place]   Patrick [name] is an 75 y.o. male.  HPI: Patrick [name] began to have left knee pain shortly before 12/23. He was brought to the ED. He was diagnosed with a superficial infection and given doxycycline. He was brought back yesterday with worsening disease. Bursal aspiration and CT were both c/w septic bursitis. He was admitted and orthopedic surgery consulted. He does not work and lives with his daughter.  Past Medical History:  Diagnosis Date   Diabetes mellitus without complication ([place])     History reviewed. No pertinent surgical history.  Family History  Family history unknown: Yes    Social History:  reports that he has never smoked. He does not have any smokeless tobacco history on file. He reports current alcohol use. He reports that he does not use drugs.  Allergies: No Known Allergies  Medications: I have reviewed the patient's current medications.  Results for orders placed or performed during the hospital encounter of 10/23/21 (from the past 48 hour(s))  CBC with Differential     Status: None   Collection Time: 10/23/21 12:31 PM  Result Value Ref Range   WBC 8.6 4.0 - 10.5 K/uL   RBC 4.68 4.22 - 5.81 MIL/uL   Hemoglobin 13.0 13.0 - 17.0 g/dL   HCT 40.8 39.0 - 52.0 %   MCV 87.2 80.0 - 100.0 fL   MCH 27.8 26.0 - 34.0 pg   MCHC 31.9 30.0 - 36.0 g/dL   RDW 13.2 11.5 - 15.5 %   Platelets 245 150 - 400 K/uL   nRBC 0.0 0.0 - 0.2 %   Neutrophils Relative % 78 %   Neutro Abs 6.8 1.7 - 7.7 K/uL   Lymphocytes Relative 15 %   Lymphs Abs 1.3 0.7 - 4.0 K/uL   Monocytes Relative 6 %   Monocytes Absolute 0.5 0.1 - 1.0 K/uL   Eosinophils Relative 0 %   Eosinophils Absolute 0.0 0.0 - 0.5 K/uL   Basophils Relative 1 %   Basophils Absolute 0.0 0.0 - 0.1 K/uL   Immature Granulocytes 0 %   Abs Immature Granulocytes 0.03 0.00 - 0.07 K/uL    Comment: Performed at  [facility], [address] [place], [place] [id number]  Basic metabolic panel     Status: Abnormal   Collection Time: 10/23/21 12:31 PM  Result Value Ref Range   Sodium 136 135 - 145 mmol/L   Potassium 3.9 3.5 - 5.1 mmol/L   Chloride 102 98 - 111 mmol/L   CO2 23 22 - 32 mmol/L   Glucose, Bld 383 (H) 70 - 99 mg/dL    Comment: Glucose reference range applies only to samples taken after fasting for at least 8 hours.   BUN 20 8 - 23 mg/dL   Creatinine, Ser 0.70 0.61 - 1.24 mg/dL   Calcium 9.6 8.9 - 10.3 mg/dL   GFR, Estimated >60 >60 mL/min    Comment: (NOTE) Calculated using the CKD-EPI Creatinine Equation (2021)    Anion gap 11 5 - 15    Comment: Performed at [facility] [address] [place], [place] [address]  Sedimentation rate     Status: Abnormal   Collection Time: 10/23/21 12:31 PM  Result Value Ref Range   Sed Rate 115 (H) 0 - 16 mm/hr    Comment: Performed at [facility] [address] [place],  [address]  C-reactive protein     Status: Abnormal   Collection Time: 10/23/21 12:31 PM  Result Value Ref Range   CRP 15.1 (H) <1.0 mg/dL    Comment: Performed at [facility], [place] [address], [place], [place] [address]  Culture, blood (routine x 2)     Status: None (Preliminary result)   Collection Time: 10/23/21 12:35 PM   Specimen: BLOOD RIGHT ARM  Result Value Ref Range   Specimen Description BLOOD RIGHT ARM    Special Requests      BOTTLES DRAWN AEROBIC AND ANAEROBIC Blood Culture results may not be optimal due to an inadequate volume of blood received in culture bottles   Culture      NO GROWTH < 24 HOURS Performed at [facility], [place] [address] [place], [place] [address]    Report Status PENDING   Body fluid culture w Gram Stain     Status: None (Preliminary result)   Collection Time: 10/23/21  7:55 PM   Specimen: Body Fluid  Result Value Ref Range   Specimen Description FLUID    Special Requests LEFT KNEE BURSA     Gram Stain      ABUNDANT WBC PRESENT, PREDOMINANTLY PMN MODERATE GRAM POSITIVE COCCI    Culture      TOO YOUNG TO READ Performed at [facility], [address] [place], [place] [address]    Report Status PENDING   Synovial cell count + diff, w/ crystals     Status: Abnormal   Collection Time: 10/23/21  7:55 PM  Result Value Ref Range   Color, Synovial RED (A) YELLOW   Appearance-Synovial TURBID (A) CLEAR   Crystals, Fluid INTRACELLULAR CALCIUM PYROPHOSPHATE CRYSTALS     Comment: EXTRACELLULAR CALCIUM PYROPHOSPHATE CRYSTALS   WBC, Synovial UNABLE TO PERFORM COUNT DUE TO CLOT IN SPECIMEN 0 - 200 /cu mm   Other Cells-SYN      UNABLE TO PERFORM DIFFERENTIAL DUE CELLULAR DEGENERATION    Comment: Performed at [facility], [place] [address] [place], [place] [address]  Resp Panel by RT-PCR (Flu A&B, Covid) Nasopharyngeal Swab     Status: None   Collection Time: 10/23/21 10:23 PM   Specimen: Nasopharyngeal Swab; Nasopharyngeal(NP) swabs in vial transport medium  Result Value Ref Range   SARS Coronavirus 2 by RT PCR NEGATIVE NEGATIVE    Comment: (NOTE) SARS-CoV-2 target nucleic acids are NOT DETECTED.  The SARS-CoV-2 RNA is generally detectable in upper respiratory specimens during the acute phase of infection. The lowest concentration of SARS-CoV-2 viral copies this assay can detect is 138 copies/mL. A negative result does not preclude SARS-Cov-2 infection and should not be used as the sole basis for treatment or other patient management decisions. A negative result may occur with  improper specimen collection/handling, submission of specimen other than nasopharyngeal swab, presence of viral mutation(s) within the areas targeted by this assay, and inadequate number of viral copies(<138 copies/mL). A negative result must be combined with clinical observations, patient history, and epidemiological information. The expected result is Negative.  Fact Sheet for Patients:   EntrepreneurPulse.com.au  Fact Sheet for Healthcare Providers:  IncredibleEmployment.be  This test is no t yet approved or cleared by the [place] FDA and  has been authorized for detection and/or diagnosis of SARS-CoV-2 by FDA under an Emergency Use Authorization (EUA). This EUA will remain  in effect (meaning this test can be used) for the duration of the COVID-19 declaration under Section 564(b)(1) of  the Act, 21 U.S.C.section 360bbb-3(b)(1), unless the authorization is terminated  or revoked sooner.       Influenza A by PCR NEGATIVE NEGATIVE   Influenza B by PCR NEGATIVE NEGATIVE    Comment: (NOTE) The Xpert Xpress SARS-CoV-2/FLU/RSV plus assay is intended as an aid in the diagnosis of influenza from Nasopharyngeal swab specimens and should not be used as a sole basis for treatment. Nasal washings and aspirates are unacceptable for Xpert Xpress SARS-CoV-2/FLU/RSV testing.  Fact Sheet for Patients: EntrepreneurPulse.com.au  Fact Sheet for Healthcare Providers: IncredibleEmployment.be  This test is not yet approved or cleared by the [place] FDA and has been authorized for detection and/or diagnosis of SARS-CoV-2 by FDA under an Emergency Use Authorization (EUA). This EUA will remain in effect (meaning this test can be used) for the duration of the COVID-19 declaration under Section 564(b)(1) of the Act, 21 U.S.C. section 360bbb-3(b)(1), unless the authorization is terminated or revoked.  Performed at [facility], [place] [address] [place], [place] [address]   CBG monitoring, ED     Status: Abnormal   Collection Time: 10/23/21 11:54 PM  Result Value Ref Range   Glucose-Capillary 258 (H) 70 - 99 mg/dL    Comment: Glucose reference range applies only to samples taken after fasting for at least 8 hours.  CBG monitoring, ED     Status: Abnormal   Collection Time: 10/24/21  4:02 AM   Result Value Ref Range   Glucose-Capillary 232 (H) 70 - 99 mg/dL    Comment: Glucose reference range applies only to samples taken after fasting for at least 8 hours.   Comment 1 Notify RN    Comment 2 Document in Chart   Hemoglobin A1c     Status: Abnormal   Collection Time: 10/24/21  5:13 AM  Result Value Ref Range   Hgb A1c MFr Bld 12.5 (H) 4.8 - 5.6 %    Comment: (NOTE) Pre diabetes:          5.7%-6.4%  Diabetes:              >6.4%  Glycemic control for   <7.0% adults with diabetes    Mean Plasma Glucose 312.05 mg/dL    Comment: Performed at [facility] [address] [place], [place] [id number]  Basic metabolic panel     Status: Abnormal   Collection Time: 10/24/21  5:13 AM  Result Value Ref Range   Sodium 138 135 - 145 mmol/L   Potassium 3.5 3.5 - 5.1 mmol/L   Chloride 106 98 - 111 mmol/L   CO2 24 22 - 32 mmol/L   Glucose, Bld 227 (H) 70 - 99 mg/dL    Comment: Glucose reference range applies only to samples taken after fasting for at least 8 hours.   BUN 15 8 - 23 mg/dL   Creatinine, Ser 0.48 (L) 0.61 - 1.24 mg/dL   Calcium 8.7 (L) 8.9 - 10.3 mg/dL   GFR, Estimated >60 >60 mL/min    Comment: (NOTE) Calculated using the CKD-EPI Creatinine Equation (2021)    Anion gap 8 5 - 15    Comment: Performed at [facility] [address], [place] [address]  CBC     Status: Abnormal   Collection Time: 10/24/21  5:13 AM  Result Value Ref Range   WBC 7.9 4.0 - 10.5 K/uL   RBC 4.14 (L) 4.22 - 5.81 MIL/uL   Hemoglobin 11.3 (L) 13.0 - 17.0 g/dL  HCT 36.4 (L) 39.0 - 52.0 %   MCV 87.9 80.0 - 100.0 fL   MCH 27.3 26.0 - 34.0 pg   MCHC 31.0 30.0 - 36.0 g/dL   RDW 13.2 11.5 - 15.5 %   Platelets 204 150 - 400 K/uL   nRBC 0.0 0.0 - 0.2 %    Comment: Performed at [facility] [address] [place], [place] [address]  Glucose, capillary     Status: Abnormal   Collection Time: 10/24/21  8:50 AM  Result Value Ref Range   Glucose-Capillary 198 (H) 70  - 99 mg/dL    Comment: Glucose reference range applies only to samples taken after fasting for at least 8 hours.    CT KNEE LEFT W CONTRAST  Result Date: 10/23/2021 CLINICAL DATA:  Soft tissue infection redness and pain EXAM: CT OF THE left KNEE WITH CONTRAST TECHNIQUE: Multidetector CT imaging was performed following the standard protocol during bolus administration of intravenous contrast. CONTRAST:  94mL OMNIPAQUE IOHEXOL 350 MG/ML SOLN COMPARISON:  Radiograph 10/23/2021 FINDINGS: Bones/Joint/Cartilage No fracture or malalignment. Mild medial joint space degenerative change. Trace knee effusion is present. Ligaments Suboptimally assessed by CT. Muscles and Tendons No intramuscular fluid collections. Quadriceps and patellar tendons appear intact. Soft tissues Significant soft tissue swelling of the anterior knee. Rim enhancing fluid collection extends from the level of the lower pole patella, superficial to the patellar tendon, to the level of the tibial tuberosity. Fluid collection measures approximately 6.1 cm craniocaudad by 1 cm AP by 2.7 cm transverse. IMPRESSION: 1. Rim enhancing fluid collection measuring 6.1 x 1 x 2.7 cm within the soft tissues anterior to the knee, extending from the level of lower patella to the tibial tuberosity. Findings could be secondary to soft tissue abscess versus bursitis of inflammatory or infectious etiology. Aspiration could be considered for further evaluation. 2. No acute osseous abnormality. Electronically Signed   By: Donavan Foil M.D.   On: 10/23/2021 18:56   DG Knee Complete 4 Views Left  Result Date: 10/23/2021 CLINICAL DATA:  Cellulitis EXAM: LEFT KNEE - COMPLETE 4+ VIEW COMPARISON:  Left knee x-ray 10/19/2021 FINDINGS: No acute fracture or dislocation identified. No bone destruction or periosteal reaction. Mild tricompartmental joint space narrowing. No joint effusion visualized. Anterior/prepatellar soft tissue swelling which has increased since previous  study. Arterial vascular calcifications again noted. IMPRESSION: Significant anterior/prepatellar soft tissue swelling which has increased since previous study. No acute osseous abnormality identified. Electronically Signed   By: Ofilia Neas M.D.   On: 10/23/2021 12:56    Review of Systems  HENT:  Negative for ear discharge, ear pain, hearing loss and tinnitus.   Eyes:  Negative for photophobia and pain.  Respiratory:  Negative for cough and shortness of breath.   Cardiovascular:  Negative for chest pain.  Gastrointestinal:  Negative for abdominal pain, nausea and vomiting.  Genitourinary:  Negative for dysuria, flank pain, frequency and urgency.  Musculoskeletal:  Positive for arthralgias (Left knee). Negative for back pain, myalgias and neck pain.  Neurological:  Negative for dizziness and headaches.  Hematological:  Does not bruise/bleed easily.  Psychiatric/Behavioral:  The patient is not nervous/anxious.   Blood pressure (!) 175/83, pulse 78, temperature 98.5 F (36.9 C), temperature source Oral, resp. rate 16, height 4\' 10"  (1.473 m), weight 59 kg, SpO2 99 %. Physical Exam Constitutional:      General: He is not in acute distress.    Appearance: He is well-developed. He is not diaphoretic.  HENT:  Head: Normocephalic and atraumatic.  Eyes:     General: No scleral icterus.       Right eye: No discharge.        Left eye: No discharge.     Conjunctiva/sclera: Conjunctivae normal.  Cardiovascular:     Rate and Rhythm: Normal rate and regular rhythm.  Pulmonary:     Effort: Pulmonary effort is normal. No respiratory distress.  Musculoskeletal:     Cervical back: Normal range of motion.     Comments: LLE No traumatic wounds, ecchymosis, or rash  Mod TTP  No knee or ankle effusion  Sens DPN, SPN, TN intact  Motor EHL, ext, flex, evers 5/5  DP 1+, PT 0, No significant edema  Skin:    General: Skin is warm and dry.  Neurological:     Mental Status: He is alert.   Psychiatric:        Mood and Affect: Mood normal.        Behavior: Behavior normal.    Assessment/Plan: Left knee septic bursitis -- Plan I&D, poss bursectomy today with Dr. Sherilyn Dacosta. Please keep NPO.    Freeman Caldron, PA-C Orthopedic Surgery [phone] 10/24/2021, 8:56 AM

## 2021-10-24 NOTE — Anesthesia Procedure Notes (Signed)
Anesthesia Regional Block: Adductor canal block   Pre-Anesthetic Checklist: , timeout performed,  Correct Patient, Correct Site, Correct Laterality,  Correct Procedure, Correct Position, site marked,  Risks and benefits discussed,  Surgical consent,  Pre-op evaluation,  At surgeon's request and post-op pain management  Laterality: Lower and Left  Prep: chloraprep       Needles:  Injection technique: Single-shot  Needle Type: Stimiplex     Needle Length: 9cm  Needle Gauge: 21     Additional Needles:   Procedures:,,,, ultrasound used (permanent image in chart),,    Narrative:  Start time: 10/24/2021 11:21 AM End time: 10/24/2021 11:42 AM Injection made incrementally with aspirations every 5 mL.  Performed by: Personally  Anesthesiologist: Lewie Loron, MD  Additional Notes: BP cuff, EKG monitors applied. Sedation begun. Artery and nerve location verified with ultrasound. Anesthetic injected incrementally (3ml), slowly, and after negative aspirations under direct u/s guidance. Good fascial/perineural spread. Tolerated well.

## 2021-10-24 NOTE — Progress Notes (Addendum)
PROGRESS NOTE  Patrick [name] QIH:[id number] DOB: 1946-08-17   PCP: Pcp, No  Patient is from: Home  DOA: 10/23/2021 LOS: 0  Chief complaints:  Chief Complaint  Patient presents with   Knee Pain     Brief Narrative / Interim history: 75 year old M with PMH of DM-2 and HTN not on any medication presented to ED with left knee swelling and pain for about 1 week that has not improved with oral antibiotics, and admitted for cellulitis and possible bursitis vs abscess.    In ED, BP 178/127 on admit.  Afebrile but mild temp to 99.1.  CBC and CMP without significant finding other than hyperglycemia to 383.  CRP 15.1.  ESR 115.  CT showed rim-enhancing fluid collection measuring 6.1 x 1 x 2.7 cm within the soft tissue anterior to the knee concerning for abscess versus bursitis.  IR needle aspiration.  Fluid stated with intracellular CPP but unable to perform cell count due to clot.  Culture pending.   Started on cefepime vancomycin.  Orthopedic surgery consulted and following.  Subjective: Seen and examined earlier this morning.  Patient's daughter at bedside.  Reports left knee pain worse with movement and flexion.  Denies history of trauma or prior history of knee swelling.  Not on medication for his diabetes and hypertension.  Reports difficulty affording medications.  Has no PCP either.   Objective: Vitals:   10/23/21 2300 10/24/21 0235 10/24/21 0646 10/24/21 0841  BP:  (!) 158/87 (!) 179/81 (!) 175/83  Pulse:  80 74 78  Resp:  16 15 16   Temp:    98.5 F (36.9 C)  TempSrc:    Oral  SpO2:  98% 95% 99%  Weight: 59 kg     Height: 4' 10"  (1.473 m)       Examination:  GENERAL: No apparent distress.  Nontoxic. HEENT: MMM.  Vision and hearing grossly intact.  NECK: Supple.  No apparent JVD.  RESP:  No IWOB.  Fair aeration bilaterally. CVS:  RRR. Heart sounds normal.  ABD/GI/GU: BS+. Abd soft, NTND.  MSK/EXT: Left knee swelling and erythema.  Tenderness to palpation.  Very limited  ROM due to pain.  Knee effusion.  SKIN: no apparent skin lesion or wound NEURO: Awake, alert and oriented appropriately.  No apparent focal neuro deficit. PSYCH: Calm. Normal affect.   Procedures:  12/27-needle aspiration of anterior left knee fluid collection  Microbiology summarized: COVID-19 and influenza PCR nonreactive. Blood cultures NGTD. Aspiration culture pending.  Assessment & Plan: Left knee infection/cellulitis/abscess-failed outpatient treatment with oral antibiotics.  CT finding and exam as above.  Markedly elevated inflammatory markers.  Fluid study with CPP.  Cultures pending. -Continue IV cefepime and vancomycin -Needs I&D and possible washing -Consider adding colchicine after surgery -Follow cultures  Uncontrolled DM-2 with hyperglycemia: Not on medication at home.  Wasn't able to see PCP due to financial issues. Recent Labs  Lab 10/23/21 2354 10/24/21 0402 10/24/21 0850  GLUCAP 258* 232* 198*  -Continue SSI-sensitive while NPO -Start basal insulin after surgery -Start statin -Diabetic coordinator consulted  Uncontrolled hypertension: Not on medication at home. -Increase lisinopril to 20 mg daily -Continue hydralazine as needed  Social issues-patient's daughter reports difficulty affording medication or seeing a doctor. -TOC consulted for PCP and medication assistance  Body mass index is 27.17 kg/m.         DVT prophylaxis:  SCDs Start: 10/23/21 2105  Code Status: Full code Family Communication: Updated patient's daughter at bedside. Level of care: Telemetry Medical  Status is: Observation  The patient will require care spanning > 2 midnights and should be moved to inpatient because: Due to left knee infection requiring surgical intervention and IV antibiotics      Consultants:  Orthopedic surgery   Sch Meds:  Scheduled Meds:  insulin aspart  0-9 Units Subcutaneous Q4H   [START ON 10/25/2021] lisinopril  20 mg Oral Daily   mupirocin  ointment  1 application Nasal BID   Continuous Infusions:  ceFEPime (MAXIPIME) IV Stopped (10/24/21 0709)   vancomycin     PRN Meds:.acetaminophen **OR** acetaminophen, hydrALAZINE  Antimicrobials: Anti-infectives (From admission, onward)    Start     Dose/Rate Route Frequency Ordered Stop   10/25/21 1100  vancomycin (VANCOCIN) IVPB 1000 mg/200 mL premix  Status:  Discontinued        1,000 mg 200 mL/hr over 60 Minutes Intravenous Every 36 hours 10/23/21 2259 10/24/21 0757   10/24/21 2200  vancomycin (VANCOCIN) IVPB 1000 mg/200 mL premix        1,000 mg 200 mL/hr over 60 Minutes Intravenous Every 24 hours 10/24/21 0757     10/24/21 0500  ceFEPIme (MAXIPIME) 2 g in sodium chloride 0.9 % 100 mL IVPB  Status:  Discontinued        2 g 200 mL/hr over 30 Minutes Intravenous Every 8 hours 10/23/21 2112 10/23/21 2302   10/24/21 0500  ceFEPIme (MAXIPIME) 2 g in sodium chloride 0.9 % 100 mL IVPB        2 g 200 mL/hr over 30 Minutes Intravenous Every 12 hours 10/23/21 2302     10/23/21 2115  ceFEPIme (MAXIPIME) 2 g in sodium chloride 0.9 % 100 mL IVPB  Status:  Discontinued        2 g 200 mL/hr over 30 Minutes Intravenous  Once 10/23/21 2106 10/23/21 2112   10/23/21 2100  vancomycin (VANCOREADY) IVPB 1500 mg/300 mL        1,500 mg 150 mL/hr over 120 Minutes Intravenous  Once 10/23/21 2052 10/24/21 0138   10/23/21 2100  piperacillin-tazobactam (ZOSYN) IVPB 3.375 g        3.375 g 100 mL/hr over 30 Minutes Intravenous  Once 10/23/21 2052 10/23/21 2149        I have personally reviewed the following labs and images: CBC: Recent Labs  Lab 10/19/21 1209 10/23/21 1231 10/24/21 0513  WBC 8.8 8.6 7.9  NEUTROABS 6.3 6.8  --   HGB 13.6 13.0 11.3*  HCT 41.4 40.8 36.4*  MCV 85.4 87.2 87.9  PLT 215 245 204   BMP &GFR Recent Labs  Lab 10/19/21 1209 10/23/21 1231 10/24/21 0513  NA 131* 136 138  K 4.0 3.9 3.5  CL 100 102 106  CO2 21* 23 24  GLUCOSE 381* 383* 227*  BUN 16 20 15    CREATININE 0.71 0.70 0.48*  CALCIUM 9.3 9.6 8.7*   Estimated Creatinine Clearance: 59.1 mL/min (A) (by C-G formula based on SCr of 0.48 mg/dL (L)). Liver & Pancreas: No results for input(s): AST, ALT, ALKPHOS, BILITOT, PROT, ALBUMIN in the last 168 hours. No results for input(s): LIPASE, AMYLASE in the last 168 hours. No results for input(s): AMMONIA in the last 168 hours. Diabetic: Recent Labs    10/24/21 0513  HGBA1C 12.5*   Recent Labs  Lab 10/23/21 2354 10/24/21 0402 10/24/21 0850  GLUCAP 258* 232* 198*   Cardiac Enzymes: No results for input(s): CKTOTAL, CKMB, CKMBINDEX, TROPONINI in the last 168 hours. No results for input(s): PROBNP in the  last 8760 hours. Coagulation Profile: Recent Labs  Lab 10/19/21 1209  INR 1.1   Thyroid Function Tests: No results for input(s): TSH, T4TOTAL, FREET4, T3FREE, THYROIDAB in the last 72 hours. Lipid Profile: No results for input(s): CHOL, HDL, LDLCALC, TRIG, CHOLHDL, LDLDIRECT in the last 72 hours. Anemia Panel: No results for input(s): VITAMINB12, FOLATE, FERRITIN, TIBC, IRON, RETICCTPCT in the last 72 hours. Urine analysis: No results found for: COLORURINE, APPEARANCEUR, LABSPEC, PHURINE, GLUCOSEU, HGBUR, BILIRUBINUR, KETONESUR, PROTEINUR, UROBILINOGEN, NITRITE, LEUKOCYTESUR Sepsis Labs: Invalid input(s): PROCALCITONIN, [place]  Microbiology: Recent Results (from the past 240 hour(s))  Culture, blood (routine x 2)     Status: None (Preliminary result)   Collection Time: 10/23/21 12:35 PM   Specimen: BLOOD RIGHT ARM  Result Value Ref Range Status   Specimen Description BLOOD RIGHT ARM  Final   Special Requests   Final    BOTTLES DRAWN AEROBIC AND ANAEROBIC Blood Culture results may not be optimal due to an inadequate volume of blood received in culture bottles   Culture   Final    NO GROWTH < 24 HOURS Performed at [facility], [address] [place], [place] [address]    Report Status PENDING  Incomplete   Body fluid culture w Gram Stain     Status: None (Preliminary result)   Collection Time: 10/23/21  7:55 PM   Specimen: Body Fluid  Result Value Ref Range Status   Specimen Description FLUID  Final   Special Requests LEFT KNEE BURSA  Final   Gram Stain   Final    ABUNDANT WBC PRESENT, PREDOMINANTLY PMN MODERATE GRAM POSITIVE COCCI    Culture   Final    TOO YOUNG TO READ Performed at [facility], [address] [place], [place] [address]    Report Status PENDING  Incomplete  Resp Panel by RT-PCR (Flu A&B, Covid) Nasopharyngeal Swab     Status: None   Collection Time: 10/23/21 10:23 PM   Specimen: Nasopharyngeal Swab; Nasopharyngeal(NP) swabs in vial transport medium  Result Value Ref Range Status   SARS Coronavirus 2 by RT PCR NEGATIVE NEGATIVE Final    Comment: (NOTE) SARS-CoV-2 target nucleic acids are NOT DETECTED.  The SARS-CoV-2 RNA is generally detectable in upper respiratory specimens during the acute phase of infection. The lowest concentration of SARS-CoV-2 viral copies this assay can detect is 138 copies/mL. A negative result does not preclude SARS-Cov-2 infection and should not be used as the sole basis for treatment or other patient management decisions. A negative result may occur with  improper specimen collection/handling, submission of specimen other than nasopharyngeal swab, presence of viral mutation(s) within the areas targeted by this assay, and inadequate number of viral copies(<138 copies/mL). A negative result must be combined with clinical observations, patient history, and epidemiological information. The expected result is Negative.  Fact Sheet for Patients:  EntrepreneurPulse.com.au  Fact Sheet for Healthcare Providers:  IncredibleEmployment.be  This test is no t yet approved or cleared by the [place] FDA and  has been authorized for detection and/or diagnosis of SARS-CoV-2 by FDA under an  Emergency Use Authorization (EUA). This EUA will remain  in effect (meaning this test can be used) for the duration of the COVID-19 declaration under Section 564(b)(1) of the Act, 21 U.S.C.section 360bbb-3(b)(1), unless the authorization is terminated  or revoked sooner.       Influenza A by PCR NEGATIVE NEGATIVE Final   Influenza B by PCR NEGATIVE NEGATIVE Final  Comment: (NOTE) The Xpert Xpress SARS-CoV-2/FLU/RSV plus assay is intended as an aid in the diagnosis of influenza from Nasopharyngeal swab specimens and should not be used as a sole basis for treatment. Nasal washings and aspirates are unacceptable for Xpert Xpress SARS-CoV-2/FLU/RSV testing.  Fact Sheet for Patients: EntrepreneurPulse.com.au  Fact Sheet for Healthcare Providers: IncredibleEmployment.be  This test is not yet approved or cleared by the [place] FDA and has been authorized for detection and/or diagnosis of SARS-CoV-2 by FDA under an Emergency Use Authorization (EUA). This EUA will remain in effect (meaning this test can be used) for the duration of the COVID-19 declaration under Section 564(b)(1) of the Act, 21 U.S.C. section 360bbb-3(b)(1), unless the authorization is terminated or revoked.  Performed at [facility], [place] [address] [place], [place] [address]     Radiology Studies: CT KNEE LEFT W CONTRAST  Result Date: 10/23/2021 CLINICAL DATA:  Soft tissue infection redness and pain EXAM: CT OF THE left KNEE WITH CONTRAST TECHNIQUE: Multidetector CT imaging was performed following the standard protocol during bolus administration of intravenous contrast. CONTRAST:  53m OMNIPAQUE IOHEXOL 350 MG/ML SOLN COMPARISON:  Radiograph 10/23/2021 FINDINGS: Bones/Joint/Cartilage No fracture or malalignment. Mild medial joint space degenerative change. Trace knee effusion is present. Ligaments Suboptimally assessed by CT. Muscles and Tendons No intramuscular  fluid collections. Quadriceps and patellar tendons appear intact. Soft tissues Significant soft tissue swelling of the anterior knee. Rim enhancing fluid collection extends from the level of the lower pole patella, superficial to the patellar tendon, to the level of the tibial tuberosity. Fluid collection measures approximately 6.1 cm craniocaudad by 1 cm AP by 2.7 cm transverse. IMPRESSION: 1. Rim enhancing fluid collection measuring 6.1 x 1 x 2.7 cm within the soft tissues anterior to the knee, extending from the level of lower patella to the tibial tuberosity. Findings could be secondary to soft tissue abscess versus bursitis of inflammatory or infectious etiology. Aspiration could be considered for further evaluation. 2. No acute osseous abnormality. Electronically Signed   By: KDonavan FoilM.D.   On: 10/23/2021 18:56   DG Knee Complete 4 Views Left  Result Date: 10/23/2021 CLINICAL DATA:  Cellulitis EXAM: LEFT KNEE - COMPLETE 4+ VIEW COMPARISON:  Left knee x-ray 10/19/2021 FINDINGS: No acute fracture or dislocation identified. No bone destruction or periosteal reaction. Mild tricompartmental joint space narrowing. No joint effusion visualized. Anterior/prepatellar soft tissue swelling which has increased since previous study. Arterial vascular calcifications again noted. IMPRESSION: Significant anterior/prepatellar soft tissue swelling which has increased since previous study. No acute osseous abnormality identified. Electronically Signed   By: DOfilia NeasM.D.   On: 10/23/2021 12:56       Karlei Waldo T. [place] If 7PM-7AM, please contact night-coverage [email or web address] 10/24/2021, 9:33 AM

## 2021-10-24 NOTE — Anesthesia Postprocedure Evaluation (Signed)
Anesthesia Post Note  Patient: Jamarrion Budai  Procedure(s) Performed: IRRIGATION AND DEBRIDEMENT OF KNEE AND EXCISION PRE-PATELLA BURSA (Left)     Patient location during evaluation: PACU Anesthesia Type: General Level of consciousness: sedated Pain management: pain level controlled Vital Signs Assessment: post-procedure vital signs reviewed and stable Respiratory status: spontaneous breathing and respiratory function stable Cardiovascular status: stable Postop Assessment: no apparent nausea or vomiting Anesthetic complications: no   No notable events documented.  Last Vitals:  Vitals:   10/24/21 1349 10/24/21 1403  BP: (!) 165/91 (!) 168/87  Pulse: 71 75  Resp: 14   Temp: 36.7 C 36.8 C  SpO2: 96%     Last Pain:  Vitals:   10/24/21 1403  TempSrc: Oral  PainSc:                  Daviel Allegretto DANIEL

## 2021-10-24 NOTE — H&P (Signed)
PREOPERATIVE H&P  HPI: Patrick [name] is a 75 y.o. male who has presented today for surgery, with the diagnosis of L prepatellar septic bursitis.  The various methods of treatment have been discussed with the patient and family.  After consideration of risks, benefits, and other options for treatment, the patient has consented to IRRIGATION AND DEBRIDEMENT OF KNEE as a surgical intervention.  The patient's history has been reviewed, patient examined, no change in status, stable for surgery.  I have reviewed the patient's chart and labs.  Questions were answered to the patient's satisfaction.    PMH: Past Medical History:  Diagnosis Date   Diabetes mellitus without complication (HCC)     Home Medications Allergies  No current facility-administered medications on file prior to encounter.   Current Outpatient Medications on File Prior to Encounter  Medication Sig Dispense Refill   doxycycline (VIBRAMYCIN) 100 MG capsule Take 1 capsule (100 mg total) by mouth 2 (two) times daily. 20 capsule 0   ibuprofen (ADVIL) 200 MG tablet Take 200-400 mg by mouth every 6 (six) hours as needed for mild pain.     No Known Allergies   PSH: History reviewed. No pertinent surgical history.   Family History Social History  Family History  Family history unknown: Yes    Social History   Socioeconomic History   Marital status: Single    Spouse name: Not on file   Number of children: Not on file   Years of education: Not on file   Highest education level: Not on file  Occupational History   Not on file  Tobacco Use   Smoking status: Never   Smokeless tobacco: Not on file  Vaping Use   Vaping Use: Never used  Substance and Sexual Activity   Alcohol use: Yes   Drug use: No   Sexual activity: Not on file  Other Topics Concern   Not on file  Social History Narrative   Not on file   Social Determinants of Health   Financial Resource Strain: Not on file  Food Insecurity: Not on file   Transportation Needs: Not on file  Physical Activity: Not on file  Stress: Not on file  Social Connections: Not on file     Review of Systems: MSK: As noted per HPI above GI: No current Nausea/vomiting ENT: Denies sore throat, epistaxis CV: Denies chest pain Resp: No current shortness of breath  Other than mentioned above, there are no Constitutional, Neurological, Psychiatric, ENT, Ophthalmological, Cardiovascular, Respiratory, GI, GU, Musculoskeletal, Integumentary, Lymphatic, Endocrine or Allergic issues.   Physical Examination: CV: Normal distal pulses Lungs: Unlabored respirations LLE: Induration erythema TTP L prepatellar bursa.  +DF/PF/EHL.  SILT SP/DP/T.  [name].  Assessment/Plan: IRRIGATION AND DEBRIDEMENT OF KNEE    Ernestina Columbia M.D. Orthopaedic Surgery [facility]  Review of this patient's medications prescribed by other providers does not in any way constitute an endorsement by this clinician of their use, indications, dosage, route, efficacy, interactions, or other clinical parameters.  Portions of the record have been created with voice recognition software.  Grammatical and punctuation errors, random word insertions, wrong-word or "sound-a-like" substitutions, pronoun errors (inaccuracies and/or substitutions), and/or incomplete sentences may have occurred due to the inherent limitations of voice recognition software.  Not all errors are caught or corrected.  Although every attempt is made to root out erroneous and incomplete transcription, the note may still not fully represent the intent or opinion of the author.  Read the chart carefully and  recognize, using context, where errors/substitutions have occurred.  Any questions or concerns about the content of this note or information contained within the body of this dictation should be addressed directly with the author for clarification.

## 2021-10-24 NOTE — Progress Notes (Signed)
Patient transported by to 5N19, receiving RN at bedside, patient placed back on tele, VS stable, daughter notified of patients location, no questions or concerns from receiving RN or family.  Hermina Barters, RN

## 2021-10-24 NOTE — Op Note (Signed)
OPERATIVE NOTE  Patrick [name] male 75 y.o. 10/24/2021  PREOPERATIVE DIAGNOSIS: Left septic prepatellar bursitis Uncontrolled type 2 diabetes mellitus   POSTOPERATIVE DIAGNOSIS: Left septic prepatellar bursitis Uncontrolled type 2 diabetes mellitus  PROCEDURE(S): Excision left prepatellar bursa (76811) Application negative pressure wound therapy with DME (57262)  SURGEON: Ernestina Columbia, M.D.  ASSISTANT(S): None  ANESTHESIA: Choice  FINDINGS: Preoperative Examination: Induration and erythema of prepatellar bursa extending distally over patellar tendon and proximal tibia with associated palpable fluctuance.  No palpable effusion.  Intact dorsiflexion, plantarflexion, and EHL.  Sensation tact light touch in superficial peroneal, deep peroneal, tibial distributions.  Normal DP pulse.  Warm and well-perfused distally.  Operative Findings: Purulence contained within the prepatellar bursa, extending distally tracking superficially to the patellar tendon and over the proximal tibia.  Intact joint capsule.  Following debridement and bursal excision, healthy bleeding tissue.  IMPLANTS: * No implants in log *  INDICATIONS:  The patient is a 75 y.o. male with uncontrolled type 2 diabetes, A1c 12.  He previously presented to the ED with left knee cellulitis and prepatellar bursitis, and was discharged on doxycycline.  He came back in with increased fluctuance and concern for septic prepatellar bursitis with abscess formation.  Given failure of nonsurgical treatment, he was recommended to undergo surgery with excision of the bursa and debridement of the infection.  He understood the risks, benefits and alternatives to surgery which include but are not limited to bleeding, wound healing complications, infection, damage to surrounding structures, persistent pain, stiffness, lack of improvement, potential for subsequent arthritis or worsening of pre-existing arthritis, and need for further  surgery, as well as complications related to anesthesia, cardiovascular complications, and death.  He also understood the potential for continued pain in that there were no guarantees of acceptable outcome.  After weighing these risks the patient opted to proceed with surgery.  TECHNIQUE: Patient was identified in the preoperative holding area.  The left knee was marked by myself.  Consent was signed by myself and the patient.  Adductor canal block was performed by anesthesia in the preoperative holding area.  Patient was taken to the operative suite and placed supine on the operative table.  Anesthesia was induced by the anesthesia team.  The patient was positioned appropriately for the procedure and all bony prominences were well padded.  A non-sterile thigh tourniquet was placed on the operative extremity.  Preoperative antibiotics were given. The extremity was prepped and draped in the usual sterile fashion and surgical timeout was performed.  Limb was exsanguinated with gravity and the tourniquet was inflated to 250 mmHg.  A midline incision was marked out over the knee extending over the involved area.  Skin was incised sharply.  Underlying subcutaneous tissue was opened with Bovie electrocautery.  Purulence was immediately encountered within the deeper bursal area.  Purulence tracked down distally over the patellar tendon, and out medially and laterally over the proximal tibia and retinacular structures.  The joint capsule was patent and there was no apparent extension of the infection into the knee joint.  Proximally, the bursal tissue was sharply excised.  Throughout the involved area, all devitalized tissue was debrided sharply, as well as with curette and rondure.  Cultures were obtained.  Infected bursal tissue was sent for culture.  Following excision of the infected bursa, thorough debridement was carried out down to healthy bleeding tissue, to the level of the joint retinaculum (fascia), as well as  to the level of bone over the patella and proximal  tibia.  Removed tissue included necrotic tissue, devitalized tissue, nonviable tissue, and purulence.  Following debridement, the entire wound was copiously irrigated with 3 L of normal saline.  Hemostasis was obtained.  The incision was closed with #2 nylon vertical mattress sutures.  A negative pressure wound therapy device was then applied over the incision in order to promote continuing drainage and stimulate healing of the involved area.  This was connected to 125 mmHg continuous suction.  Patient was awakened from anesthesia and transferred to PACU in stable condition.  He tolerated the procedure well.  There were no complications.  POST OPERATIVE INSTRUCTIONS: Mobility: Out of bed with PT/OT Pain control: Continue to wean/titrate to appropriate oral regimen DVT Prophylaxis: Per primary team Further surgical plans: None RUE: No restrictions LUE: No restrictions RLE: No restrictions LLE: Weightbearing as tolerated Disposition: Per primary team Additional recommendations: Consult infectious disease for follow-up and management of antibiotics following discharge, as antibiotics will not be managed by orthopedic surgery Establish plan of care for diabetes management prior to discharge; with uncontrolled diabetes and A1c of 12 patient is at risk for ongoing wound complications and subsequent infections, as well as difficulty treating the present infection Dressing care:  Continue negative pressure therapy with wound VAC while patient is in hospital.  Transition to [facility] portable VAC at the time of discharge.   VAC change vs conversion to dry dressing at 1 week. Follow-up: Please call [facility] [phone]) to schedule follow-op appointment for 1 weeks after surgery.  TOURNIQUET TIME: 35 minutes  BLOOD LOSS: 20 mL         DRAINS: none         SPECIMEN: Left prepatellar bursa tissue for culture        COMPLICATIONS:  * No complications entered in OR log *         DISPOSITION: PACU - hemodynamically stable.         CONDITION: stable   Ernestina Columbia M.D. Orthopaedic Surgery [facility]   Portions of the record have been created with voice recognition software.  Grammatical and punctuation errors, random word insertions, wrong-word or "sound-a-like" substitutions, pronoun errors (inaccuracies and/or substitutions), and/or incomplete sentences may have occurred due to the inherent limitations of voice recognition software.  Not all errors are caught or corrected.  Although every attempt is made to root out erroneous and incomplete transcription, the note may still not fully represent the intent or opinion of the author.  Read the chart carefully and recognize, using context, where errors/substitutions have occurred.  Any questions or concerns about the content of this note or information contained within the body of this dictation should be addressed directly with the author for clarification.

## 2021-10-24 NOTE — Transfer of Care (Signed)
Immediate Anesthesia Transfer of Care Note  Patient: Patrick [name]  Procedure(s) Performed: IRRIGATION AND DEBRIDEMENT OF KNEE AND EXCISION PRE-PATELLA BURSA (Left)  Patient Location: PACU  Anesthesia Type:General  Level of Consciousness: awake, alert  and patient cooperative  Airway & Oxygen Therapy: Patient Spontanous Breathing and Patient connected to face mask oxygen  Post-op Assessment: Report given to RN and Post -op Vital signs reviewed and stable  Post vital signs: Reviewed and stable  Last Vitals:  Vitals Value Taken Time  BP 164/89 10/24/21 1321  Temp    Pulse 74 10/24/21 1322  Resp 13 10/24/21 1322  SpO2 97 % 10/24/21 1322  Vitals shown include unvalidated device data.  Last Pain:  Vitals:   10/24/21 1150  TempSrc:   PainSc: 0-No pain         Complications: No notable events documented.

## 2021-10-24 NOTE — H&P (Signed)
PREOPERATIVE H&P  HPI: Patrick [name] is a 75 y.o. male who has presented today for surgery, with the diagnosis of left prepatellar bursitis.  The various methods of treatment have been discussed with the patient and family.  After consideration of risks, benefits, and other options for treatment, the patient has consented to IRRIGATION AND DEBRIDEMENT OF KNEE as a surgical intervention.  The patient's history has been reviewed, patient examined, no change in status, stable for surgery.  I have reviewed the patient's chart and labs.  Questions were answered to the patient's satisfaction.    PMH: Past Medical History:  Diagnosis Date   Diabetes mellitus without complication (HCC)     Home Medications Allergies  No current facility-administered medications on file prior to encounter.   Current Outpatient Medications on File Prior to Encounter  Medication Sig Dispense Refill   doxycycline (VIBRAMYCIN) 100 MG capsule Take 1 capsule (100 mg total) by mouth 2 (two) times daily. 20 capsule 0   ibuprofen (ADVIL) 200 MG tablet Take 200-400 mg by mouth every 6 (six) hours as needed for mild pain.     No Known Allergies   PSH: History reviewed. No pertinent surgical history.   Family History Social History  Family History  Family history unknown: Yes    Social History   Socioeconomic History   Marital status: Single    Spouse name: Not on file   Number of children: Not on file   Years of education: Not on file   Highest education level: Not on file  Occupational History   Not on file  Tobacco Use   Smoking status: Never   Smokeless tobacco: Not on file  Vaping Use   Vaping Use: Never used  Substance and Sexual Activity   Alcohol use: Yes   Drug use: No   Sexual activity: Not on file  Other Topics Concern   Not on file  Social History Narrative   Not on file   Social Determinants of Health   Financial Resource Strain: Not on file  Food Insecurity: Not on file   Transportation Needs: Not on file  Physical Activity: Not on file  Stress: Not on file  Social Connections: Not on file     Review of Systems: MSK: As noted per HPI above GI: No current Nausea/vomiting ENT: Denies sore throat, epistaxis CV: Denies chest pain Resp: No current shortness of breath  Other than mentioned above, there are no Constitutional, Neurological, Psychiatric, ENT, Ophthalmological, Cardiovascular, Respiratory, GI, GU, Musculoskeletal, Integumentary, Lymphatic, Endocrine or Allergic issues.   Physical Examination: CV: Normal distal pulses Lungs: Unlabored respirations LLE: Induration and erythema prepatellar bursa, TTP.  +DF/PF/EHL.  SILT SP/DP/T.  [name].  Assessment/Plan: IRRIGATION AND DEBRIDEMENT OF KNEE    Ernestina Columbia M.D. Orthopaedic Surgery [facility]  Review of this patient's medications prescribed by other providers does not in any way constitute an endorsement by this clinician of their use, indications, dosage, route, efficacy, interactions, or other clinical parameters.  Portions of the record have been created with voice recognition software.  Grammatical and punctuation errors, random word insertions, wrong-word or "sound-a-like" substitutions, pronoun errors (inaccuracies and/or substitutions), and/or incomplete sentences may have occurred due to the inherent limitations of voice recognition software.  Not all errors are caught or corrected.  Although every attempt is made to root out erroneous and incomplete transcription, the note may still not fully represent the intent or opinion of the author.  Read the chart carefully and recognize,  using context, where errors/substitutions have occurred.  Any questions or concerns about the content of this note or information contained within the body of this dictation should be addressed directly with the author for clarification.

## 2021-10-24 NOTE — Plan of Care (Signed)
  Problem: Clinical Measurements: Goal: Respiratory complications will improve Outcome: Progressing   Problem: Clinical Measurements: Goal: Cardiovascular complication will be avoided Outcome: Progressing   Problem: Coping: Goal: Level of anxiety will decrease Outcome: Progressing   

## 2021-10-24 NOTE — Anesthesia Preprocedure Evaluation (Addendum)
Anesthesia Evaluation  Patient identified by MRN, date of birth, ID band Patient awake    Reviewed: Allergy & Precautions, NPO status , Patient's Chart, lab work & pertinent test results  Airway Mallampati: II  TM Distance: >3 FB Neck ROM: Full    Dental  (+) Dental Advisory Given, Edentulous Upper, Edentulous Lower   Pulmonary neg pulmonary ROS,    Pulmonary exam normal breath sounds clear to auscultation       Cardiovascular negative cardio ROS Normal cardiovascular exam Rhythm:Regular Rate:Normal     Neuro/Psych negative neurological ROS     GI/Hepatic negative GI ROS, Neg liver ROS,   Endo/Other  diabetes  Renal/GU negative Renal ROS     Musculoskeletal negative musculoskeletal ROS (+)   Abdominal   Peds  Hematology negative hematology ROS (+)   Anesthesia Other Findings   Reproductive/Obstetrics                           Anesthesia Physical Anesthesia Plan  ASA: 3  Anesthesia Plan: General   Post-op Pain Management: Tylenol PO (pre-op), Dilaudid IV and Regional block   Induction: Intravenous  PONV Risk Score and Plan: 2 and Ondansetron, Treatment may vary due to age or medical condition and Midazolam  Airway Management Planned: LMA  Additional Equipment: None  Intra-op Plan:   Post-operative Plan: Extubation in OR  Informed Consent: I have reviewed the patients History and Physical, chart, labs and discussed the procedure including the risks, benefits and alternatives for the proposed anesthesia with the patient or authorized representative who has indicated his/her understanding and acceptance.     Dental advisory given  Plan Discussed with: CRNA  Anesthesia Plan Comments:        Anesthesia Quick Evaluation

## 2021-10-25 ENCOUNTER — Encounter (HOSPITAL_COMMUNITY): Payer: Self-pay | Admitting: Orthopedic Surgery

## 2021-10-25 DIAGNOSIS — M009 Pyogenic arthritis, unspecified: Secondary | ICD-10-CM

## 2021-10-25 DIAGNOSIS — M7042 Prepatellar bursitis, left knee: Secondary | ICD-10-CM

## 2021-10-25 LAB — RENAL FUNCTION PANEL
Albumin: 2.5 g/dL — ABNORMAL LOW (ref 3.5–5.0)
Anion gap: 8 (ref 5–15)
BUN: 17 mg/dL (ref 8–23)
CO2: 23 mmol/L (ref 22–32)
Calcium: 8.6 mg/dL — ABNORMAL LOW (ref 8.9–10.3)
Chloride: 103 mmol/L (ref 98–111)
Creatinine, Ser: 0.59 mg/dL — ABNORMAL LOW (ref 0.61–1.24)
GFR, Estimated: >60 mL/min (ref >60)
Glucose, Bld: 211 mg/dL — ABNORMAL HIGH (ref 70–99)
Phosphorus: 3.2 mg/dL (ref 2.5–4.6)
Potassium: 3.5 mmol/L (ref 3.5–5.1)
Sodium: 134 mmol/L — ABNORMAL LOW (ref 135–145)

## 2021-10-25 LAB — CBC
HCT: 33.9 % — ABNORMAL LOW (ref 39.0–52.0)
Hemoglobin: 11.2 g/dL — ABNORMAL LOW (ref 13.0–17.0)
MCH: 28.4 pg (ref 26.0–34.0)
MCHC: 33 g/dL (ref 30.0–36.0)
MCV: 86 fL (ref 80.0–100.0)
Platelets: 194 10*3/uL (ref 150–400)
RBC: 3.94 MIL/uL — ABNORMAL LOW (ref 4.22–5.81)
RDW: 13.1 % (ref 11.5–15.5)
WBC: 10.3 10*3/uL (ref 4.0–10.5)
nRBC: 0 % (ref 0.0–0.2)

## 2021-10-25 LAB — LIPID PANEL
Cholesterol: 157 mg/dL (ref 0–200)
HDL: 31 mg/dL — ABNORMAL LOW (ref >40)
LDL Cholesterol: 111 mg/dL — ABNORMAL HIGH (ref 0–99)
Total CHOL/HDL Ratio: 5.1 RATIO
Triglycerides: 73 mg/dL (ref <150)
VLDL: 15 mg/dL (ref 0–40)

## 2021-10-25 LAB — MAGNESIUM: Magnesium: 2.3 mg/dL (ref 1.7–2.4)

## 2021-10-25 LAB — GLUCOSE, CAPILLARY
Glucose-Capillary: 243 mg/dL — ABNORMAL HIGH (ref 70–99)
Glucose-Capillary: 222 mg/dL — ABNORMAL HIGH (ref 70–99)
Glucose-Capillary: 230 mg/dL — ABNORMAL HIGH (ref 70–99)
Glucose-Capillary: 228 mg/dL — ABNORMAL HIGH (ref 70–99)
Glucose-Capillary: 196 mg/dL — ABNORMAL HIGH (ref 70–99)
Glucose-Capillary: 222 mg/dL — ABNORMAL HIGH (ref 70–99)

## 2021-10-25 MED ORDER — INSULIN STARTER KIT- PEN NEEDLES (ENGLISH)
1.0000 | Freq: Once | Status: AC
Start: 2021-10-25 — End: 2021-10-25
  Administered 2021-10-25: 18:00:00 1
  Filled 2021-10-25: qty 1

## 2021-10-25 MED ORDER — INSULIN GLARGINE-YFGN 100 UNIT/ML ~~LOC~~ SOLN
20.0000 [IU] | Freq: Every day | SUBCUTANEOUS | Status: DC
  Administered 2021-10-25: 12:00:00 20 [IU] via SUBCUTANEOUS
  Filled 2021-10-25 (×2): qty 0.2

## 2021-10-25 MED ORDER — ENOXAPARIN SODIUM 40 MG/0.4ML IJ SOSY
40.0000 mg | PREFILLED_SYRINGE | INTRAMUSCULAR | Status: DC
  Administered 2021-10-25 – 2021-10-26 (×2): 40 mg via SUBCUTANEOUS
  Filled 2021-10-25 (×2): qty 0.4

## 2021-10-25 NOTE — Discharge Instructions (Signed)
Discharge instructions for Dr. Ernestina Columbia, M.D., Orthopaedic Surgeon, [facility]:  Diet: Diebetes appropriate diet. Shower:  Okay to take a shower but the left lower extremity cannot get wet!!!  Water can damage/disrupt the [facility]! Dressing:  Continue negative pressure therapy with wound VAC while patient is in hospital.  Transition to [facility] portable VAC at the time of discharge.   Conversion to dry dressing at 1 week.  PLEASE INSTRUCT PATIENT/RESPONSIBLE PARTY IN USE OF PRAVEENA VAC MACHINE, INCLUDING REGULARLY PLUGGING IT IN TO CHARGE, PRIOR TO DISCHARGE!!! Activity:  Increase activity slowly as tolerated.  If you right leg is injured or immobilized, no driving for 6 weeks or until discussed with your surgeon.  If you have an injury or immobilization of the left lower extremity you may not operate a clutch. Please note that driving with any kind of immobilization for the upper extremity (sling, shoulder brace, splint, cast, etc.) may also be considered impaired driving and should not be attempted. Weight Bearing: As tolerated.  To prevent constipation: You may use over-the-counter stool softener(s) such as Colace (over the counter) 100 mg by mouth twice a day and/or Miralax (over the counter) for constipation as needed.  Drink plenty of fluids (prune juice may be helpful) and high fiber foods.  Itching:  If you experience itching with your medications, try taking only a single pain pill, or even half a pain pill at a time.  You can also use benadryl over the counter for itching or also to help with sleep.  Precautions:  If you experience chest pain or shortness of breath - call 911 immediately for transfer to the hospital emergency department!! Medications: Please contact the clinic during office hours (Monday through Friday, 0800 to 1600) if you need a refill on any medications.  Please monitor medications and allow 24 to 48 hours to process refill request!!!!   Please note that only medications directly related to the surgery can be prescribed.  For other medications (e.g. blood pressure medicines, sleeping medicines, etc.), please contact the prescribing physician or your primary care provider. DVT Prophylaxis: Per medical team.   If you develop a fever greater that 101.1 deg F, purulent drainage from wound, increased redness or drainage from wound, or calf pain -- Call the office at [phone].

## 2021-10-25 NOTE — Progress Notes (Addendum)
Orthopaedics Daily Progress Note   10/25/2021   8:07 AM  Patrick [name] is a 75 y.o. male 1 Day Post-Op s/p IRRIGATION AND DEBRIDEMENT OF KNEE AND EXCISION PRE-PATELLA BURSA  Subjective Pain appropriately controlled.  Denies nausea, vomiting, or fevers.   Objective Vitals:   10/24/21 2033 10/25/21 0319  BP: (!) 162/92 (!) 161/93  Pulse: 78 78  Resp: 18 19  Temp: 98.9 F (37.2 C) (!) 100.4 F (38 C)  SpO2: 95% 96%    Intake/Output Summary (Last 24 hours) at 10/25/2021 5625 Last data filed at 10/25/2021 0319 Gross per 24 hour  Intake 1020 ml  Output 920 ml  Net 100 ml    Physical Exam LLE: Dressing clean, dry, and intact and VAC intact and holding suction +DF/PF/EHL SILT SP/DP/T +DP/PT and WWP distally  Assessment 75 y.o. male s/p Procedure(s) (LRB): IRRIGATION AND DEBRIDEMENT OF KNEE AND EXCISION PRE-PATELLA BURSA (Left)  The patient's uncontrolled diabetes is the underlying etiology for this presentation and condition.   Plan Mobility: Out of bed with PT/OT Pain control: Continue to wean/titrate to appropriate oral regimen DVT Prophylaxis: Per primary team Further surgical plans: None RUE: No restrictions LUE: No restrictions RLE: No restrictions LLE: Weightbearing as tolerated Disposition: Per primary team Dressing care:  Continue negative pressure therapy with wound VAC while patient is in hospital.  Transition to [facility] portable VAC at the time of discharge.   Conversion to dry dressing at 1 week.  PLEASE INSTRUCT PATIENT/RESPONSIBLE PARTY IN USE OF PRAVEENA VAC MACHINE, INCLUDING REGULARLY PLUGGING IT IN TO CHARGE, PRIOR TO DISCHARGE!!! Follow-up: Please call [facility] [phone]) to schedule follow-op appointment for 1 weeks after surgery. Additional recommendations: Consult Infectious Disease for follow-up and management of antibiotics following discharge, as antibiotics will not be managed by orthopaedic  surgery. Establish plan of care for diabetes post-discharge management prior to discharge; with uncontrolled diabetes (A1c of 12) patient is at risk for delayed healing and ongoing wound complications as well as subsequent infections, and difficulty treating the present infection.  The patient's uncontrolled diabetes is the underlying etiology for this presentation and condition.  Healing and resolution of the patient's current condition depends on appropriate ongoing glucose control and diabetes management.  Diabetes will not be managed by orthopaedic surgery.  I have verified that my discharge instructions and follow-up information has been entered in the Discharge Navigator in Epic.  These should automatically populate in the AVS.  Please print the AVS in its entirety and ensure that the patient or a responsible party has a complete copy of the AVS before they are discharged.  If there are questions regarding discharge instructions or follow-up before the AVS is generated, please check the Discharge Navigator before attempting to contact the surgeon/office.  If unsure how to access the Discharge Navigator or the information contained in the Discharge Navigator, or how to generate/print the AVS, please contact the appropriate Proofreader.   Ernestina Columbia M.D. Orthopaedic Surgery [facility]

## 2021-10-25 NOTE — Progress Notes (Signed)
Mobility Specialist Criteria Algorithm Info.   10/25/21 1515  Mobility  Activity Ambulated in hall;Dangled on edge of bed  Range of Motion/Exercises Active;All extremities  Level of Assistance Standby assist, set-up cues, supervision of patient - no hands on  Assistive Device Front wheel walker  Distance Ambulated (ft) 120 ft  Mobility Ambulated with assistance in hallway  Mobility Response Tolerated well  Mobility performed by Mobility specialist  Bed Position Chair   Patient ambulated in hallway supervision level with steady gait. Tolerated ambulation well without complaint or incident. Was left in recliner chair with all needs met.   10/25/2021 5:06 PM

## 2021-10-25 NOTE — OR Nursing (Signed)
Had to document tourniquet deflation time

## 2021-10-25 NOTE — Evaluation (Signed)
Physical Therapy Evaluation Patient Details Name: Patrick [name] MRN: [id number] DOB: 24-Feb-1946 Today's Date: 10/25/2021  History of Present Illness  Patrick [name] is a 75 y.o. male admitted 12/27  with 1 month history of painful left knee. This quickly worsened 1 week ago with rapidly spreading erythema, swelling, pain and intolerance to walking despite oral antibiotics and required hospital admission for surgical management. He is now POD 1 following I&D with bursectomy. VAC in place. PMH: DM, HTN  Clinical Impression  Pt admitted with above diagnosis. Pt was able to ambulate with RW with min guard assist with good safety awareness.  Pt will need RW for home and daughter is aware.  Should not need f/u.  Pt currently with functional limitations due to the deficits listed below (see PT Problem List). Pt will benefit from skilled PT to increase their independence and safety with mobility to allow discharge to the venue listed below.          Recommendations for follow up therapy are one component of a multi-disciplinary discharge planning process, led by the attending physician.  Recommendations may be updated based on patient status, additional functional criteria and insurance authorization.  Follow Up Recommendations No PT follow up    Assistance Recommended at Discharge PRN  Functional Status Assessment Patient has had a recent decline in their functional status and demonstrates the ability to make significant improvements in function in a reasonable and predictable amount of time.  Equipment Recommendations  Rolling walker (2 wheels)    Recommendations for Other Services       Precautions / Restrictions Precautions:  VAC Precautions: Fall Restrictions Weight Bearing Restrictions: No      Mobility  Bed Mobility Overal bed mobility: Independent                  Transfers Overall transfer level: Needs assistance Equipment used: Rolling walker (2  wheels) Transfers: Sit to/from Stand Sit to Stand: Supervision                Ambulation/Gait Ambulation/Gait assistance: Min guard Gait Distance (Feet): 150 Feet Assistive device: Rolling walker (2 wheels) Gait Pattern/deviations: Step-to pattern;Decreased stance time - left;Decreased weight shift to left;Antalgic   Gait velocity interpretation: <1.31 ft/sec, indicative of household ambulator   General Gait Details: Pt was able to ambulate with RW with steady gait overall. Daughter present and is aware of how to guard pt with gait belt and issued gait belt.  Stairs            Wheelchair Mobility    Modified Rankin (Stroke Patients Only)       Balance Overall balance assessment: Needs assistance Sitting-balance support: No upper extremity supported;Feet supported Sitting balance-Leahy Scale: Fair     Standing balance support: Bilateral upper extremity supported;During functional activity Standing balance-Leahy Scale: Poor Standing balance comment: Pt was able to stand with RW wtih min guard assist to supervision.                             Pertinent Vitals/Pain Pain Assessment: Faces Faces Pain Scale: Hurts even more Pain Location: left knee Pain Descriptors / Indicators: Aching;Grimacing;Guarding Pain Intervention(s): Limited activity within patient's tolerance;Monitored during session;Repositioned;Patient requesting pain meds-RN notified;RN gave pain meds during session    Home Living Family/patient expects to be discharged to:: Private residence Living Arrangements: Children;Spouse/significant other (wife and daughters) Available Help at Discharge: Family;Available 24 hours/day Type of Home: House Home Access: Level  entry       Home Layout: One level Home Equipment: None;Grab bars - toilet Additional Comments: didnt drive, retired    Prior Therapist, sports Comments: No issues with mobiltiy PTA       Hand  Dominance   Dominant Hand: Right    Extremity/Trunk Assessment   Upper Extremity Assessment Upper Extremity Assessment: Defer to OT evaluation    Lower Extremity Assessment Lower Extremity Assessment: LLE deficits/detail LLE: Unable to fully assess due to pain       Communication      Cognition Arousal/Alertness: Awake/alert Behavior During Therapy: WFL for tasks assessed/performed Overall Cognitive Status: Within Functional Limits for tasks assessed                                          General Comments      Exercises General Exercises - Lower Extremity Ankle Circles/Pumps: AROM;Both;10 reps;Seated Long Arc Quad: AROM;Both;10 reps;Seated   Assessment/Plan    PT Assessment Patient needs continued PT services  PT Problem List Decreased activity tolerance;Decreased balance;Decreased mobility;Decreased knowledge of use of DME;Decreased safety awareness;Decreased knowledge of precautions;Pain       PT Treatment Interventions DME instruction;Gait training;Functional mobility training;Therapeutic activities;Therapeutic exercise;Balance training;Patient/family education    PT Goals (Current goals can be found in the Care Plan section)  Acute Rehab PT Goals Patient Stated Goal: to go home PT Goal Formulation: With patient Time For Goal Achievement: 11/08/21 Potential to Achieve Goals: Good    Frequency Min 5X/week   Barriers to discharge        Co-evaluation               AM-PAC PT "6 Clicks" Mobility  Outcome Measure Help needed turning from your back to your side while in a flat bed without using bedrails?: None Help needed moving from lying on your back to sitting on the side of a flat bed without using bedrails?: A Little Help needed moving to and from a bed to a chair (including a wheelchair)?: A Little Help needed standing up from a chair using your arms (e.g., wheelchair or bedside chair)?: A Little Help needed to walk in hospital  room?: A Little Help needed climbing 3-5 steps with a railing? : A Little 6 Click Score: 19    End of Session Equipment Utilized During Treatment: Gait belt Activity Tolerance: Patient limited by fatigue Patient left: in chair;with call bell/phone within reach;with chair alarm set;with family/visitor present Nurse Communication: Mobility status PT Visit Diagnosis: Muscle weakness (generalized) (M62.81);Pain Pain - Right/Left: Left Pain - part of body: Leg    Time: 1210-1233 PT Time Calculation (min) (ACUTE ONLY): 23 min   Charges:   PT Evaluation $PT Eval Moderate Complexity: 1 Mod PT Treatments $Gait Training: 8-22 mins        Laylaa Guevarra M,PT Acute Rehab Services [phone] (pager)   Bevelyn Buckles 10/25/2021, 1:52 PM

## 2021-10-25 NOTE — Progress Notes (Signed)
Inpatient Diabetes Program Recommendations  AACE/ADA: New Consensus Statement on Inpatient Glycemic Control (2015)  Target Ranges:  Prepandial:   less than 140 mg/dL      Peak postprandial:   less than 180 mg/dL (1-2 hours)      Critically ill patients:  140 - 180 mg/dL   Lab Results  Component Value Date   GLUCAP 228 (H) 10/25/2021   HGBA1C 12.5 (H) 10/24/2021    Review of Glycemic Control  Latest Reference Range & Units 10/25/21 01:21 10/25/21 03:18 10/25/21 09:41 10/25/21 11:42  Glucose-Capillary 70 - 99 mg/dL 641 (H) 583 (H) 094 (H) 228 (H)  (H): Data is abnormally high  Diabetes history: DM2 Outpatient Diabetes medications: none Current orders for Inpatient glycemic control: Semglee 20 units added today, Novolog 0-9 units Q4H  Inpatient Diabetes Program Recommendations:    Novolog 0-9 units TID and HS  Attempted to teach insulin to daughter.  She is very overwhelmed and would like to wait until her sister is with her tomorrow.  She states they will be giving the insulin as he will be be able to administer himself.  She states he does not remember things and would not be appropriate to administer insulin to self.    Will need affordable insulin at discharge 70/30.  Will follow trends and make recs for 70/30 prior to DC.  DM team will follow up with family tomorrow.    Will continue to follow while inpatient.  Thank you, Dulce Sellar, RN, BSN Diabetes Coordinator Inpatient Diabetes Program [phone] (team pager from 8a-5p)

## 2021-10-25 NOTE — Progress Notes (Signed)
PROGRESS NOTE  Leonardo Makris SMO:[id number] DOB: 25-Oct-1946   PCP: Pcp, No  Patient is from: Home  DOA: 10/23/2021 LOS: 1  Chief complaints:  Chief Complaint  Patient presents with   Knee Pain     Brief Narrative / Interim history: 75 year old M with PMH of DM-2 and HTN not on any medication presented to ED with left knee swelling and pain for about 1 week that has not improved with oral antibiotics, and admitted with prepatellar bursitis/abscess -  CRP 15.1.  ESR 115.  CT showed rim-enhancing fluid collection measuring 6.1 x 1 x 2.7 cm within the soft tissue anterior to the knee concerning for abscess versus bursitis.   -Underwent needle aspiration 12/27, fluid stated with intracellular CPP but unable to perform cell count due to clot.  Culture pending.   Started on cefepime vancomycin.  Orthopedic surgery consulted -Underwent excision of bursa and wound VAC 12/28  Subjective: -Feels better today  Objective: Vitals:   10/24/21 1749 10/24/21 2033 10/25/21 0319 10/25/21 0827  BP: (!) 166/88 (!) 162/92 (!) 161/93 (!) 171/89  Pulse: 79 78 78 77  Resp: 15 18 19 15   Temp: 99 F (37.2 C) 98.9 F (37.2 C) (!) 100.4 F (38 C) 99.2 F (37.3 C)  TempSrc: Oral Oral Oral Oral  SpO2: 96% 95% 96% 93%  Weight:      Height:        Examination:  Gen: Awake, Alert, Oriented X 3,  HEENT: no JVD Lungs: Good air movement bilaterally, CTAB CVS: S1S2/RRR Abd: soft, Non tender, non distended, BS present Extremities: Left knee with dressing, wound VAC noted, Ace wrap  SKIN: As above NEURO: Awake, alert and oriented appropriately.  No apparent focal neuro deficit. PSYCH: Calm. Normal affect.    Microbiology summarized: COVID-19 and influenza PCR nonreactive. Blood cultures NGTD. Aspiration culture : Gram stain with gram-positive cocci  Assessment & Plan:  Left septic prepatellar bursitis/abscess  - Markedly elevated inflammatory markers.  Fluid study with CPP. -Underwent  needle aspiration in the ED -Gram stain with moderate gram-positive cocci from 12/27 and 12/28 -Remains on IV vancomycin and cefepime -Orthopedics following, underwent excision of left prepatellar bursa with application of wound VAC 12/28 Dr. Mable Fill, cultures as above with moderate gram-positive cocci -Will request ID input for duration of antibiotics -PT OT eval and  Uncontrolled DM-2 with hyperglycemia: Not on medication at home.  Wasn't able to see PCP due to financial issues. -Hemoglobin A1c was 12.5 -CBGs poorly controlled, add basal insulin -Diabetic coordinator and TOC  Uncontrolled hypertension: Not on medication at home. -Increase lisinopril to 20 mg daily -Continue hydralazine as needed  Social issues-patient's daughter reports difficulty affording medication or seeing a doctor. -TOC consulted for PCP and medication assistance  Body mass index is 26.72 kg/m.         DVT prophylaxis: Start Lovenox SCDs Start: 10/23/21 2105  Code Status: Full code Family Communication: Updated patient's son at bedside. Level of care: Telemetry Medical Status is: inpatient    Procedures,  Procedures:  12/27-needle aspiration of anterior left knee fluid collection excision of left prepatellar bursa, application of wound VAC, Dr. Mable Fill 12/28  Consultants:  Orthopedic surgery   Sch Meds:  Scheduled Meds:  atorvastatin  40 mg Oral Daily   insulin aspart  0-9 Units Subcutaneous Q4H   lisinopril  20 mg Oral Daily   mupirocin ointment  1 application Nasal BID   Continuous Infusions:  ceFEPime (MAXIPIME) IV 2 g (10/25/21 0515)  vancomycin 1,000 mg (10/24/21 2258)   PRN Meds:.acetaminophen **OR** acetaminophen, hydrALAZINE  Antimicrobials: Anti-infectives (From admission, onward)    Start     Dose/Rate Route Frequency Ordered Stop   10/25/21 1100  vancomycin (VANCOCIN) IVPB 1000 mg/200 mL premix  Status:  Discontinued        1,000 mg 200 mL/hr over 60 Minutes Intravenous  Every 36 hours 10/23/21 2259 10/24/21 0757   10/24/21 2200  vancomycin (VANCOCIN) IVPB 1000 mg/200 mL premix        1,000 mg 200 mL/hr over 60 Minutes Intravenous Every 24 hours 10/24/21 0757     10/24/21 1130  ceFAZolin (ANCEF) IVPB 2g/100 mL premix        2 g 200 mL/hr over 30 Minutes Intravenous On call to O.R. 10/24/21 1032 10/24/21 1258   10/24/21 1039  ceFAZolin (ANCEF) 2-4 GM/100ML-% IVPB       Note to Pharmacy: Odelia Gage E: cabinet override      10/24/21 1039 10/24/21 1237   10/24/21 0500  ceFEPIme (MAXIPIME) 2 g in sodium chloride 0.9 % 100 mL IVPB  Status:  Discontinued        2 g 200 mL/hr over 30 Minutes Intravenous Every 8 hours 10/23/21 2112 10/23/21 2302   10/24/21 0500  ceFEPIme (MAXIPIME) 2 g in sodium chloride 0.9 % 100 mL IVPB        2 g 200 mL/hr over 30 Minutes Intravenous Every 12 hours 10/23/21 2302     10/23/21 2115  ceFEPIme (MAXIPIME) 2 g in sodium chloride 0.9 % 100 mL IVPB  Status:  Discontinued        2 g 200 mL/hr over 30 Minutes Intravenous  Once 10/23/21 2106 10/23/21 2112   10/23/21 2100  vancomycin (VANCOREADY) IVPB 1500 mg/300 mL        1,500 mg 150 mL/hr over 120 Minutes Intravenous  Once 10/23/21 2052 10/24/21 0138   10/23/21 2100  piperacillin-tazobactam (ZOSYN) IVPB 3.375 g        3.375 g 100 mL/hr over 30 Minutes Intravenous  Once 10/23/21 2052 10/23/21 2149        I have personally reviewed the following labs and images: CBC: Recent Labs  Lab 10/19/21 1209 10/23/21 1231 10/24/21 0513 10/25/21 0345  WBC 8.8 8.6 7.9 10.3  NEUTROABS 6.3 6.8  --   --   HGB 13.6 13.0 11.3* 11.2*  HCT 41.4 40.8 36.4* 33.9*  MCV 85.4 87.2 87.9 86.0  PLT 215 245 204 194   BMP &GFR Recent Labs  Lab 10/19/21 1209 10/23/21 1231 10/24/21 0513 10/25/21 0345  NA 131* 136 138 134*  K 4.0 3.9 3.5 3.5  CL 100 102 106 103  CO2 21* 23 24 23   GLUCOSE 381* 383* 227* 211*  BUN 16 20 15 17   CREATININE 0.71 0.70 0.48* 0.59*  CALCIUM 9.3 9.6 8.7* 8.6*   MG  --   --   --  2.3  PHOS  --   --   --  3.2   Estimated Creatinine Clearance: 61.2 mL/min (A) (by C-G formula based on SCr of 0.59 mg/dL (L)). Liver & Pancreas: Recent Labs  Lab 10/25/21 0345  ALBUMIN 2.5*   No results for input(s): LIPASE, AMYLASE in the last 168 hours. No results for input(s): AMMONIA in the last 168 hours. Diabetic: Recent Labs    10/24/21 0513  HGBA1C 12.5*   Recent Labs  Lab 10/24/21 2037 10/24/21 2301 10/25/21 0121 10/25/21 0318 10/25/21 0941  GLUCAP 242* 266*  243* 222* 230*   Cardiac Enzymes: No results for input(s): CKTOTAL, CKMB, CKMBINDEX, TROPONINI in the last 168 hours. No results for input(s): PROBNP in the last 8760 hours. Coagulation Profile: Recent Labs  Lab 10/19/21 1209  INR 1.1   Thyroid Function Tests: No results for input(s): TSH, T4TOTAL, FREET4, T3FREE, THYROIDAB in the last 72 hours. Lipid Profile: Recent Labs    10/25/21 0345  CHOL 157  HDL 31*  LDLCALC 111*  TRIG 73  CHOLHDL 5.1   Anemia Panel: No results for input(s): VITAMINB12, FOLATE, FERRITIN, TIBC, IRON, RETICCTPCT in the last 72 hours. Urine analysis: No results found for: COLORURINE, APPEARANCEUR, LABSPEC, PHURINE, GLUCOSEU, HGBUR, BILIRUBINUR, KETONESUR, PROTEINUR, UROBILINOGEN, NITRITE, LEUKOCYTESUR Sepsis Labs: Invalid input(s): PROCALCITONIN, [place]  Microbiology: Recent Results (from the past 240 hour(s))  Culture, blood (routine x 2)     Status: None (Preliminary result)   Collection Time: 10/23/21  2:32 AM   Specimen: BLOOD  Result Value Ref Range Status   Specimen Description BLOOD SITE NOT SPECIFIED  Final   Special Requests   Final    BOTTLES DRAWN AEROBIC AND ANAEROBIC Blood Culture adequate volume   Culture   Final    NO GROWTH 1 DAY Performed at [facility], [address], [place], [place] [address]    Report Status PENDING  Incomplete  Culture, blood (routine x 2)     Status: None (Preliminary result)   Collection  Time: 10/23/21 12:35 PM   Specimen: BLOOD RIGHT ARM  Result Value Ref Range Status   Specimen Description BLOOD RIGHT ARM  Final   Special Requests   Final    BOTTLES DRAWN AEROBIC AND ANAEROBIC Blood Culture results may not be optimal due to an inadequate volume of blood received in culture bottles   Culture   Final    NO GROWTH 2 DAYS Performed at [facility], [place] [address] [place], [place] [address]    Report Status PENDING  Incomplete  Body fluid culture w Gram Stain     Status: None (Preliminary result)   Collection Time: 10/23/21  7:55 PM   Specimen: Body Fluid  Result Value Ref Range Status   Specimen Description FLUID  Final   Special Requests LEFT KNEE BURSA  Final   Gram Stain   Final    ABUNDANT WBC PRESENT, PREDOMINANTLY PMN MODERATE GRAM POSITIVE COCCI    Culture   Final    CULTURE REINCUBATED FOR BETTER GROWTH Performed at [facility], [address] [place], [place] [address]    Report Status PENDING  Incomplete  Resp Panel by RT-PCR (Flu A&B, Covid) Nasopharyngeal Swab     Status: None   Collection Time: 10/23/21 10:23 PM   Specimen: Nasopharyngeal Swab; Nasopharyngeal(NP) swabs in vial transport medium  Result Value Ref Range Status   SARS Coronavirus 2 by RT PCR NEGATIVE NEGATIVE Final    Comment: (NOTE) SARS-CoV-2 target nucleic acids are NOT DETECTED.  The SARS-CoV-2 RNA is generally detectable in upper respiratory specimens during the acute phase of infection. The lowest concentration of SARS-CoV-2 viral copies this assay can detect is 138 copies/mL. A negative result does not preclude SARS-Cov-2 infection and should not be used as the sole basis for treatment or other patient management decisions. A negative result may occur with  improper specimen collection/handling, submission of specimen other than nasopharyngeal swab, presence of viral mutation(s) within the areas targeted by this assay, and inadequate number of viral copies(<138  copies/mL).  A negative result must be combined with clinical observations, patient history, and epidemiological information. The expected result is Negative.  Fact Sheet for Patients:  EntrepreneurPulse.com.au  Fact Sheet for Healthcare Providers:  IncredibleEmployment.be  This test is no t yet approved or cleared by the [place] FDA and  has been authorized for detection and/or diagnosis of SARS-CoV-2 by FDA under an Emergency Use Authorization (EUA). This EUA will remain  in effect (meaning this test can be used) for the duration of the COVID-19 declaration under Section 564(b)(1) of the Act, 21 U.S.C.section 360bbb-3(b)(1), unless the authorization is terminated  or revoked sooner.       Influenza A by PCR NEGATIVE NEGATIVE Final   Influenza B by PCR NEGATIVE NEGATIVE Final    Comment: (NOTE) The Xpert Xpress SARS-CoV-2/FLU/RSV plus assay is intended as an aid in the diagnosis of influenza from Nasopharyngeal swab specimens and should not be used as a sole basis for treatment. Nasal washings and aspirates are unacceptable for Xpert Xpress SARS-CoV-2/FLU/RSV testing.  Fact Sheet for Patients: EntrepreneurPulse.com.au  Fact Sheet for Healthcare Providers: IncredibleEmployment.be  This test is not yet approved or cleared by the [place] FDA and has been authorized for detection and/or diagnosis of SARS-CoV-2 by FDA under an Emergency Use Authorization (EUA). This EUA will remain in effect (meaning this test can be used) for the duration of the COVID-19 declaration under Section 564(b)(1) of the Act, 21 U.S.C. section 360bbb-3(b)(1), unless the authorization is terminated or revoked.  Performed at [facility], [place] [address] [place], [place] [address]   Surgical PCR screen     Status: None   Collection Time: 10/24/21  9:01 AM   Specimen: Nasal Mucosa; Nasal Swab  Result Value Ref  Range Status   MRSA, PCR NEGATIVE NEGATIVE Final   Staphylococcus aureus NEGATIVE NEGATIVE Final    Comment: (NOTE) The Xpert SA Assay (FDA approved for NASAL specimens in patients 83 years of age and older), is one component of a comprehensive surveillance program. It is not intended to diagnose infection nor to guide or monitor treatment. Performed at [facility], [place] [address] [place], [place] [address]   Aerobic/Anaerobic Culture w Gram Stain (surgical/deep wound)     Status: None (Preliminary result)   Collection Time: 10/24/21 12:50 PM   Specimen: PATH Soft tissue  Result Value Ref Range Status   Specimen Description TISSUE  Final   Special Requests BURSA LEFT KNEE SPEC B  Final   Gram Stain   Final    MODERATE WBC PRESENT,BOTH PMN AND MONONUCLEAR FEW GRAM POSITIVE COCCI Performed at [facility], [place] [address], [place], [place] [address]    Culture PENDING  Incomplete   Report Status PENDING  Incomplete    Radiology Studies: No results found.    Domenic Polite, MD [facility]  If 7PM-7AM, please contact night-coverage [email or web address] 10/25/2021, 10:20 AM

## 2021-10-25 NOTE — TOC Progression Note (Addendum)
Transition of Care [facility]) - Progression Note    Patient Details  Name: Patrick [name] MRN: [id number] Date of Birth: [birth year]  [facility] CM/SW Contact  Coren Crownover, Adria Devon, RN Phone Number: 10/25/2021, 11:35 AM  Clinical Narrative:      Scheduled hospital follow up and to establish care with Dr Gwinda Passe November 20, 2021 at 1:30 pm.   Patient can use pharmacy at [facility].   NCM changed pharmacy to [facility] and will assist with discharge prescriptions through [facility]     Placed Financial Counselor consult    Expected Discharge Plan and Services                                                 Social Determinants of Health (SDOH) Interventions    Readmission Risk Interventions No flowsheet data found.

## 2021-10-25 NOTE — Consult Note (Signed)
Patrick [name]    Date of Admission:  10/23/2021     Total days of antibiotics 1   Vancomycin12/27 >> current  Cefepime 12/27 >> current              Reason for Consult: Septic bursitis    Referring Provider: Broadus [name] Primary Care Provider: Pcp, Patrick [name]   Assessment: Patrick [name] is a 75 y.o. male very pleasant Spanish speaking patient with 1 month history of painful left knee. This quickly worsened 1 week ago with rapidly spreading erythema, swelling, pain and intolerance to walking despite oral antibiotics and required hospital admission for surgical management. He is now POD 1 following I&D with bursectomy. Op note carefully reviewed shows Patrick [name] concern over bone or joint capsule being infected.  Clinically he feels better. One low grade temp overnight likely attributed to surgery/anesthesia. Suspect this will continue to improve given source of infection has been tended to.   He can tolerate POs, is not systemically ill and seems to have had excellent surgical control with Patrick [name] bone/joint involvement.  He would be a good candidate for oral therapy. Will stop cefepime for now and follow cultures for staphylococcus aureus susceptibilities. Given uncontrolled DM / immunocompromised would plan to treat him with 3 weeks antibiotics barring clinical response.   Will adjust antibiotics further tomorrow - they were curious when he could go home. I would like to have the final antibiotic plan arranged for them so we can provide via [facility] program prior to D/C. Suspect Friday or Saturday - would however ultimately defer to Hospitalist and Ortho teams given other medical problems.    Plan: Stop cefepime Continue vancomycin  Follow susceptibility report Planning oral treatment for 3 weeks       Principal Problem:   Cellulitis of knee Active Problems:   Uncontrolled type 2 diabetes mellitus with hyperglycemia (HCC)   Infection of left knee (HCC)     atorvastatin  40 mg Oral Daily   enoxaparin (LOVENOX) injection  40 mg Subcutaneous Q24H   insulin aspart  0-9 Units Subcutaneous Q4H   insulin glargine-yfgn  20 Units Subcutaneous Daily   insulin starter kit- pen needles  1 kit Other Once   lisinopril  20 mg Oral Daily   mupirocin ointment  1 application Nasal BID    HPI: Patrick [name] is a 75 y.o. male admitted with worsening pain and infection of the left knee.   His daughter is at the bedside that helps with the history. Stratus tablet used for interpretation.   They state that the pain in the knee started 1 month ago but has been worsening over the last 1 week to where he was not able to walk on it. He went to ER on 12/23 for care and was given rx for doxycycline to treat cellulitis of the knee. After 3 days of trying the antibiotic they realized it was getting worse and then came back to ER on 12/27.   Throughout this time he has not had any fevers/chills. He does not work and states "he is very lazy at home and does nothing usually." He has been off diabetes medications for 6 months due to financial limitations to afford meds. Eats unrestricted diet. While they told us that he has had Patrick [name] injury to the knee there was documentation in the ER note that he may have fallen on it a week prior. CT scan in ER showed infectious bursitis vs abscess.  He was non-septic appearing and had severe uncontrolled hyperglycemia. Empirically started on antibiotics and knee was aspirated. He was taken to OR on 12/28 for clean out with Patrick [name].   Operative Note 12/28 - purulence immediately encounter into the deeper bursal area that tracked down the patellar tendon and out medially/laterally over the proximal tibia and retinacular structures. Joint capsule was patent and Patrick [name] apparent extension of the infection into the knee joint capsule did not show any evidence of infection. Bursa was excised and all devitalized tissue irrigated.   So far aspirate  growing abundant staphylococcus aureus with susceptibilities pending. He states he still has pain on the top of the knee but was able to walk to the bathroom today. His daughter mentioned he had one fever last night.    Review of Systems: ROS  Past Medical History:  Diagnosis Date   Diabetes mellitus without complication ([place])     Social History   Tobacco Use   Smoking status: Never  Vaping Use   Vaping Use: Never used  Substance Use Topics   Alcohol use: Yes   Drug use: Patrick [name]    Family History  Family history unknown: Yes   Patrick [name] Known Allergies  OBJECTIVE: Blood pressure (!) 174/99, pulse 75, temperature 98.5 F (36.9 C), temperature source Oral, resp. rate 16, height 4' 11"  (1.499 m), weight 60 kg, SpO2 98 %.  Physical Exam  Lab Results Lab Results  Component Value Date   WBC 10.3 10/25/2021   HGB 11.2 (L) 10/25/2021   HCT 33.9 (L) 10/25/2021   MCV 86.0 10/25/2021   PLT 194 10/25/2021    Lab Results  Component Value Date   CREATININE 0.59 (L) 10/25/2021   BUN 17 10/25/2021   NA 134 (L) 10/25/2021   K 3.5 10/25/2021   CL 103 10/25/2021   CO2 23 10/25/2021    Lab Results  Component Value Date   ALT 36 03/22/2014   AST 54 (H) 03/22/2014   ALKPHOS 170 (H) 03/22/2014   BILITOT 0.4 03/22/2014     Microbiology: Recent Results (from the past 240 hour(s))  Culture, blood (routine x 2)     Status: None (Preliminary result)   Collection Time: 10/23/21  2:32 AM   Specimen: BLOOD  Result Value Ref Range Status   Specimen Description BLOOD SITE NOT SPECIFIED  Final   Special Requests   Final    BOTTLES DRAWN AEROBIC AND ANAEROBIC Blood Culture adequate volume   Culture   Final    Patrick [name] GROWTH 1 DAY Performed at [facility], [place] [address] [place], [place] [address]    Report Status PENDING  Incomplete  Culture, blood (routine x 2)     Status: None (Preliminary result)   Collection Time: 10/23/21 12:35 PM   Specimen: BLOOD RIGHT ARM  Result Value Ref  Range Status   Specimen Description BLOOD RIGHT ARM  Final   Special Requests   Final    BOTTLES DRAWN AEROBIC AND ANAEROBIC Blood Culture results may not be optimal due to an inadequate volume of blood received in culture bottles   Culture   Final    Patrick [name] GROWTH 2 DAYS Performed at [facility], [place] [address], [place],  [address]    Report Status PENDING  Incomplete  Body fluid culture w Gram Stain     Status: None (Preliminary result)   Collection Time: 10/23/21  7:55 PM   Specimen: Body Fluid  Result Value Ref Range Status  Specimen Description FLUID  Final   Special Requests LEFT KNEE BURSA  Final   Gram Stain   Final    ABUNDANT WBC PRESENT, PREDOMINANTLY PMN MODERATE GRAM POSITIVE COCCI    Culture   Final    ABUNDANT STAPHYLOCOCCUS AUREUS SUSCEPTIBILITIES TO FOLLOW Performed at [facility], [place] [address], [place], [place] [address]    Report Status PENDING  Incomplete  Resp Panel by RT-PCR (Flu A&B, Covid) Nasopharyngeal Swab     Status: None   Collection Time: 10/23/21 10:23 PM   Specimen: Nasopharyngeal Swab; Nasopharyngeal(NP) swabs in vial transport medium  Result Value Ref Range Status   SARS Coronavirus 2 by RT PCR NEGATIVE NEGATIVE Final    Comment: (NOTE) SARS-CoV-2 target nucleic acids are NOT DETECTED.  The SARS-CoV-2 RNA is generally detectable in upper respiratory specimens during the acute phase of infection. The lowest concentration of SARS-CoV-2 viral copies this assay can detect is 138 copies/mL. A negative result does not preclude SARS-Cov-2 infection and should not be used as the sole basis for treatment or other patient management decisions. A negative result may occur with  improper specimen collection/handling, submission of specimen other than nasopharyngeal swab, presence of viral mutation(s) within the areas targeted by this assay, and inadequate number of viral copies(<138 copies/mL). A negative result must be combined  with clinical observations, patient history, and epidemiological information. The expected result is Negative.  Fact Sheet for Patients:  EntrepreneurPulse.com.au  Fact Sheet for Healthcare Providers:  IncredibleEmployment.be  This test is Patrick [name] t yet approved or cleared by the [place] FDA and  has been authorized for detection and/or diagnosis of SARS-CoV-2 by FDA under an Emergency Use Authorization (EUA). This EUA will remain  in effect (meaning this test can be used) for the duration of the COVID-19 declaration under Section 564(b)(1) of the Act, 21 U.S.C.section 360bbb-3(b)(1), unless the authorization is terminated  or revoked sooner.       Influenza A by PCR NEGATIVE NEGATIVE Final   Influenza B by PCR NEGATIVE NEGATIVE Final    Comment: (NOTE) The Xpert Xpress SARS-CoV-2/FLU/RSV plus assay is intended as an aid in the diagnosis of influenza from Nasopharyngeal swab specimens and should not be used as a sole basis for treatment. Nasal washings and aspirates are unacceptable for Xpert Xpress SARS-CoV-2/FLU/RSV testing.  Fact Sheet for Patients: EntrepreneurPulse.com.au  Fact Sheet for Healthcare Providers: IncredibleEmployment.be  This test is not yet approved or cleared by the [place] FDA and has been authorized for detection and/or diagnosis of SARS-CoV-2 by FDA under an Emergency Use Authorization (EUA). This EUA will remain in effect (meaning this test can be used) for the duration of the COVID-19 declaration under Section 564(b)(1) of the Act, 21 U.S.C. section 360bbb-3(b)(1), unless the authorization is terminated or revoked.  Performed at [facility], [place] [address] [place], [place] [address]   Surgical PCR screen     Status: None   Collection Time: 10/24/21  9:01 AM   Specimen: Nasal Mucosa; Nasal Swab  Result Value Ref Range Status   MRSA, PCR NEGATIVE NEGATIVE Final    Staphylococcus aureus NEGATIVE NEGATIVE Final    Comment: (NOTE) The Xpert SA Assay (FDA approved for NASAL specimens in patients 74 years of age and older), is one component of a comprehensive surveillance program. It is not intended to diagnose infection nor to guide or monitor treatment. Performed at [facility], [place] [address] [place], [place] [address]   Aerobic/Anaerobic  Culture w Gram Stain (surgical/deep wound)     Status: None (Preliminary result)   Collection Time: 10/24/21 12:50 PM   Specimen: PATH Soft tissue  Result Value Ref Range Status   Specimen Description TISSUE  Final   Special Requests BURSA LEFT KNEE SPEC B  Final   Gram Stain   Final    MODERATE WBC PRESENT,BOTH PMN AND MONONUCLEAR FEW GRAM POSITIVE COCCI    Culture   Final    CULTURE REINCUBATED FOR BETTER GROWTH Performed at [facility], [place] [address] [place], [place] [address]    Report Status PENDING  Incomplete    Patrick Madeira, MSN, NP-C [place] [facility] [place] Pager: [phone]  10/25/2021 12:39 PM

## 2021-10-26 ENCOUNTER — Other Ambulatory Visit (HOSPITAL_COMMUNITY): Payer: Self-pay

## 2021-10-26 DIAGNOSIS — L03119 Cellulitis of unspecified part of limb: Secondary | ICD-10-CM

## 2021-10-26 LAB — BODY FLUID CULTURE W GRAM STAIN

## 2021-10-26 LAB — BASIC METABOLIC PANEL
Anion gap: 8 (ref 5–15)
BUN: 13 mg/dL (ref 8–23)
CO2: 24 mmol/L (ref 22–32)
Calcium: 8.6 mg/dL — ABNORMAL LOW (ref 8.9–10.3)
Chloride: 104 mmol/L (ref 98–111)
Creatinine, Ser: 0.54 mg/dL — ABNORMAL LOW (ref 0.61–1.24)
GFR, Estimated: >60 mL/min (ref >60)
Glucose, Bld: 141 mg/dL — ABNORMAL HIGH (ref 70–99)
Potassium: 3.1 mmol/L — ABNORMAL LOW (ref 3.5–5.1)
Sodium: 136 mmol/L (ref 135–145)

## 2021-10-26 LAB — CBC
HCT: 35.4 % — ABNORMAL LOW (ref 39.0–52.0)
Hemoglobin: 11.9 g/dL — ABNORMAL LOW (ref 13.0–17.0)
MCH: 28.5 pg (ref 26.0–34.0)
MCHC: 33.6 g/dL (ref 30.0–36.0)
MCV: 84.7 fL (ref 80.0–100.0)
Platelets: 195 10*3/uL (ref 150–400)
RBC: 4.18 MIL/uL — ABNORMAL LOW (ref 4.22–5.81)
RDW: 13 % (ref 11.5–15.5)
WBC: 7.5 10*3/uL (ref 4.0–10.5)
nRBC: 0 % (ref 0.0–0.2)

## 2021-10-26 LAB — GLUCOSE, CAPILLARY
Glucose-Capillary: 136 mg/dL — ABNORMAL HIGH (ref 70–99)
Glucose-Capillary: 151 mg/dL — ABNORMAL HIGH (ref 70–99)
Glucose-Capillary: 158 mg/dL — ABNORMAL HIGH (ref 70–99)
Glucose-Capillary: 175 mg/dL — ABNORMAL HIGH (ref 70–99)

## 2021-10-26 LAB — MAGNESIUM: Magnesium: 2 mg/dL (ref 1.7–2.4)

## 2021-10-26 MED ORDER — CEFADROXIL 500 MG PO CAPS
500.0000 mg | ORAL_CAPSULE | Freq: Two times a day (BID) | ORAL | 0 refills | Status: AC
  Filled 2021-10-26: qty 40, 20d supply, fill #0

## 2021-10-26 MED ORDER — POTASSIUM CHLORIDE CRYS ER 20 MEQ PO TBCR
40.0000 meq | EXTENDED_RELEASE_TABLET | ORAL | Status: AC
  Administered 2021-10-26 (×2): 40 meq via ORAL
  Filled 2021-10-26 (×2): qty 2

## 2021-10-26 MED ORDER — INSULIN PEN NEEDLE 32G X 4 MM MISC
0 refills | Status: DC
  Filled 2021-10-26: qty 100, 25d supply, fill #0

## 2021-10-26 MED ORDER — NOVOLOG FLEXPEN 100 UNIT/ML ~~LOC~~ SOPN
14.0000 [IU] | PEN_INJECTOR | Freq: Two times a day (BID) | SUBCUTANEOUS | 2 refills | Status: DC
  Filled 2021-10-26: qty 15, fill #0

## 2021-10-26 MED ORDER — POTASSIUM CHLORIDE CRYS ER 20 MEQ PO TBCR
40.0000 meq | EXTENDED_RELEASE_TABLET | Freq: Once | ORAL | Status: DC
Start: 2021-10-26 — End: 2021-10-26

## 2021-10-26 MED ORDER — ACETAMINOPHEN 325 MG PO TABS
650.0000 mg | ORAL_TABLET | Freq: Four times a day (QID) | ORAL | Status: DC | PRN
Start: 2021-10-26 — End: 2023-03-06

## 2021-10-26 MED ORDER — ACCU-CHEK SOFTCLIX LANCETS MISC
0 refills | Status: DC
  Filled 2021-10-26: qty 100, 25d supply, fill #0

## 2021-10-26 MED ORDER — INSULIN ASPART PROT & ASPART (70-30 MIX) 100 UNIT/ML PEN
15.0000 [IU] | PEN_INJECTOR | Freq: Two times a day (BID) | SUBCUTANEOUS | 0 refills | Status: DC
  Filled 2021-10-26: qty 9, 30d supply, fill #0

## 2021-10-26 MED ORDER — BLOOD GLUCOSE MONITOR SYSTEM W/DEVICE KIT
PACK | 0 refills | Status: DC
  Filled 2021-10-26: qty 1, 30d supply, fill #0

## 2021-10-26 MED ORDER — HYDROCODONE-ACETAMINOPHEN 5-325 MG PO TABS
1.0000 | ORAL_TABLET | Freq: Four times a day (QID) | ORAL | Status: DC | PRN
  Administered 2021-10-26: 06:00:00 1 via ORAL
  Filled 2021-10-26: qty 1

## 2021-10-26 MED ORDER — ACCU-CHEK GUIDE VI STRP
ORAL_STRIP | 0 refills | Status: DC
  Filled 2021-10-26: qty 100, 25d supply, fill #0

## 2021-10-26 MED ORDER — CEFADROXIL 500 MG PO CAPS
500.0000 mg | ORAL_CAPSULE | Freq: Two times a day (BID) | ORAL | Status: DC
  Administered 2021-10-26: 13:00:00 500 mg via ORAL
  Filled 2021-10-26 (×2): qty 1

## 2021-10-26 MED ORDER — INSULIN ASPART PROT & ASPART (70-30 MIX) 100 UNIT/ML ~~LOC~~ SUSP
14.0000 [IU] | Freq: Two times a day (BID) | SUBCUTANEOUS | Status: DC
  Administered 2021-10-26: 12:00:00 14 [IU] via SUBCUTANEOUS
  Filled 2021-10-26: qty 10

## 2021-10-26 MED ORDER — NOVOLOG FLEXPEN 100 UNIT/ML ~~LOC~~ SOPN
15.0000 [IU] | PEN_INJECTOR | Freq: Two times a day (BID) | SUBCUTANEOUS | 2 refills | Status: DC
  Filled 2021-10-26: qty 9, 30d supply, fill #0

## 2021-10-26 MED ORDER — LISINOPRIL 10 MG PO TABS
10.0000 mg | ORAL_TABLET | Freq: Every day | ORAL | 0 refills | Status: DC
  Filled 2021-10-26: qty 30, 30d supply, fill #0

## 2021-10-26 NOTE — Progress Notes (Signed)
Pt had 9 beats of nonsustained VT reported by CCMD paged MD G. Shalhoub by [employer]. Pt also report TyL not relieving pain. Pt  report pain 5-6 pain med has been in system for 1-2hrs.

## 2021-10-26 NOTE — Progress Notes (Signed)
[facility]   Reason for visit: Follow up on bursitis  Interval History: no new issues, no rash, no diarrhea.  WBC remains wnl, no fever.  Improved pain.  2 daughters at bedside.  Day 4 total antibiotics  Physical Exam: Constitutional:  Vitals:   10/26/21 0446 10/26/21 0735  BP: (!) 174/91 (!) 187/97  Pulse: 74 77  Resp: 18 18  Temp: 99.2 F (37.3 C) 99 F (37.2 C)  SpO2: 98% 97%   patient appears in NAD Respiratory: Normal respiratory effort; CTA B Cardiovascular: RRR MS: + vac in place  Review of Systems: Constitutional: negative for fevers and chills Gastrointestinal: negative for nausea and diarrhea  Lab Results  Component Value Date   WBC 7.5 10/26/2021   HGB 11.9 (L) 10/26/2021   HCT 35.4 (L) 10/26/2021   MCV 84.7 10/26/2021   PLT 195 10/26/2021    Lab Results  Component Value Date   CREATININE 0.54 (L) 10/26/2021   BUN 13 10/26/2021   NA 136 10/26/2021   K 3.1 (L) 10/26/2021   CL 104 10/26/2021   CO2 24 10/26/2021    Lab Results  Component Value Date   ALT 36 03/22/2014   AST 54 (H) 03/22/2014   ALKPHOS 170 (H) 03/22/2014     Microbiology: Recent Results (from the past 240 hour(s))  Culture, blood (routine x 2)     Status: None (Preliminary result)   Collection Time: 10/23/21  2:32 AM   Specimen: BLOOD  Result Value Ref Range Status   Specimen Description BLOOD SITE NOT SPECIFIED  Final   Special Requests   Final    BOTTLES DRAWN AEROBIC AND ANAEROBIC Blood Culture adequate volume   Culture   Final    NO GROWTH 2 DAYS Performed at [facility], [address] [place], [place] [address]    Report Status PENDING  Incomplete  Culture, blood (routine x 2)     Status: None (Preliminary result)   Collection Time: 10/23/21 12:35 PM   Specimen: BLOOD RIGHT ARM  Result Value Ref Range Status   Specimen Description BLOOD RIGHT ARM  Final   Special Requests   Final    BOTTLES DRAWN AEROBIC AND ANAEROBIC Blood  Culture results may not be optimal due to an inadequate volume of blood received in culture bottles   Culture   Final    NO GROWTH 3 DAYS Performed at [facility], [address], [place], [place] [address]    Report Status PENDING  Incomplete  Body fluid culture w Gram Stain     Status: None   Collection Time: 10/23/21  7:55 PM   Specimen: Body Fluid  Result Value Ref Range Status   Specimen Description FLUID  Final   Special Requests LEFT KNEE BURSA  Final   Gram Stain   Final    ABUNDANT WBC PRESENT, PREDOMINANTLY PMN MODERATE GRAM POSITIVE COCCI Performed at [facility], [address] [place], [place] [address]    Culture ABUNDANT STAPHYLOCOCCUS AUREUS  Final   Report Status 10/26/2021 FINAL  Final   Organism ID, Bacteria STAPHYLOCOCCUS AUREUS  Final      Susceptibility   Staphylococcus aureus - MIC*    CIPROFLOXACIN <=0.5 SENSITIVE Sensitive     ERYTHROMYCIN <=0.25 SENSITIVE Sensitive     GENTAMICIN <=0.5 SENSITIVE Sensitive     OXACILLIN <=0.25 SENSITIVE Sensitive     TETRACYCLINE <=1 SENSITIVE Sensitive     VANCOMYCIN <=  0.5 SENSITIVE Sensitive     TRIMETH/SULFA <=10 SENSITIVE Sensitive     CLINDAMYCIN <=0.25 SENSITIVE Sensitive     RIFAMPIN <=0.5 SENSITIVE Sensitive     Inducible Clindamycin NEGATIVE Sensitive     * ABUNDANT STAPHYLOCOCCUS AUREUS  Resp Panel by RT-PCR (Flu A&B, Covid) Nasopharyngeal Swab     Status: None   Collection Time: 10/23/21 10:23 PM   Specimen: Nasopharyngeal Swab; Nasopharyngeal(NP) swabs in vial transport medium  Result Value Ref Range Status   SARS Coronavirus 2 by RT PCR NEGATIVE NEGATIVE Final    Comment: (NOTE) SARS-CoV-2 target nucleic acids are NOT DETECTED.  The SARS-CoV-2 RNA is generally detectable in upper respiratory specimens during the acute phase of infection. The lowest concentration of SARS-CoV-2 viral copies this assay can detect is 138 copies/mL. A negative result does not preclude SARS-Cov-2 infection  and should not be used as the sole basis for treatment or other patient management decisions. A negative result may occur with  improper specimen collection/handling, submission of specimen other than nasopharyngeal swab, presence of viral mutation(s) within the areas targeted by this assay, and inadequate number of viral copies(<138 copies/mL). A negative result must be combined with clinical observations, patient history, and epidemiological information. The expected result is Negative.  Fact Sheet for Patients:  BloggerCourse.com  Fact Sheet for Healthcare Providers:  SeriousBroker.it  This test is no t yet approved or cleared by the [place] FDA and  has been authorized for detection and/or diagnosis of SARS-CoV-2 by FDA under an Emergency Use Authorization (EUA). This EUA will remain  in effect (meaning this test can be used) for the duration of the COVID-19 declaration under Section 564(b)(1) of the Act, 21 U.S.C.section 360bbb-3(b)(1), unless the authorization is terminated  or revoked sooner.       Influenza A by PCR NEGATIVE NEGATIVE Final   Influenza B by PCR NEGATIVE NEGATIVE Final    Comment: (NOTE) The Xpert Xpress SARS-CoV-2/FLU/RSV plus assay is intended as an aid in the diagnosis of influenza from Nasopharyngeal swab specimens and should not be used as a sole basis for treatment. Nasal washings and aspirates are unacceptable for Xpert Xpress SARS-CoV-2/FLU/RSV testing.  Fact Sheet for Patients: BloggerCourse.com  Fact Sheet for Healthcare Providers: SeriousBroker.it  This test is not yet approved or cleared by the [place] FDA and has been authorized for detection and/or diagnosis of SARS-CoV-2 by FDA under an Emergency Use Authorization (EUA). This EUA will remain in effect (meaning this test can be used) for the duration of the COVID-19 declaration  under Section 564(b)(1) of the Act, 21 U.S.C. section 360bbb-3(b)(1), unless the authorization is terminated or revoked.  Performed at [facility], [address] [place], [place] [address]   Surgical PCR screen     Status: None   Collection Time: 10/24/21  9:01 AM   Specimen: Nasal Mucosa; Nasal Swab  Result Value Ref Range Status   MRSA, PCR NEGATIVE NEGATIVE Final   Staphylococcus aureus NEGATIVE NEGATIVE Final    Comment: (NOTE) The Xpert SA Assay (FDA approved for NASAL specimens in patients 34 years of age and older), is one component of a comprehensive surveillance program. It is not intended to diagnose infection nor to guide or monitor treatment. Performed at [facility], [address] [place], [place] [address]   Aerobic/Anaerobic Culture w Gram Stain (surgical/deep wound)     Status: None (Preliminary result)   Collection Time: 10/24/21 12:50 PM   Specimen: PATH Soft tissue  Result Value Ref Range Status   Specimen Description TISSUE  Final   Special Requests BURSA LEFT KNEE SPEC B  Final   Gram Stain   Final    MODERATE WBC PRESENT,BOTH PMN AND MONONUCLEAR FEW GRAM POSITIVE COCCI Performed at [facility], [address] [place], [place] [address]    Culture   Final    FEW STAPHYLOCOCCUS AUREUS NO ANAEROBES ISOLATED; CULTURE IN PROGRESS FOR 5 DAYS    Report Status PENDING  Incomplete   Organism ID, Bacteria STAPHYLOCOCCUS AUREUS  Final      Susceptibility   Staphylococcus aureus - MIC*    CIPROFLOXACIN <=0.5 SENSITIVE Sensitive     ERYTHROMYCIN <=0.25 SENSITIVE Sensitive     GENTAMICIN <=0.5 SENSITIVE Sensitive     OXACILLIN <=0.25 SENSITIVE Sensitive     TETRACYCLINE <=1 SENSITIVE Sensitive     VANCOMYCIN <=0.5 SENSITIVE Sensitive     TRIMETH/SULFA <=10 SENSITIVE Sensitive     CLINDAMYCIN <=0.25 SENSITIVE Sensitive     RIFAMPIN <=0.5 SENSITIVE Sensitive     Inducible Clindamycin NEGATIVE Sensitive     * FEW STAPHYLOCOCCUS AUREUS   Aerobic/Anaerobic Culture w Gram Stain (surgical/deep wound)     Status: None (Preliminary result)   Collection Time: 10/24/21 12:50 PM   Specimen: PATH Soft tissue  Result Value Ref Range Status   Specimen Description WOUND  Final   Special Requests BURSA LEFT KNEE SPEC A  Final   Gram Stain   Final    RARE WBC PRESENT, PREDOMINANTLY PMN RARE GRAM POSITIVE COCCI IN CLUSTERS Performed at [facility], [address] [place], [place] [address]    Culture   Final    MODERATE STAPHYLOCOCCUS AUREUS NO ANAEROBES ISOLATED; CULTURE IN PROGRESS FOR 5 DAYS    Report Status PENDING  Incomplete   Organism ID, Bacteria STAPHYLOCOCCUS AUREUS  Final      Susceptibility   Staphylococcus aureus - MIC*    CIPROFLOXACIN <=0.5 SENSITIVE Sensitive     ERYTHROMYCIN <=0.25 SENSITIVE Sensitive     GENTAMICIN <=0.5 SENSITIVE Sensitive     OXACILLIN <=0.25 SENSITIVE Sensitive     TETRACYCLINE <=1 SENSITIVE Sensitive     VANCOMYCIN 1 SENSITIVE Sensitive     TRIMETH/SULFA <=10 SENSITIVE Sensitive     CLINDAMYCIN <=0.25 SENSITIVE Sensitive     RIFAMPIN <=0.5 SENSITIVE Sensitive     Inducible Clindamycin NEGATIVE Sensitive     * MODERATE STAPHYLOCOCCUS AUREUS    Impression/Plan:  1. Bursitis - MSSA positive in culture and now starting cefadroxil.  Tolerating and will continue for 3 weeks.    2.  Diabetes - poorly controlled and needs to greatly improve this  Has follow up with a new PCP in January.    3. HTN - also poorly controlled and now on medication.  Has follow up as above.   He has follow up with me 11/13/21

## 2021-10-26 NOTE — Progress Notes (Signed)
Inpatient Diabetes Program Recommendations  AACE/ADA: New Consensus Statement on Inpatient Glycemic Control (2015)  Target Ranges:  Prepandial:   less than 140 mg/dL      Peak postprandial:   less than 180 mg/dL (1-2 hours)      Critically ill patients:  140 - 180 mg/dL   Lab Results  Component Value Date   GLUCAP 175 (H) 10/26/2021   HGBA1C 12.5 (H) 10/24/2021     Spoke with patient and daughter again to reinforce concepts discussed 12/29. Reviewed patient's current A1c of 12.5%. Explained what a A1c is and what it measures. Also reviewed goal A1c with patient, importance of good glucose control @ home, and blood sugar goals. Reviewed patho of DM, need for insulin, role of pancreas, impact of poor glycemic control, relion insulin, survival skills, how to take 70/30, importance of not missing doses, vascular changes and commorbidities. Patient will need a meter. Will provide. Reviewed frequency of blood sugar checks and when to call MD.  Educated patient and daughter on insulin pen use at home. Reviewed contents of insulin flexpen starter kit. Reviewed all steps if insulin pen including attachment of needle, 2-unit air shot, dialing up dose, giving injection, removing needle, disposal of sharps, storage of unused insulin, disposal of insulin etc. Daughter able to provide successful return demonstration. Also reviewed troubleshooting with insulin pen. MD to give patient Rxs for insulin pens and insulin pen needles.  Per daughter, discussed cost of 70/30 and she denies issues with purchasing from [facility]. Secure chat sent to md.  Thanks, Bronson Curb, MSN, RNC-OB Diabetes Coordinator [phone] ([id number])

## 2021-10-26 NOTE — Progress Notes (Signed)
Physical Therapy Treatment Patient Details Name: Patrick [name] MRN: [id number] DOB: Dec 18, 1945 Today's Date: 10/26/2021   History of Present Illness Patrick [name] is a 75 y.o. male admitted 12/27  with 1 month history of painful left knee. This quickly worsened 1 week ago with rapidly spreading erythema, swelling, pain and intolerance to walking despite oral antibiotics and required hospital admission for surgical management. He is now POD 1 following I&D with bursectomy. PMH: DM, HTN    PT Comments    Pt admitted with above diagnosis. Pt progressing well and incr distance today. Daughter aware of how to assist with mobility.  Pt currently with functional limitations due to balance and endurance deficits. Pt will benefit from skilled PT to increase their independence and safety with mobility to allow discharge to the venue listed below.      Recommendations for follow up therapy are one component of a multi-disciplinary discharge planning process, led by the attending physician.  Recommendations may be updated based on patient status, additional functional criteria and insurance authorization.  Follow Up Recommendations  No PT follow up     Assistance Recommended at Discharge PRN  Equipment Recommendations  Rolling walker (2 wheels)    Recommendations for Other Services       Precautions / Restrictions Precautions Precautions: Fall Restrictions Weight Bearing Restrictions: No     Mobility  Bed Mobility Overal bed mobility: Independent                  Transfers Overall transfer level: Needs assistance Equipment used: Rolling walker (2 wheels) Transfers: Sit to/from Stand Sit to Stand: Supervision                Ambulation/Gait Ambulation/Gait assistance: Min guard Gait Distance (Feet): 250 Feet Assistive device: Rolling walker (2 wheels) Gait Pattern/deviations: Step-to pattern;Decreased stance time - left;Decreased weight shift to left;Antalgic    Gait velocity interpretation: <1.31 ft/sec, indicative of household ambulator   General Gait Details: Pt was able to ambulate with RW with steady gait overall. Daughter present and is aware of how to guard pt with gaitbelt.   Stairs             Wheelchair Mobility    Modified Rankin (Stroke Patients Only)       Balance Overall balance assessment: Needs assistance Sitting-balance support: No upper extremity supported;Feet supported Sitting balance-Leahy Scale: Fair     Standing balance support: Bilateral upper extremity supported;During functional activity Standing balance-Leahy Scale: Poor Standing balance comment: Pt was able to stand with RW wtih min guard assist to supervision.                            Cognition Arousal/Alertness: Awake/alert Behavior During Therapy: WFL for tasks assessed/performed Overall Cognitive Status: Within Functional Limits for tasks assessed                                          Exercises General Exercises - Lower Extremity Ankle Circles/Pumps: AROM;Both;10 reps;Seated Quad Sets: AROM;Both;10 reps;Supine Long Arc Quad: AROM;Both;10 reps;Seated Heel Slides: AAROM;Both;10 reps;Supine Hip ABduction/ADduction: AROM;Both;10 reps;Supine    General Comments        Pertinent Vitals/Pain Pain Assessment: Faces Faces Pain Scale: Hurts even more Breathing: normal Negative Vocalization: none Pain Location: left knee Pain Descriptors / Indicators: Aching;Grimacing;Guarding Pain Intervention(s): Limited activity within patient's tolerance;Monitored during  session;Repositioned    Home Living                          Prior Function            PT Goals (current goals can now be found in the care plan section) Acute Rehab PT Goals Patient Stated Goal: to go home Progress towards PT goals: Progressing toward goals    Frequency    Min 5X/week      PT Plan Current plan remains  appropriate    Co-evaluation              AM-PAC PT "6 Clicks" Mobility   Outcome Measure  Help needed turning from your back to your side while in a flat bed without using bedrails?: None Help needed moving from lying on your back to sitting on the side of a flat bed without using bedrails?: A Little Help needed moving to and from a bed to a chair (including a wheelchair)?: A Little Help needed standing up from a chair using your arms (e.g., wheelchair or bedside chair)?: A Little Help needed to walk in hospital room?: A Little Help needed climbing 3-5 steps with a railing? : A Little 6 Click Score: 19    End of Session Equipment Utilized During Treatment: Gait belt Activity Tolerance: Patient limited by fatigue Patient left: in chair;with call bell/phone within reach;with chair alarm set;with family/visitor present Nurse Communication: Mobility status PT Visit Diagnosis: Muscle weakness (generalized) (M62.81);Pain Pain - Right/Left: Left Pain - part of body: Leg     Time: 1145-1156 PT Time Calculation (min) (ACUTE ONLY): 11 min  Charges:  $Gait Training: 8-22 mins                     Lorisa Scheid M,PT Acute Rehab Services [phone] (pager)    Bevelyn Buckles 10/26/2021, 3:14 PM

## 2021-10-26 NOTE — Progress Notes (Signed)
Orthopaedics Daily Progress Note   10/26/2021   12:53 PM  Patrick [name] is a 75 y.o. male 2 Days Post-Op s/p IRRIGATION AND DEBRIDEMENT OF KNEE AND EXCISION PRE-PATELLA BURSA  Subjective Pain appropriately controlled.  Denies nausea, vomiting, or fevers.   Objective Vitals:   10/26/21 0446 10/26/21 0735  BP: (!) 174/91 (!) 187/97  Pulse: 74 77  Resp: 18 18  Temp: 99.2 F (37.3 C) 99 F (37.2 C)  SpO2: 98% 97%    Intake/Output Summary (Last 24 hours) at 10/26/2021 1253 Last data filed at 10/26/2021 9735 Gross per 24 hour  Intake 720 ml  Output 250 ml  Net 470 ml     Physical Exam LLE: Dressing clean, dry, and intact and VAC intact and holding suction +DF/PF/EHL SILT SP/DP/T +DP/PT and WWP distally  Assessment 75 y.o. male s/p Procedure(s) (LRB): IRRIGATION AND DEBRIDEMENT OF KNEE AND EXCISION PRE-PATELLA BURSA (Left)  The patient's uncontrolled diabetes is the underlying etiology for this presentation and condition.   Plan Mobility: Out of bed with PT/OT Pain control: Continue to wean/titrate to appropriate oral regimen DVT Prophylaxis: Per primary team Further surgical plans: None RUE: No restrictions LUE: No restrictions RLE: No restrictions LLE: Weightbearing as tolerated Disposition: Per primary team Dressing care:  Continue negative pressure therapy with wound VAC while patient is in hospital.  Transition to [facility] portable VAC at the time of discharge.   Conversion to dry dressing at 1 week.  PLEASE INSTRUCT PATIENT/RESPONSIBLE PARTY IN USE OF PRAVEENA VAC MACHINE, INCLUDING REGULARLY PLUGGING IT IN TO CHARGE, PRIOR TO DISCHARGE!!! Follow-up: Please call [facility] [phone]) to schedule follow-op appointment for 1 weeks after surgery. Additional recommendations: Consult Infectious Disease for follow-up and management of antibiotics following discharge, as antibiotics will not be managed by orthopaedic  surgery. Establish plan of care for diabetes post-discharge management prior to discharge; with uncontrolled diabetes (A1c of 12) patient is at risk for delayed healing and ongoing wound complications as well as subsequent infections, and difficulty treating the present infection.  The patient's uncontrolled diabetes is the underlying etiology for this presentation and condition.  Healing and resolution of the patient's current condition depends on appropriate ongoing glucose control and diabetes management.  Diabetes will not be managed by orthopaedic surgery.  I have verified that my discharge instructions and follow-up information has been entered in the Discharge Navigator in Epic.  These should automatically populate in the AVS.  Please print the AVS in its entirety and ensure that the patient or a responsible party has a complete copy of the AVS before they are discharged.  If there are questions regarding discharge instructions or follow-up before the AVS is generated, please check the Discharge Navigator before attempting to contact the surgeon/office.  If unsure how to access the Discharge Navigator or the information contained in the Discharge Navigator, or how to generate/print the AVS, please contact the appropriate Proofreader.   Ernestina Columbia M.D. Orthopaedic Surgery [facility]

## 2021-10-26 NOTE — Care Management (Signed)
Patient entered in [facility] with no co pays except for Tylenol ( over the counter medications are not covered by [facility]). [facility] Pharmacy will bring medications to patient's room prior to discharge

## 2021-10-26 NOTE — Progress Notes (Addendum)
PROGRESS NOTE  Patrick [name] WPY:[id number] DOB: 1946-02-08   PCP: Pcp, No  Patient is from: Home  DOA: 10/23/2021 LOS: 2  Chief complaints:  Chief Complaint  Patient presents with   Knee Pain     Brief Narrative / Interim history: 75 year old M with PMH of DM-2 and HTN not on any medication presented to ED with left knee swelling and pain for about 1 week that has not improved with oral antibiotics, and admitted with prepatellar bursitis/abscess -  CRP 15.1.  ESR 115.  CT showed rim-enhancing fluid collection measuring 6.1 x 1 x 2.7 cm within the soft tissue anterior to the knee concerning for abscess versus bursitis.   -Underwent needle aspiration 12/27, fluid stated with intracellular CPP but unable to perform cell count due to clot.  Culture pending.   Started on cefepime vancomycin.  Orthopedic surgery consulted -Underwent excision of bursa and wound VAC 12/28  Subjective: -Feels better today  Objective: Vitals:   10/25/21 1915 10/25/21 2118 10/26/21 0446 10/26/21 0735  BP:  (!) 182/92 (!) 174/91 (!) 187/97  Pulse:  70 74 77  Resp:  20 18 18   Temp:  98.1 F (36.7 C) 99.2 F (37.3 C) 99 F (37.2 C)  TempSrc:  Oral Oral Oral  SpO2: 98% 98% 98% 97%  Weight:      Height:        Examination:  Gen: Awake, Alert, Oriented X 3,  HEENT: no JVD Lungs: Good air movement bilaterally, CTAB CVS: S1S2/RRR Abd: soft, Non tender, non distended, BS present Extremities: Left knee with dressing, wound VAC noted, Ace wrap  SKIN: As above NEURO: Awake, alert and oriented appropriately.  No apparent focal neuro deficit. PSYCH: Calm. Normal affect.   Assessment & Plan:  Left septic prepatellar bursitis/abscess  - Markedly elevated inflammatory markers.  Fluid study with CPP. -Underwent needle aspiration in the ED -Gram stain with moderate gram-positive cocci from 12/27 and 12/28 -Remains on IV vancomycin and cefepime -Orthopedics following, underwent excision of left  prepatellar bursa with application of wound VAC 12/28 Dr. Mable Fill, cultures as above with staff aureus -Appreciate infectious disease input, await sensitivities -Ambulate, discharge planning  Uncontrolled DM-2 with hyperglycemia: Not on medication at home.  Wasn't able to see PCP due to financial issues. -Hemoglobin A1c was 12.5 -CBGs poorly controlled, switch to insulin 70/30, insulin teaching, patient's daughter lives with him will be coming by to learn insulin administration today -TOC consult for med assistance and PCP  Uncontrolled hypertension: Not on medication at home. -Increase lisinopril to 20 mg daily -Continue hydralazine as needed  Social issues-patient's daughter reports difficulty affording medication or seeing a doctor. -TOC consulted for PCP and medication assistance  Body mass index is 26.72 kg/m.         DVT prophylaxis:  enoxaparin (LOVENOX) injection 40 mg Start: 10/25/21 1115 SCDs Start: 10/23/21 2105  Code Status: Full code Family Communication: Updated patient's son at bedside. Level of care: Telemetry Medical Status is: inpatient    Procedures,  Procedures:  12/27-needle aspiration of anterior left knee fluid collection excision of left prepatellar bursa, application of wound VAC, Dr. Mable Fill 12/28  Consultants:  Orthopedic surgery   Sch Meds:  Scheduled Meds:  atorvastatin  40 mg Oral Daily   cefadroxil  500 mg Oral BID   enoxaparin (LOVENOX) injection  40 mg Subcutaneous Q24H   insulin aspart  0-9 Units Subcutaneous Q4H   insulin aspart protamine- aspart  14 Units Subcutaneous BID WC   lisinopril  20 mg Oral Daily   mupirocin ointment  1 application Nasal BID   Continuous Infusions:   PRN Meds:.acetaminophen **OR** acetaminophen, hydrALAZINE, HYDROcodone-acetaminophen  Antimicrobials: Anti-infectives (From admission, onward)    Start     Dose/Rate Route Frequency Ordered Stop   10/26/21 1200  cefadroxil (DURICEF) capsule 500 mg         500 mg Oral 2 times daily 10/26/21 1113 11/16/21 0959   10/26/21 0000  cefadroxil (DURICEF) 500 MG capsule        500 mg Oral 2 times daily 10/26/21 1115 11/15/21 2359   10/25/21 1100  vancomycin (VANCOCIN) IVPB 1000 mg/200 mL premix  Status:  Discontinued        1,000 mg 200 mL/hr over 60 Minutes Intravenous Every 36 hours 10/23/21 2259 10/24/21 0757   10/24/21 2200  vancomycin (VANCOCIN) IVPB 1000 mg/200 mL premix  Status:  Discontinued        1,000 mg 200 mL/hr over 60 Minutes Intravenous Every 24 hours 10/24/21 0757 10/26/21 1113   10/24/21 1130  ceFAZolin (ANCEF) IVPB 2g/100 mL premix        2 g 200 mL/hr over 30 Minutes Intravenous On call to O.R. 10/24/21 1032 10/24/21 1258   10/24/21 1039  ceFAZolin (ANCEF) 2-4 GM/100ML-% IVPB       Note to Pharmacy: Odelia Gage E: cabinet override      10/24/21 1039 10/24/21 1237   10/24/21 0500  ceFEPIme (MAXIPIME) 2 g in sodium chloride 0.9 % 100 mL IVPB  Status:  Discontinued        2 g 200 mL/hr over 30 Minutes Intravenous Every 8 hours 10/23/21 2112 10/23/21 2302   10/24/21 0500  ceFEPIme (MAXIPIME) 2 g in sodium chloride 0.9 % 100 mL IVPB  Status:  Discontinued        2 g 200 mL/hr over 30 Minutes Intravenous Every 12 hours 10/23/21 2302 10/25/21 1148   10/23/21 2115  ceFEPIme (MAXIPIME) 2 g in sodium chloride 0.9 % 100 mL IVPB  Status:  Discontinued        2 g 200 mL/hr over 30 Minutes Intravenous  Once 10/23/21 2106 10/23/21 2112   10/23/21 2100  vancomycin (VANCOREADY) IVPB 1500 mg/300 mL        1,500 mg 150 mL/hr over 120 Minutes Intravenous  Once 10/23/21 2052 10/24/21 0138   10/23/21 2100  piperacillin-tazobactam (ZOSYN) IVPB 3.375 g        3.375 g 100 mL/hr over 30 Minutes Intravenous  Once 10/23/21 2052 10/23/21 2149        I have personally reviewed the following labs and images: CBC: Recent Labs  Lab 10/19/21 1209 10/23/21 1231 10/24/21 0513 10/25/21 0345 10/26/21 0243  WBC 8.8 8.6 7.9 10.3 7.5  NEUTROABS  6.3 6.8  --   --   --   HGB 13.6 13.0 11.3* 11.2* 11.9*  HCT 41.4 40.8 36.4* 33.9* 35.4*  MCV 85.4 87.2 87.9 86.0 84.7  PLT 215 245 204 194 195   BMP &GFR Recent Labs  Lab 10/19/21 1209 10/23/21 1231 10/24/21 0513 10/25/21 0345 10/26/21 0243  NA 131* 136 138 134* 136  K 4.0 3.9 3.5 3.5 3.1*  CL 100 102 106 103 104  CO2 21* 23 24 23 24   GLUCOSE 381* 383* 227* 211* 141*  BUN 16 20 15 17 13   CREATININE 0.71 0.70 0.48* 0.59* 0.54*  CALCIUM 9.3 9.6 8.7* 8.6* 8.6*  MG  --   --   --  2.3 2.0  PHOS  --   --   --  3.2  --    Estimated Creatinine Clearance: 61.2 mL/min (A) (by C-G formula based on SCr of 0.54 mg/dL (L)). Liver & Pancreas: Recent Labs  Lab 10/25/21 0345  ALBUMIN 2.5*   No results for input(s): LIPASE, AMYLASE in the last 168 hours. No results for input(s): AMMONIA in the last 168 hours. Diabetic: Recent Labs    10/24/21 0513  HGBA1C 12.5*   Recent Labs  Lab 10/25/21 1617 10/25/21 2119 10/26/21 0040 10/26/21 0445 10/26/21 1143  GLUCAP 196* 222* 158* 151* 175*   Cardiac Enzymes: No results for input(s): CKTOTAL, CKMB, CKMBINDEX, TROPONINI in the last 168 hours. No results for input(s): PROBNP in the last 8760 hours. Coagulation Profile: Recent Labs  Lab 10/19/21 1209  INR 1.1   Thyroid Function Tests: No results for input(s): TSH, T4TOTAL, FREET4, T3FREE, THYROIDAB in the last 72 hours. Lipid Profile: Recent Labs    10/25/21 0345  CHOL 157  HDL 31*  LDLCALC 111*  TRIG 73  CHOLHDL 5.1   Anemia Panel: No results for input(s): VITAMINB12, FOLATE, FERRITIN, TIBC, IRON, RETICCTPCT in the last 72 hours. Urine analysis: No results found for: COLORURINE, APPEARANCEUR, LABSPEC, PHURINE, GLUCOSEU, HGBUR, BILIRUBINUR, KETONESUR, PROTEINUR, UROBILINOGEN, NITRITE, LEUKOCYTESUR Sepsis Labs: Invalid input(s): PROCALCITONIN, [place]  Microbiology: Recent Results (from the past 240 hour(s))  Culture, blood (routine x 2)     Status: None  (Preliminary result)   Collection Time: 10/23/21  2:32 AM   Specimen: BLOOD  Result Value Ref Range Status   Specimen Description BLOOD SITE NOT SPECIFIED  Final   Special Requests   Final    BOTTLES DRAWN AEROBIC AND ANAEROBIC Blood Culture adequate volume   Culture   Final    NO GROWTH 2 DAYS Performed at [facility], [address] [place], [place] [address]    Report Status PENDING  Incomplete  Culture, blood (routine x 2)     Status: None (Preliminary result)   Collection Time: 10/23/21 12:35 PM   Specimen: BLOOD RIGHT ARM  Result Value Ref Range Status   Specimen Description BLOOD RIGHT ARM  Final   Special Requests   Final    BOTTLES DRAWN AEROBIC AND ANAEROBIC Blood Culture results may not be optimal due to an inadequate volume of blood received in culture bottles   Culture   Final    NO GROWTH 3 DAYS Performed at [facility], [place] [address], [place], [place] [address]    Report Status PENDING  Incomplete  Body fluid culture w Gram Stain     Status: None   Collection Time: 10/23/21  7:55 PM   Specimen: Body Fluid  Result Value Ref Range Status   Specimen Description FLUID  Final   Special Requests LEFT KNEE BURSA  Final   Gram Stain   Final    ABUNDANT WBC PRESENT, PREDOMINANTLY PMN MODERATE GRAM POSITIVE COCCI Performed at [facility], [address], [place], [place] [address]    Culture ABUNDANT STAPHYLOCOCCUS AUREUS  Final   Report Status 10/26/2021 FINAL  Final   Organism ID, Bacteria STAPHYLOCOCCUS AUREUS  Final      Susceptibility   Staphylococcus aureus - MIC*    CIPROFLOXACIN <=0.5 SENSITIVE Sensitive     ERYTHROMYCIN <=0.25 SENSITIVE Sensitive     GENTAMICIN <=0.5 SENSITIVE Sensitive     OXACILLIN <=0.25 SENSITIVE Sensitive     TETRACYCLINE <=1 SENSITIVE Sensitive  VANCOMYCIN <=0.5 SENSITIVE Sensitive     TRIMETH/SULFA <=10 SENSITIVE Sensitive     CLINDAMYCIN <=0.25 SENSITIVE Sensitive     RIFAMPIN <=0.5 SENSITIVE Sensitive      Inducible Clindamycin NEGATIVE Sensitive     * ABUNDANT STAPHYLOCOCCUS AUREUS  Resp Panel by RT-PCR (Flu A&B, Covid) Nasopharyngeal Swab     Status: None   Collection Time: 10/23/21 10:23 PM   Specimen: Nasopharyngeal Swab; Nasopharyngeal(NP) swabs in vial transport medium  Result Value Ref Range Status   SARS Coronavirus 2 by RT PCR NEGATIVE NEGATIVE Final    Comment: (NOTE) SARS-CoV-2 target nucleic acids are NOT DETECTED.  The SARS-CoV-2 RNA is generally detectable in upper respiratory specimens during the acute phase of infection. The lowest concentration of SARS-CoV-2 viral copies this assay can detect is 138 copies/mL. A negative result does not preclude SARS-Cov-2 infection and should not be used as the sole basis for treatment or other patient management decisions. A negative result may occur with  improper specimen collection/handling, submission of specimen other than nasopharyngeal swab, presence of viral mutation(s) within the areas targeted by this assay, and inadequate number of viral copies(<138 copies/mL). A negative result must be combined with clinical observations, patient history, and epidemiological information. The expected result is Negative.  Fact Sheet for Patients:  EntrepreneurPulse.com.au  Fact Sheet for Healthcare Providers:  IncredibleEmployment.be  This test is no t yet approved or cleared by the [place] FDA and  has been authorized for detection and/or diagnosis of SARS-CoV-2 by FDA under an Emergency Use Authorization (EUA). This EUA will remain  in effect (meaning this test can be used) for the duration of the COVID-19 declaration under Section 564(b)(1) of the Act, 21 U.S.C.section 360bbb-3(b)(1), unless the authorization is terminated  or revoked sooner.       Influenza A by PCR NEGATIVE NEGATIVE Final   Influenza B by PCR NEGATIVE NEGATIVE Final    Comment: (NOTE) The Xpert Xpress  SARS-CoV-2/FLU/RSV plus assay is intended as an aid in the diagnosis of influenza from Nasopharyngeal swab specimens and should not be used as a sole basis for treatment. Nasal washings and aspirates are unacceptable for Xpert Xpress SARS-CoV-2/FLU/RSV testing.  Fact Sheet for Patients: EntrepreneurPulse.com.au  Fact Sheet for Healthcare Providers: IncredibleEmployment.be  This test is not yet approved or cleared by the [place] FDA and has been authorized for detection and/or diagnosis of SARS-CoV-2 by FDA under an Emergency Use Authorization (EUA). This EUA will remain in effect (meaning this test can be used) for the duration of the COVID-19 declaration under Section 564(b)(1) of the Act, 21 U.S.C. section 360bbb-3(b)(1), unless the authorization is terminated or revoked.  Performed at [facility], [place] [address] [place], [place] [address]   Surgical PCR screen     Status: None   Collection Time: 10/24/21  9:01 AM   Specimen: Nasal Mucosa; Nasal Swab  Result Value Ref Range Status   MRSA, PCR NEGATIVE NEGATIVE Final   Staphylococcus aureus NEGATIVE NEGATIVE Final    Comment: (NOTE) The Xpert SA Assay (FDA approved for NASAL specimens in patients 8 years of age and older), is one component of a comprehensive surveillance program. It is not intended to diagnose infection nor to guide or monitor treatment. Performed at [facility], [place] [address] [place],  [address]   Aerobic/Anaerobic Culture w Gram Stain (surgical/deep wound)     Status: None (Preliminary result)   Collection Time: 10/24/21 12:50 PM   Specimen: PATH Soft  tissue  Result Value Ref Range Status   Specimen Description TISSUE  Final   Special Requests BURSA LEFT KNEE SPEC B  Final   Gram Stain   Final    MODERATE WBC PRESENT,BOTH PMN AND MONONUCLEAR FEW GRAM POSITIVE COCCI Performed at [facility], [place] [address], [place], [place]  [address]    Culture   Final    FEW STAPHYLOCOCCUS AUREUS NO ANAEROBES ISOLATED; CULTURE IN PROGRESS FOR 5 DAYS    Report Status PENDING  Incomplete   Organism ID, Bacteria STAPHYLOCOCCUS AUREUS  Final      Susceptibility   Staphylococcus aureus - MIC*    CIPROFLOXACIN <=0.5 SENSITIVE Sensitive     ERYTHROMYCIN <=0.25 SENSITIVE Sensitive     GENTAMICIN <=0.5 SENSITIVE Sensitive     OXACILLIN <=0.25 SENSITIVE Sensitive     TETRACYCLINE <=1 SENSITIVE Sensitive     VANCOMYCIN <=0.5 SENSITIVE Sensitive     TRIMETH/SULFA <=10 SENSITIVE Sensitive     CLINDAMYCIN <=0.25 SENSITIVE Sensitive     RIFAMPIN <=0.5 SENSITIVE Sensitive     Inducible Clindamycin NEGATIVE Sensitive     * FEW STAPHYLOCOCCUS AUREUS  Aerobic/Anaerobic Culture w Gram Stain (surgical/deep wound)     Status: None (Preliminary result)   Collection Time: 10/24/21 12:50 PM   Specimen: PATH Soft tissue  Result Value Ref Range Status   Specimen Description WOUND  Final   Special Requests   Final    BURSA LEFT KNEE SPEC A Performed at [facility], [place] [address], [place], [place] [address]    Gram Stain PENDING  Incomplete   Culture   Final    MODERATE STAPHYLOCOCCUS AUREUS NO ANAEROBES ISOLATED; CULTURE IN PROGRESS FOR 5 DAYS    Report Status PENDING  Incomplete   Organism ID, Bacteria STAPHYLOCOCCUS AUREUS  Final      Susceptibility   Staphylococcus aureus - MIC*    CIPROFLOXACIN <=0.5 SENSITIVE Sensitive     ERYTHROMYCIN <=0.25 SENSITIVE Sensitive     GENTAMICIN <=0.5 SENSITIVE Sensitive     OXACILLIN <=0.25 SENSITIVE Sensitive     TETRACYCLINE <=1 SENSITIVE Sensitive     VANCOMYCIN 1 SENSITIVE Sensitive     TRIMETH/SULFA <=10 SENSITIVE Sensitive     CLINDAMYCIN <=0.25 SENSITIVE Sensitive     RIFAMPIN <=0.5 SENSITIVE Sensitive     Inducible Clindamycin NEGATIVE Sensitive     * MODERATE STAPHYLOCOCCUS AUREUS    Radiology Studies: No results found.    Domenic Polite, MD [facility]  If  7PM-7AM, please contact night-coverage [email or web address] 10/26/2021, 12:07 PM

## 2021-10-26 NOTE — Discharge Summary (Signed)
Physician Discharge Summary  Patrick [name] XBL:[id number] DOB: May 18, 1946 DOA: 10/23/2021  PCP: Pcp, No  Admit date: 10/23/2021 Discharge date: 10/26/2021  Time spent: 79mnutes  Recommendations for Outpatient Follow-up:  Orthopedics Dr. LMable Fillin 1 week with wound VAC PCP MJuluis Mireon 1/24, titrate insulin dose at follow-up   Discharge Diagnoses:  MSSA left prepatellar septic bursitis/abscess    Uncontrolled type 2 diabetes mellitus with hyperglycemia ([place] Hypertension  Discharge Condition: Stable  Diet recommendation: Diabetic  Filed Weights   10/23/21 2300 10/24/21 1101  Weight: 59 kg 60 kg    History of present illness:  75year old M with PMH of DM-2 and HTN not on any medication presented to ED with left knee swelling and pain for about 1 week that has not improved with oral antibiotics, and admitted with prepatellar bursitis/abscess -  CRP 15.1.  ESR 115.  CT showed rim-enhancing fluid collection measuring 6.1 x 1 x 2.7 cm within the soft tissue anterior to the knee concerning for abscess versus bursitis.    Hospital Course:   Left septic prepatellar bursitis/abscess  -Presented to the ED with left knee swelling and pain, which had not improved with 1 week of oral antibiotics -Underwent needle aspiration of left knee prepatellar space in the ED --Orthopedics consulted, underwent excision of left prepatellar bursa with application of wound VAC 12/28 Dr. LMable Fill cultures grew staph aureus-methicillin sensitive -Infectious disease was consulted as well -Treated with IV vancomycin initially, then transition to oral cefadroxil per infectious disease recommendations for 21 more days -Clinically improved and stable, will discharge home on oral antibiotics, patient is advised to follow-up with surgeon Dr. LMable Fillin 1 week with his wound VAC   Uncontrolled DM-2 with hyperglycemia: Not on medication at home.  Wasn't able to see PCP due to financial  issues. -Hemoglobin A1c was 12.5 -CBGs poorly controlled, switched to insulin 70/30 due to cost/uninsured status, insulin teaching completed, patient's daughter that lives with him was taught how to administer insulin  -[place] letter was provided by case management and [place], follow-up was made with Dr. [name] 3 weeks   Uncontrolled hypertension: Not on medication at home. -started on lisinopril    Procedures:  12/27-needle aspiration of anterior left knee fluid collection excision of left prepatellar bursa, application of wound VAC, Dr. LMable Fill12/28 Consultations: Orthopedics, infectious disease  Discharge Exam: Vitals:   10/26/21 0446 10/26/21 0735  BP: (!) 174/91 (!) 187/97  Pulse: 74 77  Resp: 18 18  Temp: 99.2 F (37.3 C) 99 F (37.2 C)  SpO2: 98% 97%  Gen: Awake, Alert, Oriented X 3,  HEENT: no JVD Lungs: Good air movement bilaterally, CTAB CVS: S1S2/RRR Abd: soft, Non tender, non distended, BS present Extremities: Left knee with dressing, wound VAC noted, Ace wrap  SKIN: As above   Discharge Instructions   Discharge Instructions     Diet Carb Modified   Complete by: As directed    Increase activity slowly   Complete by: As directed    Leave dressing on - Keep it clean, dry, and intact until clinic visit   Complete by: As directed       Allergies as of 10/26/2021   No Known Allergies      Medication List     STOP taking these medications    doxycycline 100 MG capsule Commonly known as: VIBRAMYCIN   ibuprofen 200 MG tablet Commonly known as: ADVIL  TAKE these medications    acetaminophen 325 MG tablet Commonly known as: TYLENOL Take 2 tablets (650 mg total) by mouth every 6 (six) hours as needed for mild pain (or Fever >/= 101).   blood glucose meter kit and supplies Dispense based on patient and insurance preference. Use up to four times daily as directed. (FOR ICD-10  E10.9, E11.9).   cefadroxil 500 MG capsule Commonly known as: DURICEF Take 1 capsule (500 mg total) by mouth 2 (two) times daily for 20 days.   lisinopril 10 MG tablet Commonly known as: ZESTRIL Take 1 tablet (10 mg total) by mouth daily. Start taking on: October 27, 2021   NovoLIN 70/30 Kwikpen (70-30) 100 UNIT/ML KwikPen Generic drug: insulin isophane & regular human KwikPen Inject 15 Units into the skin 2 (two) times daily with a meal.   NovoLOG FlexPen 100 UNIT/ML FlexPen Generic drug: insulin aspart Inject 15 Units into the skin 2 (two) times daily with a meal.               Discharge Care Instructions  (From admission, onward)           Start     Ordered   10/26/21 0000  Leave dressing on - Keep it clean, dry, and intact until clinic visit        10/26/21 1515           No Known Allergies  Follow-up Information     [name], MD. Schedule an appointment as soon as possible for a visit in 1 week(s).   Specialty: Orthopedic Surgery Why: For wound re-check Contact information: [place] [address] [phone]         Kerin Perna, NP Follow up.   Specialty: Internal Medicine Why: November 20, 2021 at 1:30 pm Contact information: [place] [address] [phone]                  The results of significant diagnostics from this hospitalization (including imaging, microbiology, ancillary and laboratory) are listed below for reference.    Significant Diagnostic Studies: CT KNEE LEFT W CONTRAST  Result Date: 10/23/2021 CLINICAL DATA:  Soft tissue infection redness and pain EXAM: CT OF THE left KNEE WITH CONTRAST TECHNIQUE: Multidetector CT imaging was performed following the standard protocol during bolus administration of intravenous contrast. CONTRAST:  54m OMNIPAQUE IOHEXOL 350 MG/ML SOLN COMPARISON:  Radiograph 10/23/2021 FINDINGS: Bones/Joint/Cartilage No fracture or  malalignment. Mild medial joint space degenerative change. Trace knee effusion is present. Ligaments Suboptimally assessed by CT. Muscles and Tendons No intramuscular fluid collections. Quadriceps and patellar tendons appear intact. Soft tissues Significant soft tissue swelling of the anterior knee. Rim enhancing fluid collection extends from the level of the lower pole patella, superficial to the patellar tendon, to the level of the tibial tuberosity. Fluid collection measures approximately 6.1 cm craniocaudad by 1 cm AP by 2.7 cm transverse. IMPRESSION: 1. Rim enhancing fluid collection measuring 6.1 x 1 x 2.7 cm within the soft tissues anterior to the knee, extending from the level of lower patella to the tibial tuberosity. Findings could be secondary to soft tissue abscess versus bursitis of inflammatory or infectious etiology. Aspiration could be considered for further evaluation. 2. No acute osseous abnormality. Electronically Signed   By: KDonavan FoilM.D.   On: 10/23/2021 18:56   DG Knee Complete 4 Views Left  Result Date: 10/23/2021 CLINICAL DATA:  Cellulitis EXAM: LEFT KNEE - COMPLETE 4+ VIEW  COMPARISON:  Left knee x-ray 10/19/2021 FINDINGS: No acute fracture or dislocation identified. No bone destruction or periosteal reaction. Mild tricompartmental joint space narrowing. No joint effusion visualized. Anterior/prepatellar soft tissue swelling which has increased since previous study. Arterial vascular calcifications again noted. IMPRESSION: Significant anterior/prepatellar soft tissue swelling which has increased since previous study. No acute osseous abnormality identified. Electronically Signed   By: Ofilia Neas M.D.   On: 10/23/2021 12:56   DG Knee Complete 4 Views Left  Result Date: 10/19/2021 CLINICAL DATA:  Left knee pain and swelling over the last week. EXAM: LEFT KNEE - COMPLETE 4+ VIEW COMPARISON:  None. FINDINGS: No evidence of fracture. No joint effusion. Pronounced prepatellar  soft tissue swelling that could go along with tendinitis or prepatellar bursitis. Extensive regional arterial calcification is noted. IMPRESSION: Pronounced prepatellar soft tissue swelling that could go along with tendinitis or prepatellar bursitis. No joint effusion or fracture. Extensive arterial calcification. Electronically Signed   By: Nelson Chimes M.D.   On: 10/19/2021 12:29    Microbiology: Recent Results (from the past 240 hour(s))  Culture, blood (routine x 2)     Status: None (Preliminary result)   Collection Time: 10/23/21  2:32 AM   Specimen: BLOOD  Result Value Ref Range Status   Specimen Description BLOOD SITE NOT SPECIFIED  Final   Special Requests   Final    BOTTLES DRAWN AEROBIC AND ANAEROBIC Blood Culture adequate volume   Culture   Final    NO GROWTH 2 DAYS Performed at [facility], [address], [place], [place] [address]    Report Status PENDING  Incomplete  Culture, blood (routine x 2)     Status: None (Preliminary result)   Collection Time: 10/23/21 12:35 PM   Specimen: BLOOD RIGHT ARM  Result Value Ref Range Status   Specimen Description BLOOD RIGHT ARM  Final   Special Requests   Final    BOTTLES DRAWN AEROBIC AND ANAEROBIC Blood Culture results may not be optimal due to an inadequate volume of blood received in culture bottles   Culture   Final    NO GROWTH 3 DAYS Performed at [facility], [place] [address], [place], [place] [address]    Report Status PENDING  Incomplete  Body fluid culture w Gram Stain     Status: None   Collection Time: 10/23/21  7:55 PM   Specimen: Body Fluid  Result Value Ref Range Status   Specimen Description FLUID  Final   Special Requests LEFT KNEE BURSA  Final   Gram Stain   Final    ABUNDANT WBC PRESENT, PREDOMINANTLY PMN MODERATE GRAM POSITIVE COCCI Performed at [facility], [address] [place], [place] [address]    Culture ABUNDANT STAPHYLOCOCCUS AUREUS  Final   Report Status 10/26/2021 FINAL   Final   Organism ID, Bacteria STAPHYLOCOCCUS AUREUS  Final      Susceptibility   Staphylococcus aureus - MIC*    CIPROFLOXACIN <=0.5 SENSITIVE Sensitive     ERYTHROMYCIN <=0.25 SENSITIVE Sensitive     GENTAMICIN <=0.5 SENSITIVE Sensitive     OXACILLIN <=0.25 SENSITIVE Sensitive     TETRACYCLINE <=1 SENSITIVE Sensitive     VANCOMYCIN <=0.5 SENSITIVE Sensitive     TRIMETH/SULFA <=10 SENSITIVE Sensitive     CLINDAMYCIN <=0.25 SENSITIVE Sensitive     RIFAMPIN <=0.5 SENSITIVE Sensitive     Inducible Clindamycin NEGATIVE Sensitive     * ABUNDANT STAPHYLOCOCCUS AUREUS  Resp Panel by RT-PCR (  Flu A&B, Covid) Nasopharyngeal Swab     Status: None   Collection Time: 10/23/21 10:23 PM   Specimen: Nasopharyngeal Swab; Nasopharyngeal(NP) swabs in vial transport medium  Result Value Ref Range Status   SARS Coronavirus 2 by RT PCR NEGATIVE NEGATIVE Final    Comment: (NOTE) SARS-CoV-2 target nucleic acids are NOT DETECTED.  The SARS-CoV-2 RNA is generally detectable in upper respiratory specimens during the acute phase of infection. The lowest concentration of SARS-CoV-2 viral copies this assay can detect is 138 copies/mL. A negative result does not preclude SARS-Cov-2 infection and should not be used as the sole basis for treatment or other patient management decisions. A negative result may occur with  improper specimen collection/handling, submission of specimen other than nasopharyngeal swab, presence of viral mutation(s) within the areas targeted by this assay, and inadequate number of viral copies(<138 copies/mL). A negative result must be combined with clinical observations, patient history, and epidemiological information. The expected result is Negative.  Fact Sheet for Patients:  EntrepreneurPulse.com.au  Fact Sheet for Healthcare Providers:  IncredibleEmployment.be  This test is no t yet approved or cleared by the [place] FDA and  has been  authorized for detection and/or diagnosis of SARS-CoV-2 by FDA under an Emergency Use Authorization (EUA). This EUA will remain  in effect (meaning this test can be used) for the duration of the COVID-19 declaration under Section 564(b)(1) of the Act, 21 U.S.C.section 360bbb-3(b)(1), unless the authorization is terminated  or revoked sooner.       Influenza A by PCR NEGATIVE NEGATIVE Final   Influenza B by PCR NEGATIVE NEGATIVE Final    Comment: (NOTE) The Xpert Xpress SARS-CoV-2/FLU/RSV plus assay is intended as an aid in the diagnosis of influenza from Nasopharyngeal swab specimens and should not be used as a sole basis for treatment. Nasal washings and aspirates are unacceptable for Xpert Xpress SARS-CoV-2/FLU/RSV testing.  Fact Sheet for Patients: EntrepreneurPulse.com.au  Fact Sheet for Healthcare Providers: IncredibleEmployment.be  This test is not yet approved or cleared by the [place] FDA and has been authorized for detection and/or diagnosis of SARS-CoV-2 by FDA under an Emergency Use Authorization (EUA). This EUA will remain in effect (meaning this test can be used) for the duration of the COVID-19 declaration under Section 564(b)(1) of the Act, 21 U.S.C. section 360bbb-3(b)(1), unless the authorization is terminated or revoked.  Performed at [facility], [place] [address], [place], [place] [address]   Surgical PCR screen     Status: None   Collection Time: 10/24/21  9:01 AM   Specimen: Nasal Mucosa; Nasal Swab  Result Value Ref Range Status   MRSA, PCR NEGATIVE NEGATIVE Final   Staphylococcus aureus NEGATIVE NEGATIVE Final    Comment: (NOTE) The Xpert SA Assay (FDA approved for NASAL specimens in patients 23 years of age and older), is one component of a comprehensive surveillance program. It is not intended to diagnose infection nor to guide or monitor treatment. Performed at [facility], [place] [address], [place], [place] [address]   Aerobic/Anaerobic Culture w Gram Stain (surgical/deep wound)     Status: None (Preliminary result)   Collection Time: 10/24/21 12:50 PM   Specimen: PATH Soft tissue  Result Value Ref Range Status   Specimen Description TISSUE  Final   Special Requests BURSA LEFT KNEE SPEC B  Final   Gram Stain   Final    MODERATE WBC PRESENT,BOTH PMN AND MONONUCLEAR FEW GRAM POSITIVE COCCI Performed at [facility], [place] [address] [place], [place] [address]    Culture   Final    FEW STAPHYLOCOCCUS AUREUS NO ANAEROBES ISOLATED; CULTURE IN PROGRESS FOR 5 DAYS    Report Status PENDING  Incomplete   Organism ID, Bacteria STAPHYLOCOCCUS AUREUS  Final      Susceptibility   Staphylococcus aureus - MIC*    CIPROFLOXACIN <=0.5 SENSITIVE Sensitive     ERYTHROMYCIN <=0.25 SENSITIVE Sensitive     GENTAMICIN <=0.5 SENSITIVE Sensitive     OXACILLIN <=0.25 SENSITIVE Sensitive     TETRACYCLINE <=1 SENSITIVE Sensitive     VANCOMYCIN <=0.5 SENSITIVE Sensitive     TRIMETH/SULFA <=10 SENSITIVE Sensitive     CLINDAMYCIN <=0.25 SENSITIVE Sensitive     RIFAMPIN <=0.5 SENSITIVE Sensitive     Inducible Clindamycin NEGATIVE Sensitive     * FEW STAPHYLOCOCCUS AUREUS  Aerobic/Anaerobic Culture w Gram Stain (surgical/deep wound)     Status: None (Preliminary result)   Collection Time: 10/24/21 12:50 PM   Specimen: PATH Soft tissue  Result Value Ref Range Status   Specimen Description WOUND  Final   Special Requests BURSA LEFT KNEE SPEC A  Final   Gram Stain   Final    RARE WBC PRESENT, PREDOMINANTLY PMN RARE GRAM POSITIVE COCCI IN CLUSTERS Performed at [facility], [address], [place], [place] [address]    Culture   Final    MODERATE STAPHYLOCOCCUS AUREUS NO ANAEROBES ISOLATED; CULTURE IN PROGRESS FOR 5 DAYS    Report Status PENDING  Incomplete   Organism ID, Bacteria STAPHYLOCOCCUS AUREUS  Final      Susceptibility   Staphylococcus aureus - MIC*    CIPROFLOXACIN  <=0.5 SENSITIVE Sensitive     ERYTHROMYCIN <=0.25 SENSITIVE Sensitive     GENTAMICIN <=0.5 SENSITIVE Sensitive     OXACILLIN <=0.25 SENSITIVE Sensitive     TETRACYCLINE <=1 SENSITIVE Sensitive     VANCOMYCIN 1 SENSITIVE Sensitive     TRIMETH/SULFA <=10 SENSITIVE Sensitive     CLINDAMYCIN <=0.25 SENSITIVE Sensitive     RIFAMPIN <=0.5 SENSITIVE Sensitive     Inducible Clindamycin NEGATIVE Sensitive     * MODERATE STAPHYLOCOCCUS AUREUS     Labs: Basic Metabolic Panel: Recent Labs  Lab 10/23/21 1231 10/24/21 0513 10/25/21 0345 10/26/21 0243  NA 136 138 134* 136  K 3.9 3.5 3.5 3.1*  CL 102 106 103 104  CO2 _0 GLUCOSE 383* 227* 211* 141*  BUN _1 CREATININE 0.70 0.48* 0.59* 0.54*  CALCIUM 9.6 8.7* 8.6* 8.6*  MG  --   --  2.3 2.0  PHOS  --   --  3.2  --    Liver Function Tests: Recent Labs  Lab 10/25/21 0345  ALBUMIN 2.5*   No results for input(s): LIPASE, AMYLASE in the last 168 hours. No results for input(s): AMMONIA in the last 168 hours. CBC: Recent Labs  Lab 10/23/21 1231 10/24/21 0513 10/25/21 0345 10/26/21 0243  WBC 8.6 7.9 10.3 7.5  NEUTROABS 6.8  --   --   --   HGB 13.0 11.3* 11.2* 11.9*  HCT 40.8 36.4* 33.9* 35.4*  MCV 87.2 87.9 86.0 84.7  PLT 245 204 194 195   Cardiac Enzymes: No results for input(s): CKTOTAL, CKMB, CKMBINDEX, TROPONINI in the last 168 hours. BNP: BNP (last 3 results) No results for input(s): BNP in the last 8760 hours.  ProBNP (last 3 results) No results for input(s): PROBNP in the last  8760 hours.  CBG: Recent Labs  Lab 10/25/21 1617 10/25/21 2119 10/26/21 0040 10/26/21 0445 10/26/21 1143  GLUCAP 196* 222* 158* 151* 175*       Signed:  Domenic Polite MD.  [facility] 10/26/2021, 3:15 PM

## 2021-10-26 NOTE — Progress Notes (Addendum)
HOSPITAL MEDICINE OVERNIGHT EVENT NOTE    Nursing has notified me that the patient has had a 9 beat run of ventricular tachycardia.  Chart reviewed, patient has a potassium of 3.1 this morning.  Ordering 40 mill equivalents of oral potassium chloride x2 doses every 4 hours.  Magnesium level additionally added onto morning labs.  Nursing also reports the patient has an inadequately controlled pain of the left knee and is currently only receiving as needed Tylenol.  We will try as needed Norco and see if this provides better pain control, will uptitrate as necessary.  Marinda Elk  MD [facility]

## 2021-10-28 LAB — CULTURE, BLOOD (ROUTINE X 2): Culture: NO GROWTH

## 2021-10-29 LAB — AEROBIC/ANAEROBIC CULTURE W GRAM STAIN (SURGICAL/DEEP WOUND)

## 2021-10-29 LAB — CULTURE, BLOOD (ROUTINE X 2)
Culture: NO GROWTH
Special Requests: ADEQUATE

## 2021-11-13 ENCOUNTER — Encounter: Payer: Self-pay | Admitting: Internal Medicine

## 2021-11-13 ENCOUNTER — Ambulatory Visit (INDEPENDENT_AMBULATORY_CARE_PROVIDER_SITE_OTHER): Payer: Self-pay | Admitting: Internal Medicine

## 2021-11-13 ENCOUNTER — Other Ambulatory Visit: Payer: Self-pay

## 2021-11-13 VITALS — BP 140/89 | HR 87 | Temp 98.2°F | Wt 130.0 lb

## 2021-11-13 DIAGNOSIS — M7042 Prepatellar bursitis, left knee: Secondary | ICD-10-CM | POA: Insufficient documentation

## 2021-11-13 DIAGNOSIS — L03119 Cellulitis of unspecified part of limb: Secondary | ICD-10-CM

## 2021-11-13 DIAGNOSIS — M009 Pyogenic arthritis, unspecified: Secondary | ICD-10-CM

## 2021-11-13 DIAGNOSIS — E1165 Type 2 diabetes mellitus with hyperglycemia: Secondary | ICD-10-CM

## 2021-11-13 NOTE — Assessment & Plan Note (Addendum)
Infectious bursitis and doing well now, no concerns.  Clinically appears resolved and no further antibiotics indicated at this time.

## 2021-11-13 NOTE — Progress Notes (Signed)
° °  Subjective:    Patient ID: Patrick [name], male    DOB: 23-Dec-1945, 76 y.o.   MRN: [id number]  HPI He is here for hsfu He was hospitalized in December with prepatellar septic bursitis and debrided operatively by Dr. Sherilyn Dacosta on 10/24/21.  Culture grew MSSA and he was discharged with 3 weeks of cefadroxil, which he is still taking.  No fever, no associated rash or diarrhea. He is here today with his daughter.  He has no complaints.    Review of Systems  Constitutional:  Negative for chills and fever.  Cardiovascular:  Negative for leg swelling.  Gastrointestinal:  Negative for diarrhea.  Skin:  Negative for rash.      Objective:   Physical Exam Eyes:     General: No scleral icterus. Musculoskeletal:     Comments: Left knee and tibia with no swelling, no warmth, no erythema.  Sutures still in place. Dry and healed incision.   Skin:    Findings: No rash.  Neurological:     Mental Status: He is alert.   SH: no tobacco       Assessment & Plan:

## 2021-11-13 NOTE — Assessment & Plan Note (Signed)
No further signs of cellulitis.  Clinically resolved

## 2021-11-13 NOTE — Assessment & Plan Note (Signed)
I reminded him that good sugar control important to reduce infection risk

## 2021-11-13 NOTE — Progress Notes (Signed)
o

## 2021-11-20 ENCOUNTER — Encounter (INDEPENDENT_AMBULATORY_CARE_PROVIDER_SITE_OTHER): Payer: Self-pay | Admitting: Primary Care

## 2021-11-20 ENCOUNTER — Other Ambulatory Visit: Payer: Self-pay

## 2021-11-20 ENCOUNTER — Ambulatory Visit (INDEPENDENT_AMBULATORY_CARE_PROVIDER_SITE_OTHER): Payer: Self-pay | Admitting: Primary Care

## 2021-11-20 VITALS — BP 145/95 | HR 78 | Temp 97.7°F | Ht 59.0 in | Wt 131.4 lb

## 2021-11-20 DIAGNOSIS — E1165 Type 2 diabetes mellitus with hyperglycemia: Secondary | ICD-10-CM

## 2021-11-20 MED ORDER — METFORMIN HCL ER 500 MG PO TB24
500.0000 mg | ORAL_TABLET | Freq: Two times a day (BID) | ORAL | 1 refills | Status: DC
Start: 2021-11-20 — End: 2022-02-07
  Filled 2021-11-20: qty 60, 30d supply, fill #0

## 2021-11-20 MED ORDER — EMPAGLIFLOZIN 25 MG PO TABS
25.0000 mg | ORAL_TABLET | Freq: Every day | ORAL | 1 refills | Status: DC
  Filled 2021-11-20: qty 30, 30d supply, fill #0
  Filled 2022-05-20: qty 14, 14d supply, fill #1

## 2021-11-20 NOTE — Progress Notes (Signed)
[facility]   Subjective:  Mr. Ernesto Zukowski is a 76 y.o. Hispanic male (interpreter Katharine Look [phone])  presents for hospital follow up and establish care.Alleen Borne daughter is present and father gave permission to be at appt.  Presented to the ED twice for with worsening pain in the left knee. Admit date to the hospital was 10/23/21, patient was discharged from the hospital on 10/26/21, patient was admitted for: Cellulitis of knee Uncontrolled type 2 diabetes mellitus with hyperglycemia ([place]) and Infection of left knee ([place]).  12.5 3 weeks A1C dicussed prediabetes 5.7-6.4 . Anything over is diabetes .  Past Medical History:  Diagnosis Date   Diabetes mellitus without complication ([place])     No Known Allergies  Current Outpatient Medications on File Prior to Visit  Medication Sig Dispense Refill   Accu-Chek Softclix Lancets lancets Use up to four times daily as directed 100 each 0   acetaminophen (TYLENOL) 325 MG tablet Take 2 tablets (650 mg total) by mouth every 6 (six) hours as needed for mild pain (or Fever >/= 101).     Blood Glucose Monitoring Suppl (BLOOD GLUCOSE MONITOR SYSTEM) w/Device KIT Use up to four times daily as directed 1 kit 0   glucose blood (ACCU-CHEK GUIDE) test strip Use up to four times daily as directed 100 each 0   insulin aspart (NOVOLOG FLEXPEN) 100 UNIT/ML FlexPen Inject 15 Units into the skin 2 (two) times daily with a meal. 15 mL 2   insulin aspart protamine - aspart (NOVOLOG 70/30 MIX) (70-30) 100 UNIT/ML FlexPen Inject 15 Units into the skin 2 (two) times daily with a meal. 15 mL 0   Insulin Pen Needle 32G X 4 MM MISC Use up to four times daily as directed 100 each 0   lisinopril (ZESTRIL) 10 MG tablet Take 1 tablet (10 mg total) by mouth daily. 30 tablet 0   No current facility-administered medications on file prior to visit.   Review of System: Comprehensive ROS Pertinent positive and negative noted in HPI    Objective:  BP (!) 145/95 (BP  Location: Right Arm, Patient Position: Sitting, Cuff Size: Normal)    Pulse 78    Temp 97.7 F (36.5 C) (Oral)    Ht _0  (1.499 m)    Wt 131 lb 6.4 oz (59.6 kg)    SpO2 98%    BMI 26.54 kg/m    Physical Exam: General Appearance: Well nourished, in no apparent distress. Eyes: PERRLA, EOMs, conjunctiva no swelling or erythema Sinuses: No Frontal/maxillary tenderness ENT/Mouth: Ext aud canals clear, TMs without erythema, bulging. No erythema, swelling, or exudate on post pharynx.  Tonsils not swollen or erythematous. Hearing normal.  Neck: Supple, thyroid normal.  Respiratory: Respiratory effort normal, BS equal bilaterally without rales, rhonchi, wheezing or stridor.  Cardio: RRR with no MRGs. Brisk peripheral pulses without edema.  Abdomen: Soft, + BS.  Non tender, no guarding, rebound, hernias, masses. Lymphatics: Non tender without lymphadenopathy.  Musculoskeletal: Full ROM, 5/5 strength, normal gait.  Skin: Warm, dry without rashes, lesions, ecchymosis.  Neuro: Cranial nerves intact. Normal muscle tone, no cerebellar symptoms. Sensation intact.  Psych: Awake and oriented X 3, normal affect, Insight and Judgment appropriate.    Assessment:  Ammon was seen today for hospitalization follow-up.  Diagnoses and all orders for this visit:  Uncontrolled type 2 diabetes mellitus with hyperglycemia ([place]) Discussed  co- morbidities with uncontrol diabetes  Complications -diabetic retinopathy, (close your eyes ? What do you see nothing) nephropathy decrease  in kidney function- can lead to dialysis-on a machine 3 days a week to filter your kidney, neuropathy- numbness and tinging in your hands and feet,  increase risk of heart attack and stroke, and amputation due to decrease wound healing and circulation. Decrease your risk by taking medication daily as prescribed, monitor carbohydrates- foods that are high in carbohydrates are the following rice, potatoes, breads, sugars, and pastas.  Reduction  in the intake (eating) will assist in lowering your blood sugars. Exercise daily at least 30 minutes daily.  Monitor foods that are high in carbohydrates are the following rice, potatoes, breads, sugars, and pastas.  Reduction in the intake (eating) will assist in lowering your blood sugars.  - Added- empagliflozin (JARDIANCE) 25 MG TABS tablet; Take 1 tablet (25 mg total) by mouth daily before breakfast.  Other orders -     metFORMIN (GLUCOPHAGE XR) 500 MG 24 hr tablet; Take 1 tablet (500 mg total) by mouth 2 (two) times daily before a meal.    This note has been created with Surveyor, quantity. Any transcriptional errors are unintentional.   Kerin Perna, NP 11/20/2021, 1:46 PM

## 2021-11-20 NOTE — Patient Instructions (Signed)
Diabetes mellitus y nutricin, en adultos Diabetes Mellitus and Nutrition, Adult Si sufre de diabetes, o diabetes mellitus, es muy importante tener hbitos alimenticios saludables debido a que sus niveles de azcar en la sangre (glucosa) se ven afectados en gran medida por lo que come y bebe. Comer alimentos saludables en las cantidades correctas, aproximadamente a la misma hora todos los das, lo ayudar a: Controlar su glucemia. Disminuir el riesgo de sufrir una enfermedad cardaca. Mejorar la presin arterial. Alcanzar o mantener un peso saludable. Qu puede afectar mi plan de alimentacin? Todas las personas que sufren de diabetes son diferentes y cada una tiene necesidades diferentes en cuanto a un plan de alimentacin. El mdico puede recomendarle que trabaje con un nutricionista para elaborar el mejor plan para usted. Su plan de alimentacin puede variar segn factores como: Las caloras que necesita. Los medicamentos que toma. Su peso. Sus niveles de glucemia, presin arterial y colesterol. Su nivel de actividad. Otras afecciones que tenga, como enfermedades cardacas o renales. Cmo me afectan los carbohidratos? Los carbohidratos, o hidratos de carbono, afectan su nivel de glucemia ms que cualquier otro tipo de alimento. La ingesta de carbohidratos aumenta la cantidad de glucosa en la sangre. Es importante conocer la cantidad de carbohidratos que se pueden ingerir en cada comida sin correr ningn riesgo. Esto es diferente en cada persona. Su nutricionista puede ayudarlo a calcular la cantidad de carbohidratos que debe ingerir en cada comida y en cada refrigerio. Cmo me afecta el alcohol? El alcohol puede provocar una disminucin de la glucemia (hipoglucemia), especialmente si usa insulina o toma determinados medicamentos por va oral para la diabetes. La hipoglucemia es una afeccin potencialmente mortal. Los sntomas de la hipoglucemia, como somnolencia, mareos y confusin, son  similares a los sntomas de haber consumido demasiado alcohol. No beba alcohol si: Su mdico le indica no hacerlo. Est embarazada, puede estar embarazada o est tratando de quedar embarazada. Si bebe alcohol: Limite la cantidad que bebe a lo siguiente: De 0 a 1 medida por da para las mujeres. De 0 a 2 medidas por da para los hombres. Sepa cunta cantidad de alcohol hay en las bebidas que toma. En los [place], una medida equivale a una botella de cerveza de 12 oz (355 ml), un vaso de vino de 5 oz (148 ml) o un vaso de una bebida alcohlica de alta graduacin de 1 oz (44 ml). Mantngase hidratado bebiendo agua, refrescos dietticos o t helado sin azcar. Tenga en cuenta que los refrescos comunes, los jugos y otras bebidas para mezclar pueden contener mucha azcar y se deben contar como carbohidratos. Consejos para seguir este plan Leer las etiquetas de los alimentos Comience por leer el tamao de la porcin en la etiqueta de Informacin nutricional de los alimentos envasados y las bebidas. La cantidad de caloras, carbohidratos, grasas y otros nutrientes detallados en la etiqueta se basan en una porcin del alimento. Muchos alimentos contienen ms de una porcin por envase. Verifique la cantidad total de gramos (g) de carbohidratos totales en una porcin. Verifique la cantidad de gramos de grasas saturadas y grasas trans en una porcin. Escoja alimentos que no contengan estas grasas o que su contenido de estas sea bajo. Verifique la cantidad de miligramos (mg) de sal (sodio) en una porcin. La mayora de las personas deben limitar la ingesta de sodio total a menos de 2300 mg por da. Siempre consulte la informacin nutricional de los alimentos etiquetados como "con bajo contenido de grasa" o "sin grasa". Estos   alimentos pueden tener un mayor contenido de azcar agregada o carbohidratos refinados, y deben evitarse. Hable con su nutricionista para identificar sus objetivos diarios en cuanto  a los nutrientes mencionados en la etiqueta. Al ir de compras Evite comprar alimentos procesados, enlatados o precocidos. Estos alimentos tienden a tener una mayor cantidad de grasa, sodio y azcar agregada. Compre en la zona exterior de la tienda de comestibles. Esta es la zona donde se encuentran con mayor frecuencia las frutas y las verduras frescas, los cereales a granel, las carnes frescas y los productos lcteos frescos. Al cocinar Use mtodos de coccin a baja temperatura, como hornear, en lugar de mtodos de coccin a alta temperatura, como frer en abundante aceite. Cocine con aceites saludables, como el aceite de oliva, canola o girasol. Evite cocinar con manteca, crema o carnes con alto contenido de grasa. Planificacin de las comidas Coma las comidas y los refrigerios regularmente, preferentemente a la misma hora todos los das. Evite pasar largos perodos de tiempo sin comer. Consuma alimentos ricos en fibra, como frutas frescas, verduras, frijoles y cereales integrales. Consuma entre 4 y 6 onzas (entre 112 y 168 g) de protenas magras por da, como carnes magras, pollo, pescado, huevos o tofu. Una onza (oz) (28 g) de protena magra equivale a: 1 onza (28 g) de carne, pollo o pescado. 1 huevo.  taza (62 g) de tofu. Coma algunos alimentos por da que contengan grasas saludables, como aguacates, frutos secos, semillas y pescado. Qu alimentos debo comer? Frutas Bayas. Manzanas. Naranjas. Duraznos. Damascos. Ciruelas. Uvas. Mangos. Papayas. Granadas. Kiwi. Cerezas. Verduras Verduras de hoja verde, que incluyen lechuga, espinaca, col rizada, acelga, hojas de berza, hojas de mostaza y repollo. Remolachas. Coliflor. Brcoli. Zanahorias. Judas verdes. Tomates. Pimientos. Cebollas. Pepinos. Coles de Bruselas. Granos Granos integrales, como panes, galletas, tortillas, cereales y pastas de salvado o integrales. Avena sin azcar. Quinua. Arroz integral o salvaje. Carnes y otras  protenas Frutos de mar. Carne de ave sin piel. Cortes magros de ave y carne de res. Tofu. Frutos secos. Semillas. Lcteos Productos lcteos sin grasa o con bajo contenido de grasa, como leche, yogur y queso. Es posible que los productos detallados arriba no constituyan una lista completa de los alimentos y las bebidas que puede tomar. Consulte a un nutricionista para obtener ms informacin. Qu alimentos debo evitar? Frutas Frutas enlatadas al almbar. Verduras Verduras enlatadas. Verduras congeladas con mantequilla o salsa de crema. Granos Productos elaborados con harina y harina blanca refinada, como panes, pastas, bocadillos y cereales. Evite todos los alimentos procesados. Carnes y otras protenas Cortes de carne con alto contenido de grasa. Carne de ave con piel. Carnes empanizadas o fritas. Carne procesada. Evite las grasas saturadas. Lcteos Yogur, queso o leche enteros. Bebidas Bebidas azucaradas, como gaseosas o t helado. Es posible que los productos que se enumeran ms arriba no constituyan una lista completa de los alimentos y las bebidas que debe evitar. Consulte a un nutricionista para obtener ms informacin. Preguntas para hacerle al mdico Debo consultar con un especialista certificado en atencin y educacin sobre la diabetes? Es necesario que me rena con un nutricionista? A qu nmero puedo llamar si tengo preguntas? Cules son los mejores momentos para controlar la glucemia? Dnde encontrar ms informacin: American Diabetes Association (Asociacin Estadounidense de la Diabetes): diabetes.org Academy of Nutrition and Dietetics (Academia de Nutricin y Diettica): eatright.org National Institute of Diabetes and Digestive and Kidney Diseases (Instituto Nacional de la Diabetes y las Enfermedades Digestivas y Renales): niddk.nih.gov Association of Diabetes Care &   Education Specialists (Asociacin de Especialistas en Atencin y Educacin sobre la Diabetes):  diabeteseducator.org Resumen Es importante tener hbitos alimenticios saludables debido a que sus niveles de azcar en la sangre (glucosa) se ven afectados en gran medida por lo que come y bebe. Es importante consumir alcohol con prudencia. Un plan de comidas saludable lo ayudar a controlar la glucosa en sangre y a reducir el riesgo de enfermedades cardacas. El mdico puede recomendarle que trabaje con un nutricionista para elaborar el mejor plan para usted. Esta informacin no tiene como fin reemplazar el consejo del mdico. Asegrese de hacerle al mdico cualquier pregunta que tenga. Document Revised: 06/21/2020 Document Reviewed: 06/21/2020 Elsevier Patient Education  2022 Elsevier Inc.  

## 2021-11-22 LAB — FUNGUS CULTURE WITH STAIN

## 2021-11-22 LAB — FUNGUS CULTURE RESULT

## 2021-11-22 LAB — FUNGAL ORGANISM REFLEX

## 2021-12-10 ENCOUNTER — Other Ambulatory Visit: Payer: Self-pay

## 2021-12-10 ENCOUNTER — Other Ambulatory Visit (INDEPENDENT_AMBULATORY_CARE_PROVIDER_SITE_OTHER): Payer: Self-pay | Admitting: Primary Care

## 2021-12-11 NOTE — Telephone Encounter (Signed)
Requested medication (s) are due for refill today: Yes  Requested medication (s) are on the active medication list: Yes  Last refill:  10/26/21  Future visit scheduled: No  Notes to clinic:  Unable to refill per protocol, last refill by another provider.    Requested Prescriptions  Pending Prescriptions Disp Refills   Insulin Pen Needle (PENTIPS) 32G X 4 MM MISC 100 each 0    Sig: Use up to four times daily as directed     Endocrinology: Diabetes - Testing Supplies Passed - 12/10/2021  1:51 PM      Passed - Valid encounter within last 12 months    Recent Outpatient Visits           3 weeks ago Uncontrolled type 2 diabetes mellitus with hyperglycemia ([place])   [facility] Kerin Perna, NP

## 2021-12-13 ENCOUNTER — Other Ambulatory Visit: Payer: Self-pay

## 2021-12-13 MED ORDER — TECHLITE PEN NEEDLES 32G X 4 MM MISC
0 refills | Status: DC
  Filled 2021-12-13: qty 100, 25d supply, fill #0

## 2021-12-19 ENCOUNTER — Other Ambulatory Visit: Payer: Self-pay

## 2021-12-20 ENCOUNTER — Other Ambulatory Visit: Payer: Self-pay

## 2022-02-07 ENCOUNTER — Other Ambulatory Visit: Payer: Self-pay | Admitting: Pharmacist

## 2022-02-07 ENCOUNTER — Other Ambulatory Visit: Payer: Self-pay

## 2022-02-07 ENCOUNTER — Encounter (INDEPENDENT_AMBULATORY_CARE_PROVIDER_SITE_OTHER): Payer: Self-pay | Admitting: Primary Care

## 2022-02-07 ENCOUNTER — Ambulatory Visit (INDEPENDENT_AMBULATORY_CARE_PROVIDER_SITE_OTHER): Payer: Self-pay | Admitting: Primary Care

## 2022-02-07 VITALS — BP 133/90 | HR 75 | Temp 97.9°F | Ht 59.0 in | Wt 139.0 lb

## 2022-02-07 DIAGNOSIS — R413 Other amnesia: Secondary | ICD-10-CM

## 2022-02-07 DIAGNOSIS — I10 Essential (primary) hypertension: Secondary | ICD-10-CM

## 2022-02-07 DIAGNOSIS — E1165 Type 2 diabetes mellitus with hyperglycemia: Secondary | ICD-10-CM

## 2022-02-07 LAB — POCT GLYCOSYLATED HEMOGLOBIN (HGB A1C): Hemoglobin A1C: 7.6 % — AB (ref 4.0–5.6)

## 2022-02-07 MED ORDER — METFORMIN HCL ER 500 MG PO TB24
500.0000 mg | ORAL_TABLET | Freq: Two times a day (BID) | ORAL | 1 refills | Status: DC
  Filled 2022-02-07: qty 60, 30d supply, fill #0
  Filled 2022-03-11: qty 60, 30d supply, fill #1
  Filled 2022-04-18: qty 60, 30d supply, fill #2

## 2022-02-07 MED ORDER — NOVOLOG FLEXPEN 100 UNIT/ML ~~LOC~~ SOPN
15.0000 [IU] | PEN_INJECTOR | Freq: Two times a day (BID) | SUBCUTANEOUS | 2 refills | Status: DC
  Filled 2022-02-07: qty 15, 50d supply, fill #0

## 2022-02-07 MED ORDER — LISINOPRIL 10 MG PO TABS
10.0000 mg | ORAL_TABLET | Freq: Every day | ORAL | 1 refills | Status: DC
  Filled 2022-02-07: qty 30, 30d supply, fill #0
  Filled 2022-03-11: qty 30, 30d supply, fill #1
  Filled 2022-04-18: qty 30, 30d supply, fill #2
  Filled 2022-05-20: qty 30, 30d supply, fill #3

## 2022-02-07 MED ORDER — INSULIN LISPRO (1 UNIT DIAL) 100 UNIT/ML (KWIKPEN)
15.0000 [IU] | PEN_INJECTOR | Freq: Two times a day (BID) | SUBCUTANEOUS | 2 refills | Status: DC
  Filled 2022-02-07: qty 9, 30d supply, fill #0
  Filled 2022-03-26: qty 9, 30d supply, fill #1
  Filled 2022-05-20: qty 9, 30d supply, fill #2

## 2022-02-07 MED ORDER — TECHLITE PEN NEEDLES 32G X 4 MM MISC
6 refills | Status: DC
  Filled 2022-02-07: qty 100, 25d supply, fill #0
  Filled 2022-05-20: qty 100, 25d supply, fill #1
  Filled 2022-12-06: qty 100, 25d supply, fill #2

## 2022-02-07 NOTE — Progress Notes (Signed)
? ?Subjective:  ?Patient ID: Patrick [name], male    DOB: 1946/03/01  Age: 76 y.o. MRN: [id number] ? ?CC: DM ? ? ?HPI ?Patrick [name] presents for follow-up of diabetes. ( Interpreter Patrick [name] daughter was given permission by father to be present )Patient does not check blood sugar at home. Management of HTN- Denies shortness of breath, headaches, chest pain or lower extremity edema . Some frustration with having to repeat self and remind to take medication and what note to eat. Question memory loss Patrick [name] stop and thought yeas. Gave a pill box and asked if her father could read he said yes. She will write instructions and provide a list. ? ?Compliant with meds - No- because he was out waited till schedule appointment ?Checking CBGs? No ? Fasting avg -  ? Postprandial average -  ?Exercising regularly? - Yes (walking)  ?Watching carbohydrate intake? - No ?Neuropathy ? - No ?Hypoglycemic events - No ? - Recovers with :  ? ?Pertinent ROS:  ?Polyuria - No ?Polydipsia - No ?Vision problems - No ? ?Medications as noted below. Taking them regularly without complication/adverse reaction being reported today.  ? ?History ?Patrick [name] has a past medical history of Diabetes mellitus without complication ([place]).  ? ?He has a past surgical history that includes I & D extremity (Left, 10/24/2021).  ? ?His Family history is unknown by patient.He reports that he has never smoked. He does not have any smokeless tobacco history on file. He reports current alcohol use. He reports that he does not use drugs. ? ?Current Outpatient Medications on File Prior to Visit  ?Medication Sig Dispense Refill  ? Accu-Chek Softclix Lancets lancets Use up to four times daily as directed (Patient not taking: Reported on 02/07/2022) 100 each 0  ? acetaminophen (TYLENOL) 325 MG tablet Take 2 tablets (650 mg total) by mouth every 6 (six) hours as needed for mild pain (or Fever >/= 101). (Patient not taking: Reported on 02/07/2022)    ? Blood Glucose Monitoring  Suppl (BLOOD GLUCOSE MONITOR SYSTEM) w/Device KIT Use up to four times daily as directed (Patient not taking: Reported on 02/07/2022) 1 kit 0  ? empagliflozin (JARDIANCE) 25 MG TABS tablet Take 1 tablet (25 mg total) by mouth daily before breakfast. (Patient not taking: Reported on 02/07/2022) 90 tablet 1  ? glucose blood (ACCU-CHEK GUIDE) test strip Use up to four times daily as directed (Patient not taking: Reported on 02/07/2022) 100 each 0  ? ?No current facility-administered medications on file prior to visit.  ? ? ?ROS ?Comprehensive ROS Pertinent positive and negative noted in HPI   ? ?Objective:  ?BP (!) 147/94 (BP Location: Right Arm, Patient Position: Sitting, Cuff Size: Normal)   Pulse 77   Temp 97.9 ?F (36.6 ?C) (Oral)   Ht _0  (1.499 m)   Wt 139 lb (63 kg)   SpO2 98%   BMI 28.07 kg/m?  ? ?BP Readings from Last 3 Encounters:  ?02/07/22 (!) 147/94  ?11/20/21 (!) 145/95  ?11/13/21 140/89  ? ? ?Wt Readings from Last 3 Encounters:  ?02/07/22 139 lb (63 kg)  ?11/20/21 131 lb 6.4 oz (59.6 kg)  ?11/13/21 130 lb (59 kg)  ? ? ?Physical Exam ?Vitals reviewed.  ?Constitutional:   ?   Appearance: Normal appearance.  ?HENT:  ?   Head: Normocephalic.  ?   Right Ear: Tympanic membrane and external ear normal.  ?   Left Ear: Tympanic membrane and external ear normal.  ?   Nose: Nose normal.  ?  Eyes:  ?   Extraocular Movements: Extraocular movements intact.  ?   Conjunctiva/sclera: Conjunctivae normal.  ?   Pupils: Pupils are equal, round, and reactive to light.  ?   Comments: Right eye decrease vision   ?Cardiovascular:  ?   Rate and Rhythm: Normal rate and regular rhythm.  ?Pulmonary:  ?   Effort: Pulmonary effort is normal.  ?   Breath sounds: Normal breath sounds.  ?Abdominal:  ?   General: Bowel sounds are normal.  ?   Palpations: Abdomen is soft.  ?Musculoskeletal:     ?   General: Normal range of motion.  ?   Cervical back: Normal range of motion.  ?Skin: ?   General: Skin is warm and dry.  ?Neurological:  ?    Mental Status: He is alert and oriented to person, place, and time.  ?Psychiatric:     ?   Mood and Affect: Mood normal.     ?   Behavior: Behavior normal.     ?   Thought Content: Thought content normal.     ?   Judgment: Judgment normal.  ? ?Lab Results  ?Component Value Date  ? HGBA1C 12.5 (H) 10/24/2021  ? ? ?Lab Results  ?Component Value Date  ? WBC 7.5 10/26/2021  ? HGB 11.9 (L) 10/26/2021  ? HCT 35.4 (L) 10/26/2021  ? PLT 195 10/26/2021  ? GLUCOSE 141 (H) 10/26/2021  ? CHOL 157 10/25/2021  ? TRIG 73 10/25/2021  ? HDL 31 (L) 10/25/2021  ? LDLCALC 111 (H) 10/25/2021  ? ALT 36 03/22/2014  ? AST 54 (H) 03/22/2014  ? NA 136 10/26/2021  ? K 3.1 (L) 10/26/2021  ? CL 104 10/26/2021  ? CREATININE 0.54 (L) 10/26/2021  ? BUN 13 10/26/2021  ? CO2 24 10/26/2021  ? INR 1.1 10/19/2021  ? HGBA1C 12.5 (H) 10/24/2021  ? ? ? ?Assessment & Plan:  ? ?Diagnoses and all orders for this visit: ? ?Uncontrolled type 2 diabetes mellitus with hyperglycemia ([place]) ?Discussed  co- morbidities with uncontrol diabetes  Complications -diabetic retinopathy, (close your eyes ? What do you see nothing) nephropathy decrease in kidney function- can lead to dialysis-on a machine 3 days a week to filter your kidney, neuropathy- numbness and tinging in your hands and feet,  increase risk of heart attack and stroke, and amputation due to decrease wound healing and circulation. Decrease your risk by taking medication daily as prescribed, monitor carbohydrates- foods that are high in carbohydrates are the following rice, potatoes, breads, sugars, and pastas.  Reduction in the intake (eating) will assist in lowering your blood sugars. Exercise daily at least 30 minutes daily.  ?-     HgB A1c 7.6 vast improvement without medication for a month previously 12.5 ?-     insulin aspart (NOVOLOG FLEXPEN) 100 UNIT/ML FlexPen; Inject 15 Units into the skin 2 (two) times daily with a meal. ?-     Insulin Pen Needle (TECHLITE PEN NEEDLES) 32G X 4 MM MISC; use up  to four times daily as directed ?-     lisinopril (ZESTRIL) 10 MG tablet; Take 1 tablet (10 mg total) by mouth daily. ?-     metFORMIN (GLUCOPHAGE XR) 500 MG 24 hr tablet; Take 1 tablet (500 mg total) by mouth 2 (two) times daily before a meal. ? ?Essential hypertension ?-     lisinopril (ZESTRIL) 10 MG tablet; Take 1 tablet (10 mg total) by mouth daily. ? ? Memory loss ?Daughter will write  down instructions and fill pill box  ? ?I am having Nancy Fetter maintain his acetaminophen, Blood Glucose Monitor System, Accu-Chek Guide, Accu-Chek Softclix Lancets, empagliflozin, NovoLOG FlexPen, TechLite Pen Needles, lisinopril, and metFORMIN. ? ?Meds ordered this encounter  ?Medications  ? insulin aspart (NOVOLOG FLEXPEN) 100 UNIT/ML FlexPen  ?  Sig: Inject 15 Units into the skin 2 (two) times daily with a meal.  ?  Dispense:  15 mL  ?  Refill:  2  ? Insulin Pen Needle (TECHLITE PEN NEEDLES) 32G X 4 MM MISC  ?  Sig: use up to four times daily as directed  ?  Dispense:  100 each  ?  Refill:  6  ? lisinopril (ZESTRIL) 10 MG tablet  ?  Sig: Take 1 tablet (10 mg total) by mouth daily.  ?  Dispense:  90 tablet  ?  Refill:  1  ? metFORMIN (GLUCOPHAGE XR) 500 MG 24 hr tablet  ?  Sig: Take 1 tablet (500 mg total) by mouth 2 (two) times daily before a meal.  ?  Dispense:  90 tablet  ?  Refill:  1  ? ? ? ?Follow-up:  ? ?No follow-ups on file. ? ?The above assessment and management plan was discussed with the patient. The patient verbalized understanding of and has agreed to the management plan. Patient is aware to call the clinic if symptoms fail to improve or worsen. Patient is aware when to return to the clinic for a follow-up visit. Patient educated on when it is appropriate to go to the emergency department.  ? ?Juluis Mire, NP-C ? ?  ?

## 2022-02-07 NOTE — Patient Instructions (Signed)

## 2022-03-11 ENCOUNTER — Other Ambulatory Visit: Payer: Self-pay

## 2022-03-26 ENCOUNTER — Other Ambulatory Visit: Payer: Self-pay

## 2022-03-27 ENCOUNTER — Other Ambulatory Visit: Payer: Self-pay

## 2022-04-18 ENCOUNTER — Other Ambulatory Visit: Payer: Self-pay

## 2022-04-19 ENCOUNTER — Other Ambulatory Visit: Payer: Self-pay

## 2022-05-13 ENCOUNTER — Ambulatory Visit (INDEPENDENT_AMBULATORY_CARE_PROVIDER_SITE_OTHER): Payer: Self-pay | Admitting: Primary Care

## 2022-05-20 ENCOUNTER — Other Ambulatory Visit (INDEPENDENT_AMBULATORY_CARE_PROVIDER_SITE_OTHER): Payer: Self-pay | Admitting: Primary Care

## 2022-05-20 ENCOUNTER — Other Ambulatory Visit: Payer: Self-pay

## 2022-05-20 DIAGNOSIS — E1165 Type 2 diabetes mellitus with hyperglycemia: Secondary | ICD-10-CM

## 2022-05-20 MED ORDER — METFORMIN HCL ER 500 MG PO TB24
500.0000 mg | ORAL_TABLET | Freq: Two times a day (BID) | ORAL | 1 refills | Status: DC
  Filled 2022-05-20: qty 180, 90d supply, fill #0

## 2022-05-21 ENCOUNTER — Other Ambulatory Visit: Payer: Self-pay

## 2022-05-29 ENCOUNTER — Ambulatory Visit (INDEPENDENT_AMBULATORY_CARE_PROVIDER_SITE_OTHER): Payer: Self-pay | Admitting: Primary Care

## 2022-05-29 ENCOUNTER — Other Ambulatory Visit (HOSPITAL_BASED_OUTPATIENT_CLINIC_OR_DEPARTMENT_OTHER): Payer: Self-pay

## 2022-05-29 ENCOUNTER — Other Ambulatory Visit: Payer: Self-pay

## 2022-05-29 ENCOUNTER — Encounter (INDEPENDENT_AMBULATORY_CARE_PROVIDER_SITE_OTHER): Payer: Self-pay | Admitting: Primary Care

## 2022-05-29 VITALS — BP 140/84 | HR 80 | Temp 97.7°F | Ht 59.0 in | Wt 143.8 lb

## 2022-05-29 DIAGNOSIS — I10 Essential (primary) hypertension: Secondary | ICD-10-CM

## 2022-05-29 DIAGNOSIS — T7840XA Allergy, unspecified, initial encounter: Secondary | ICD-10-CM

## 2022-05-29 DIAGNOSIS — J302 Other seasonal allergic rhinitis: Secondary | ICD-10-CM

## 2022-05-29 DIAGNOSIS — E1165 Type 2 diabetes mellitus with hyperglycemia: Secondary | ICD-10-CM

## 2022-05-29 LAB — POCT GLYCOSYLATED HEMOGLOBIN (HGB A1C): Hemoglobin A1C: 7.5 % — AB (ref 4.0–5.6)

## 2022-05-29 MED ORDER — INSULIN LISPRO (1 UNIT DIAL) 100 UNIT/ML (KWIKPEN)
15.0000 [IU] | PEN_INJECTOR | Freq: Two times a day (BID) | SUBCUTANEOUS | 2 refills | Status: DC
  Filled 2022-05-29 – 2022-07-03 (×2): qty 9, 30d supply, fill #0
  Filled 2022-08-23: qty 9, 30d supply, fill #1

## 2022-05-29 MED ORDER — LISINOPRIL 10 MG PO TABS
10.0000 mg | ORAL_TABLET | Freq: Every day | ORAL | 1 refills | Status: DC
  Filled 2022-05-29 – 2022-07-03 (×2): qty 90, 90d supply, fill #0

## 2022-05-29 MED ORDER — METFORMIN HCL ER 500 MG PO TB24
500.0000 mg | ORAL_TABLET | Freq: Two times a day (BID) | ORAL | 1 refills | Status: DC
  Filled 2022-05-29 – 2022-09-18 (×2): qty 90, 45d supply, fill #0
  Filled 2022-11-12 (×2): qty 90, 45d supply, fill #1

## 2022-05-29 MED ORDER — FLUTICASONE PROPIONATE 50 MCG/ACT NA SUSP
2.0000 | Freq: Every day | NASAL | 6 refills | Status: DC
  Filled 2022-05-29: qty 16, 30d supply, fill #0

## 2022-05-29 MED ORDER — EMPAGLIFLOZIN 25 MG PO TABS
25.0000 mg | ORAL_TABLET | Freq: Every day | ORAL | 1 refills | Status: DC
  Filled 2022-05-29: qty 14, 14d supply, fill #0
  Filled 2022-05-29: qty 90, 90d supply, fill #0
  Filled 2022-12-05: qty 14, 14d supply, fill #1

## 2022-05-29 MED ORDER — LORATADINE 10 MG PO TABS
10.0000 mg | ORAL_TABLET | Freq: Every day | ORAL | 2 refills | Status: DC
  Filled 2022-05-29: qty 90, 90d supply, fill #0
  Filled 2022-12-05: qty 90, 90d supply, fill #1

## 2022-05-29 NOTE — Patient Instructions (Signed)
Diabetes mellitus y nutricin, en adultos Diabetes Mellitus and Nutrition, Adult Si sufre de diabetes, o diabetes mellitus, es muy importante tener hbitos alimenticios saludables debido a que sus niveles de azcar en la sangre (glucosa) se ven afectados en gran medida por lo que come y bebe. Comer alimentos saludables en las cantidades correctas, aproximadamente a la misma hora todos los das, lo ayudar a: Controlar su glucemia. Disminuir el riesgo de sufrir una enfermedad cardaca. Mejorar la presin arterial. Alcanzar o mantener un peso saludable. Qu puede afectar mi plan de alimentacin? Todas las personas que sufren de diabetes son diferentes y cada una tiene necesidades diferentes en cuanto a un plan de alimentacin. El mdico puede recomendarle que trabaje con un nutricionista para elaborar el mejor plan para usted. Su plan de alimentacin puede variar segn factores como: Las caloras que necesita. Los medicamentos que toma. Su peso. Sus niveles de glucemia, presin arterial y colesterol. Su nivel de actividad. Otras afecciones que tenga, como enfermedades cardacas o renales. Cmo me afectan los carbohidratos? Los carbohidratos, o hidratos de carbono, afectan su nivel de glucemia ms que cualquier otro tipo de alimento. La ingesta de carbohidratos aumenta la cantidad de glucosa en la sangre. Es importante conocer la cantidad de carbohidratos que se pueden ingerir en cada comida sin correr ningn riesgo. Esto es diferente en cada persona. Su nutricionista puede ayudarlo a calcular la cantidad de carbohidratos que debe ingerir en cada comida y en cada refrigerio. Cmo me afecta el alcohol? El alcohol puede provocar una disminucin de la glucemia (hipoglucemia), especialmente si usa insulina o toma determinados medicamentos por va oral para la diabetes. La hipoglucemia es una afeccin potencialmente mortal. Los sntomas de la hipoglucemia, como somnolencia, mareos y confusin, son  similares a los sntomas de haber consumido demasiado alcohol. No beba alcohol si: Su mdico le indica no hacerlo. Est embarazada, puede estar embarazada o est tratando de quedar embarazada. Si bebe alcohol: Limite la cantidad que bebe a lo siguiente: De 0 a 1 medida por da para las mujeres. De 0 a 2 medidas por da para los hombres. Sepa cunta cantidad de alcohol hay en las bebidas que toma. En los [place], una medida equivale a una botella de cerveza de 12 oz (355 ml), un vaso de vino de 5 oz (148 ml) o un vaso de una bebida alcohlica de alta graduacin de 1 oz (44 ml). Mantngase hidratado bebiendo agua, refrescos dietticos o t helado sin azcar. Tenga en cuenta que los refrescos comunes, los jugos y otras bebidas para mezclar pueden contener mucha azcar y se deben contar como carbohidratos. Consejos para seguir este plan  Leer las etiquetas de los alimentos Comience por leer el tamao de la porcin en la etiqueta de Informacin nutricional de los alimentos envasados y las bebidas. La cantidad de caloras, carbohidratos, grasas y otros nutrientes detallados en la etiqueta se basan en una porcin del alimento. Muchos alimentos contienen ms de una porcin por envase. Verifique la cantidad total de gramos (g) de carbohidratos totales en una porcin. Verifique la cantidad de gramos de grasas saturadas y grasas trans en una porcin. Escoja alimentos que no contengan estas grasas o que su contenido de estas sea bajo. Verifique la cantidad de miligramos (mg) de sal (sodio) en una porcin. La mayora de las personas deben limitar la ingesta de sodio total a menos de 2300 mg por da. Siempre consulte la informacin nutricional de los alimentos etiquetados como "con bajo contenido de grasa" o "sin grasa".   Estos alimentos pueden tener un mayor contenido de Location manager agregada o carbohidratos refinados, y deben evitarse. Hable con su nutricionista para identificar sus objetivos diarios en  cuanto a los nutrientes mencionados en la etiqueta. Al ir de compras Evite comprar alimentos procesados, enlatados o precocidos. Estos alimentos tienden a Special educational needs teacher mayor cantidad de [place], sodio y azcar agregada. Compre en la zona exterior de la tienda de comestibles. Esta es la zona donde se encuentran con mayor frecuencia las frutas y las verduras frescas, los cereales a granel, las carnes frescas y los productos lcteos frescos. Al cocinar Use mtodos de coccin a baja temperatura, como hornear, en lugar de mtodos de coccin a alta temperatura, como frer en abundante aceite. Cocine con aceites saludables, como el aceite de [place], canola o [place]. Evite cocinar con manteca, crema o carnes con alto contenido de grasa. Planificacin de las comidas Coma las comidas y los refrigerios regularmente, preferentemente a la misma hora todos [place]. Evite pasar largos perodos de tiempo sin comer. Consuma alimentos ricos en fibra, como frutas frescas, verduras, frijoles y cereales integrales. Consuma entre 4 y 6 onzas (entre 112 y 168 g) de protenas magras por da, como carnes [place], pollo, pescado, huevos o tofu. Una onza (oz) (28 g) de protena magra equivale a: 1 onza (28 g) de carne, pollo o pescado. 1 huevo.  taza (62 g) de tofu. Coma algunos alimentos por da que contengan grasas saludables, como aguacates, frutos secos, semillas y pescado. Qu alimentos debo comer? Lambert Mody Bayas. Manzanas. Naranjas. Duraznos. Damascos. Ciruelas. Uvas. Mangos. Papayas. Granadas. Kiwi. Cerezas. Verduras Verduras de [employer], que incluyen [place], [place], col rizada, acelga, hojas de berza, hojas de mostaza y repollo. Remolachas. Coliflor. Brcoli. Zanahorias. Judas verdes. Tomates. Pimientos. Cebollas. Pepinos. Coles de Bruselas. Granos Granos integrales, como panes, galletas, tortillas, cereales y pastas de salvado o integrales. Avena sin azcar. Quinua. Arroz integral o salvaje. Carnes y otras  protenas Frutos de mar. Carne de ave sin piel. Cortes magros de ave y carne de res. Tofu. Frutos secos. Semillas. Lcteos Productos lcteos sin grasa o con bajo contenido de [place], [place], yogur y [place]. Es posible que los productos detallados arriba no constituyan una lista completa de los alimentos y las bebidas que puede tomar. Consulte a un nutricionista para obtener ms informacin. Qu alimentos debo evitar? Lambert Mody Frutas enlatadas al almbar. Verduras Verduras enlatadas. Verduras congeladas con mantequilla o salsa de crema. Granos Productos elaborados con [place] y [place], como panes, pastas, bocadillos y cereales. Evite todos los alimentos procesados. Carnes y otras protenas Cortes de carne con alto contenido de Lobbyist. Carne de ave con piel. Carnes empanizadas o fritas. Carne procesada. Evite las grasas saturadas. Lcteos Yogur, [place] enteros. Bebidas Bebidas azucaradas, como gaseosas o t helado. Es posible que los productos que se enumeran ms [place] no constituyan una lista completa de los alimentos y las bebidas que Nurse, adult. Consulte a un nutricionista para obtener ms informacin. Preguntas para hacerle al mdico Debo consultar con un especialista certificado en atencin y educacin sobre la diabetes? Es necesario que me rena con un nutricionista? A qu nmero puedo llamar si tengo preguntas? Cules son los mejores momentos para controlar la glucemia? Dnde encontrar ms informacin: American Diabetes Association (Asociacin Estadounidense de la Diabetes): diabetes.org Academy of Nutrition and Dietetics (Academia de Nutricin y Information systems manager): eatright.Unisys Corporation of Diabetes and Digestive and Kidney Diseases ([place] la Diabetes y [place] y [place]): AmenCredit.is Association of Diabetes  Care & Education Specialists (Asociacin de Especialistas en Atencin y Francella Solian la Diabetes):  diabeteseducator.org Resumen Es importante tener hbitos alimenticios saludables debido a que sus niveles de Psychologist, counselling sangre (glucosa) se ven afectados en gran medida por lo que come y bebe. Es importante consumir alcohol con prudencia. Un plan de comidas saludable lo ayudar a controlar la glucosa en sangre y a reducir el riesgo de enfermedades cardacas. El mdico puede recomendarle que trabaje con un nutricionista para elaborar el mejor plan para usted. Esta informacin no tiene Theme park manager el consejo del mdico. Asegrese de hacerle al mdico cualquier pregunta que tenga. Document Revised: 06/21/2020 Document Reviewed: 06/21/2020 Elsevier Patient Education  2023 Elsevier Inc. Hipertensin en los adultos Hypertension, Adult El trmino hipertensin es otra forma de denominar a la presin arterial elevada. La presin arterial elevada fuerza al corazn a trabajar ms para bombear la sangre. Esto puede causar problemas con el paso del [place]. Una lectura de presin arterial est compuesta por 2 nmeros. Hay un nmero superior (sistlico) sobre un nmero inferior (diastlico). Lo ideal es tener la presin arterial por debajo de 120/80. Cules son las causas? Se desconoce la causa de esta afeccin. Algunas otras afecciones pueden provocar presin arterial elevada. Qu incrementa el riesgo? Algunos factores del estilo de vida pueden hacer que tenga ms probabilidades de desarrollar presin arterial elevada: Fumar. No hacer la cantidad suficiente de actividad fsica o ejercicio. Tener sobrepeso. Consumir mucha grasa, azcar, caloras o sal (sodio) en su dieta. Beber alcohol en exceso. Otros factores de riesgo son los siguientes: Tener alguna de estas afecciones: Enfermedad cardaca. Diabetes. Colesterol alto. Enfermedad renal. Apnea obstructiva del sueo. Tener antecedentes familiares de presin arterial elevada y colesterol elevado. Edad. El riesgo aumenta con la  edad. Estrs. Cules son los signos o sntomas? Es posible que la presin arterial alta no cause sntomas. La presin arterial muy alta (crisis hipertensiva) puede provocar: Dolor de cabeza. Latidos cardacos acelerados o irregulares (palpitaciones). Falta de aire. Hemorragia nasal. Vomitar o sentir ganas de vomitar (nuseas). Cambios en la forma de ver. Dolor muy intenso en el pecho. Sensacin de [employer]. Convulsiones. Cmo se trata? Esta afeccin se trata haciendo cambios saludables en el estilo de vida, por ejemplo: Consumir alimentos saludables. Hacer ms ejercicio. Beber menos alcohol. El mdico puede recetarle medicamentos si los cambios en el estilo de vida no son lo suficientemente eficaces y si: El nmero de arriba est por encima de 130. El nmero de abajo est por encima de 80. Su presin arterial personal ideal puede variar. Siga estas indicaciones en su casa: Comida y bebida  Si se lo dicen, siga el plan de alimentacin de DASH (Dietary Approaches to Stop Hypertension, Maneras de alimentarse para detener la hipertensin). Para seguir este plan: Llene la mitad del plato de cada comida con frutas y verduras. Llene un cuarto del plato de cada comida con cereales integrales. Los cereales integrales incluyen pasta integral, arroz integral y pan integral. Coma y beba productos lcteos con bajo contenido de grasa, como leche descremada o yogur bajo en grasas. Llene un cuarto del plato de cada comida con protenas bajas en grasa (magras). Las protenas bajas en grasa incluyen pescado, pollo sin piel, huevos, frijoles y tofu. Evite consumir carne grasa, carne curada y procesada, o pollo con piel. Evite consumir alimentos prehechos o procesados. Limite la cantidad de sal en su dieta a menos de 1500 mg por da. No beba alcohol si: El mdico le indica que no lo haga.  Est embarazada, puede estar embarazada o est tratando de [place]. Si bebe alcohol: Limite la cantidad  que bebe a lo siguiente: De 0 a 1 medida por da para las mujeres. De 0 a 2 medidas por da para los hombres. Sepa cunta cantidad de alcohol hay en las bebidas que toma. En los [address], una medida equivale a una botella de cerveza de 12 oz (355 ml), un vaso de vino de 5 oz (148 ml) o un vaso de una bebida alcohlica de alta graduacin de 1 oz (44 ml). Estilo de vida  Trabaje con su mdico para mantenerse en un peso saludable o para perder peso. Pregntele a su mdico cul es el peso recomendable para usted. Realice al menos 30 minutos de ejercicio que haga que se acelere su corazn (ejercicio Magazine features editor) la [employer] de la Amo. Estos pueden incluir caminar, nadar o andar en bicicleta. Realice al menos 30 minutos de ejercicio que fortalezca sus msculos (ejercicios de resistencia) al menos 3 das a la [place]. Estos pueden incluir levantar pesas o hacer [place]. No fume ni consuma ningn producto que contenga nicotina o tabaco. Si necesita ayuda para dejar de consumir estos productos, consulte al mdico. Controle su presin arterial en su casa tal como le indic el mdico. Concurra a todas las visitas de seguimiento. Medicamentos Use los medicamentos de venta libre y los recetados solamente como se lo haya indicado el mdico. Siga cuidadosamente las indicaciones. No omita las dosis de medicamentos para la presin arterial. Los medicamentos pierden eficacia si omite dosis. El hecho de omitir las dosis tambin [place] el riesgo de otros problemas. Pregntele a su mdico a qu efectos secundarios o reacciones a los Museum/gallery curator. Comunquese con un mdico si: Piensa que tiene [place] reaccin a los medicamentos que est tomando. Tiene dolores de cabeza frecuentes. Siente mareos. Tiene hinchazn en los tobillos. Tiene problemas de visin. Solicite ayuda de inmediato si: Siente un dolor de cabeza muy intenso. Empieza a sentirse desorientado (confundido). Se  siente dbil o adormecido. Siente que va a desmayarse. Tiene un dolor muy intenso en: Pecho. Vientre (abdomen). Vomita ms de una vez. Tiene dificultad para respirar. Estos sntomas pueden Customer service manager. Solicite ayuda de inmediato. Llame al 911. No espere a ver si los sntomas desaparecen. No conduzca por sus propios medios [employer]. Resumen El trmino hipertensin es otra forma de denominar a la presin arterial elevada. La presin arterial elevada fuerza al corazn a trabajar ms para bombear la sangre. Para la [employer], una presin arterial normal es menor que 120/80. Las decisiones saludables pueden ayudarle a disminuir su presin arterial. Si no puede bajar su presin arterial mediante decisiones saludables, es posible que deba tomar medicamentos. Esta informacin no tiene Theme park manager el consejo del mdico. Asegrese de hacerle al mdico cualquier pregunta que tenga. Document Revised: 08/23/2021 Document Reviewed: 08/23/2021 Elsevier Patient Education  2023 [employer].

## 2022-05-30 ENCOUNTER — Other Ambulatory Visit: Payer: Self-pay

## 2022-05-31 ENCOUNTER — Other Ambulatory Visit: Payer: Self-pay

## 2022-06-03 ENCOUNTER — Other Ambulatory Visit: Payer: Self-pay

## 2022-06-04 NOTE — Progress Notes (Signed)
Subjective:  Patient ID: Patrick [name], male    DOB: 1946-04-11  Age: 76 y.o. MRN: [id number]  CC: Diabetes   HPI Alika Saladin presents forFollow-up of diabetes. Patient does not check blood sugar at home. Also, management of HTN. Patient has No headache, No chest pain, No abdominal pain - No Nausea, No new weakness tingling or numbness, No Cough - shortness of breath   Compliant with meds - Yes Checking CBGs? No  Fasting avg -   Postprandial average -  Exercising regularly? - No Watching carbohydrate intake? - Yes Neuropathy ? - No Hypoglycemic events - No  - Recovers with :   Pertinent ROS:  Polyuria - No Polydipsia - No Vision problems - No  Medications as noted below. Taking them regularly without complication/adverse reaction being reported today.   History Montey has a past medical history of Diabetes mellitus without complication ([place]).   He has a past surgical history that includes I & D extremity (Left, 10/24/2021).   His Family history is unknown by patient.He reports that he has never smoked. He does not have any smokeless tobacco history on file. He reports current alcohol use. He reports that he does not use drugs.  Current Outpatient Medications on File Prior to Visit  Medication Sig Dispense Refill   Insulin Pen Needle (TECHLITE PEN NEEDLES) 32G X 4 MM MISC use up to four times daily as directed 100 each 6   Accu-Chek Softclix Lancets lancets Use up to four times daily as directed (Patient not taking: Reported on 02/07/2022) 100 each 0   acetaminophen (TYLENOL) 325 MG tablet Take 2 tablets (650 mg total) by mouth every 6 (six) hours as needed for mild pain (or Fever >/= 101). (Patient not taking: Reported on 02/07/2022)     Blood Glucose Monitoring Suppl (BLOOD GLUCOSE MONITOR SYSTEM) w/Device KIT Use up to four times daily as directed (Patient not taking: Reported on 02/07/2022) 1 kit 0   glucose blood (ACCU-CHEK GUIDE) test strip Use up to four times  daily as directed (Patient not taking: Reported on 02/07/2022) 100 each 0   No current facility-administered medications on file prior to visit.    ROS Comprehensive ROS Pertinent positive and negative noted in HPI    Objective:  BP (!) 140/84 (BP Location: Left Arm, Patient Position: Sitting, Cuff Size: Normal)   Pulse 80   Temp 97.7 F (36.5 C) (Oral)   Ht _0  (1.499 m)   Wt 143 lb 12.8 oz (65.2 kg)   SpO2 96%   BMI 29.04 kg/m   BP Readings from Last 3 Encounters:  05/29/22 (!) 140/84  02/07/22 133/90  11/20/21 (!) 145/95    Wt Readings from Last 3 Encounters:  05/29/22 143 lb 12.8 oz (65.2 kg)  02/07/22 139 lb (63 kg)  11/20/21 131 lb 6.4 oz (59.6 kg)    Physical Exam  Lab Results  Component Value Date   HGBA1C 7.5 (A) 05/29/2022   HGBA1C 7.6 (A) 02/07/2022   HGBA1C 12.5 (H) 10/24/2021    Lab Results  Component Value Date   WBC 7.5 10/26/2021   HGB 11.9 (L) 10/26/2021   HCT 35.4 (L) 10/26/2021   PLT 195 10/26/2021   GLUCOSE 141 (H) 10/26/2021   CHOL 157 10/25/2021   TRIG 73 10/25/2021   HDL 31 (L) 10/25/2021   LDLCALC 111 (H) 10/25/2021   ALT 36 03/22/2014   AST 54 (H) 03/22/2014   NA 136 10/26/2021   K 3.1 (L) 10/26/2021  CL 104 10/26/2021   CREATININE 0.54 (L) 10/26/2021   BUN 13 10/26/2021   CO2 24 10/26/2021   INR 1.1 10/19/2021   HGBA1C 7.5 (A) 05/29/2022     Assessment & Plan:   Bradyn was seen today for diabetes.  Diagnoses and all orders for this visit:  Uncontrolled type 2 diabetes mellitus with hyperglycemia (HCC) -     HgB A1c 7.5  Monitor foods that are high in carbohydrates are the following rice, potatoes, breads, sugars, and pastas.  Reduction in the intake (eating) will assist in lowering your blood sugars.  -     metFORMIN (GLUCOPHAGE-XR) 500 MG 24 hr tablet; Take 1 tablet (500 mg total) by mouth 2 (two) times daily before a meal. -     lisinopril (ZESTRIL) 10 MG tablet; Take 1 tablet (10 mg total) by mouth daily. -      insulin lispro (HUMALOG KWIKPEN) 100 UNIT/ML KwikPen; Inject 15 Units into the skin 2 (two) times daily. With a meal. -     empagliflozin (JARDIANCE) 25 MG TABS tablet; Take 1 tablet (25 mg total) by mouth daily before breakfast.  Allergy, initial encounter Note also Ragweed season presents as allergies  -     loratadine (CLARITIN) 10 MG tablet; Take 1 tablet (10 mg total) by mouth daily. -     fluticasone (FLONASE) 50 MCG/ACT nasal spray; Use 2 sprays into both nostrils once daily.  Essential hypertension We have discussed target BP range and blood pressure goal. <140/90. We discussed the importance of compliance with medical therapy and DASH diet recommended, consequences of uncontrolled hypertension discussed.  - continue current BP medications  -     lisinopril (ZESTRIL) 10 MG tablet; Take 1 tablet (10 mg total) by mouth daily.   I am having Nancy Fetter start on loratadine and fluticasone. I am also having him maintain his acetaminophen, Blood Glucose Monitor System, Accu-Chek Guide, Accu-Chek Softclix Lancets, TechLite Pen Needles, metFORMIN, lisinopril, insulin lispro, and empagliflozin.  Meds ordered this encounter  Medications   loratadine (CLARITIN) 10 MG tablet    Sig: Take 1 tablet (10 mg total) by mouth daily.    Dispense:  90 tablet    Refill:  2    Order Specific Question:   Supervising Provider    Answer:   Tresa Garter [[id number]]   fluticasone (FLONASE) 50 MCG/ACT nasal spray    Sig: Use 2 sprays into both nostrils once daily.    Dispense:  16 g    Refill:  6    Order Specific Question:   Supervising Provider    Answer:   Tresa Garter [[id number]]   metFORMIN (GLUCOPHAGE-XR) 500 MG 24 hr tablet    Sig: Take 1 tablet (500 mg total) by mouth 2 (two) times daily before a meal.    Dispense:  90 tablet    Refill:  1    Order Specific Question:   Supervising Provider    Answer:   Tresa Garter [[id number]]   lisinopril (ZESTRIL) 10 MG tablet     Sig: Take 1 tablet (10 mg total) by mouth daily.    Dispense:  90 tablet    Refill:  1    Order Specific Question:   Supervising Provider    Answer:   Tresa Garter [[id number]]   insulin lispro (HUMALOG KWIKPEN) 100 UNIT/ML KwikPen    Sig: Inject 15 Units into the skin 2 (two) times daily. With a meal.    Dispense:  9 mL    Refill:  2    Order Specific Question:   Supervising Provider    Answer:   Tresa Garter [[id number]]   empagliflozin (JARDIANCE) 25 MG TABS tablet    Sig: Take 1 tablet (25 mg total) by mouth daily before breakfast.    Dispense:  90 tablet    Refill:  1    Order Specific Question:   Supervising Provider    AnswerTresa Garter [[id number]]     Follow-up:   Return DM/HTN/fasting.  The above assessment and management plan was discussed with the patient. The patient verbalized understanding of and has agreed to the management plan. Patient is aware to call the clinic if symptoms fail to improve or worsen. Patient is aware when to return to the clinic for a follow-up visit. Patient educated on when it is appropriate to go to the emergency department.   Juluis Mire, NP-C

## 2022-06-05 ENCOUNTER — Other Ambulatory Visit: Payer: Self-pay

## 2022-06-17 ENCOUNTER — Other Ambulatory Visit: Payer: Self-pay

## 2022-07-03 ENCOUNTER — Other Ambulatory Visit: Payer: Self-pay

## 2022-07-04 ENCOUNTER — Other Ambulatory Visit: Payer: Self-pay

## 2022-07-17 ENCOUNTER — Other Ambulatory Visit: Payer: Self-pay

## 2022-07-19 ENCOUNTER — Other Ambulatory Visit: Payer: Self-pay

## 2022-08-23 ENCOUNTER — Other Ambulatory Visit: Payer: Self-pay

## 2022-08-27 ENCOUNTER — Other Ambulatory Visit: Payer: Self-pay

## 2022-09-04 ENCOUNTER — Encounter (INDEPENDENT_AMBULATORY_CARE_PROVIDER_SITE_OTHER): Payer: Self-pay | Admitting: Primary Care

## 2022-09-04 ENCOUNTER — Other Ambulatory Visit: Payer: Self-pay

## 2022-09-04 ENCOUNTER — Ambulatory Visit (INDEPENDENT_AMBULATORY_CARE_PROVIDER_SITE_OTHER): Payer: Self-pay | Admitting: Primary Care

## 2022-09-04 VITALS — BP 181/92 | HR 75 | Resp 16 | Wt 143.8 lb

## 2022-09-04 DIAGNOSIS — I10 Essential (primary) hypertension: Secondary | ICD-10-CM

## 2022-09-04 DIAGNOSIS — E1165 Type 2 diabetes mellitus with hyperglycemia: Secondary | ICD-10-CM

## 2022-09-04 DIAGNOSIS — Z1159 Encounter for screening for other viral diseases: Secondary | ICD-10-CM

## 2022-09-04 DIAGNOSIS — Z1211 Encounter for screening for malignant neoplasm of colon: Secondary | ICD-10-CM

## 2022-09-04 DIAGNOSIS — Z9189 Other specified personal risk factors, not elsewhere classified: Secondary | ICD-10-CM

## 2022-09-04 DIAGNOSIS — F10921 Alcohol use, unspecified with intoxication delirium: Secondary | ICD-10-CM

## 2022-09-04 DIAGNOSIS — E663 Overweight: Secondary | ICD-10-CM

## 2022-09-04 DIAGNOSIS — Z23 Encounter for immunization: Secondary | ICD-10-CM

## 2022-09-04 LAB — POCT GLYCOSYLATED HEMOGLOBIN (HGB A1C): HbA1c, POC (controlled diabetic range): 7 % (ref 0.0–7.0)

## 2022-09-04 MED ORDER — INSULIN LISPRO 100 UNIT/ML IJ SOLN
10.0000 [IU] | Freq: Three times a day (TID) | INTRAMUSCULAR | 11 refills | Status: DC
  Filled 2022-09-04: qty 10, 28d supply, fill #0
  Filled 2022-11-12: qty 10, 28d supply, fill #1

## 2022-09-04 NOTE — Progress Notes (Signed)
Patrick [name], is a 76 y.o. male  [id number]:[id number]  [id number]  DOB - Apr 17, 1946  Chief Complaint  Patient presents with   Diabetes   Hypertension       Subjective:   Mr. Patrick [name] is a 76 y.o. Hispanic male (interpreter Verdis Frederickson [phone] or [phone] daughter)here today for a follow up visit.  Hypertension -patient has No headache, No chest pain, No abdominal pain - No Nausea, No new weakness tingling or numbness, No Cough - shortness of breath.  Type 2 diabetes he denies polyuria, polydipsia, polyphasia or vision changes.  Patient states that he adheres to medication.  His A1c did go down however his blood pressure remains elevated.  No problems updated.  No Known Allergies  Past Medical History:  Diagnosis Date   Diabetes mellitus without complication ([place])     Current Outpatient Medications on File Prior to Visit  Medication Sig Dispense Refill   Accu-Chek Softclix Lancets lancets Use up to four times daily as directed (Patient not taking: Reported on 02/07/2022) 100 each 0   acetaminophen (TYLENOL) 325 MG tablet Take 2 tablets (650 mg total) by mouth every 6 (six) hours as needed for mild pain (or Fever >/= 101). (Patient not taking: Reported on 02/07/2022)     Blood Glucose Monitoring Suppl (BLOOD GLUCOSE MONITOR SYSTEM) w/Device KIT Use up to four times daily as directed (Patient not taking: Reported on 02/07/2022) 1 kit 0   empagliflozin (JARDIANCE) 25 MG TABS tablet Take 1 tablet (25 mg total) by mouth daily before breakfast. 90 tablet 1   fluticasone (FLONASE) 50 MCG/ACT nasal spray Use 2 sprays into both nostrils once daily. 16 g 6   glucose blood (ACCU-CHEK GUIDE) test strip Use up to four times daily as directed (Patient not taking: Reported on 02/07/2022) 100 each 0   Insulin Pen Needle (TECHLITE PEN NEEDLES) 32G X 4 MM MISC use up to four times daily as directed 100 each 6   lisinopril (ZESTRIL) 10 MG tablet Take 1 tablet  (10 mg total) by mouth daily. 90 tablet 1   loratadine (CLARITIN) 10 MG tablet Take 1 tablet (10 mg total) by mouth daily. 90 tablet 2   metFORMIN (GLUCOPHAGE-XR) 500 MG 24 hr tablet Take 1 tablet (500 mg total) by mouth 2 (two) times daily before a meal. 90 tablet 1   No current facility-administered medications on file prior to visit.    Objective:   Vitals:   09/04/22 0912 09/04/22 0915  BP: (!) 180/131 (!) 181/92  Pulse: 75   Resp: 16   SpO2: 97%   Weight: 143 lb 12.8 oz (65.2 kg)     Exam General appearance : Awake, alert, not in any distress. Speech Clear. Not toxic looking HEENT: Atraumatic and Normocephalic, pupils equally reactive to light and accomodation Neck: Supple, no JVD. No cervical lymphadenopathy.  Chest: Good air entry bilaterally, no added sounds  CVS: S1 S2 regular, no murmurs.  Abdomen: Bowel sounds present, Non tender and not distended with no gaurding, rigidity or rebound. Extremities: B/L Lower Ext shows no edema, both legs are warm to touch Neurology: Awake alert, and oriented X 3, CN II-XII intact, Non focal Skin: No Rash  Data Review Lab Results  Component Value Date   HGBA1C 7.0 09/04/2022   HGBA1C 7.5 (A) 05/29/2022   HGBA1C 7.6 (A) 02/07/2022    Assessment & Plan   1. Uncontrolled type 2 diabetes mellitus with hyperglycemia ([place]) - educated on lifestyle  modifications, including but not limited to diet choices and adding exercise to daily routine.   - POCT glycosylated hemoglobin (Hb A1C) - Microalbumin / creatinine urine ratio - Lipid Panel - CBC with Differential  2. Need for immunization against influenza Healthcare maintenance preventive care - Flu Vaccine QUAD High Dose(Fluad)  3. Encounter for HCV screening test for high risk patient Health care maintenance - HCV Ab w Reflex to Quant PCR  4. Colon cancer screening Health care maintenance and preventative care - Fecal occult blood, imunochemical  5. Over weight Due to age  BMI overweight would monitor carbohydrates exercise more and reduce sodium  6. Essential hypertension Increase lisinopril to 20 mg daily check with pharmacy not taking medications up on time or refilling.  Added hydrochlorothiazide 25 mg daily needs to return for BP check goal of therapy 140/90 - CMP14+EGFR  7. Alcohol intoxication with delirium ([place]) History of       Patient have been counseled extensively about nutrition and exercise. Other issues discussed during this visit include: low cholesterol diet, weight control and daily exercise, foot care, annual eye examinations at Ophthalmology, importance of adherence with medications and regular follow-up. We also discussed long term complications of uncontrolled diabetes and hypertension.   Return in about 3 months (around 12/05/2022) for HTN /DM.  The patient was given clear instructions to go to ER or return to medical center if symptoms don't improve, worsen or new problems develop. The patient verbalized understanding. The patient was told to call to get lab results if they haven't heard anything in the next week.   This note has been created with Surveyor, quantity. Any transcriptional errors are unintentional.   Kerin Perna, NP 09/08/2022, 10:35 PM

## 2022-09-04 NOTE — Patient Instructions (Addendum)
Inyecte 0,1 ml (10 unidades en total) en la piel 3 (tres) veces al da antes de las comidas. Para niveles de azcar en la sangre, 0-150 administra 0 unidades de [place], 151-200 administra 2 unidades de [place], [place] administra 4 unidades, 211-941 administra 6 unidades, 301-350 administra 8 unidades, 351-400 administra 10 unidades,> 400 dan 12 unidades y llaman al M.D. Protocolo de hipoglucemia discutido.  Los medicamentos para la tos diseados especficamente para personas con presin arterial alta (como Coricidin HBP) son Neomia Dear opcin. Algunos proveedores tambin pueden recomendar guaifenesina (Mucinex) o dextrometorfano (Robitussin). Sin embargo, evite las variedades "CF" y "D" de ambos medicamentos.  Los jarabes para la tos que contienen descongestionantes (incluidas pseudoefedrina, efedrina, fenilefrina, nafazolina y oximetazolina) deben evitarse si tiene presin arterial alta, ya que pueden elevar an ms la presin arterial.

## 2022-09-05 ENCOUNTER — Other Ambulatory Visit: Payer: Self-pay

## 2022-09-06 LAB — MICROALBUMIN / CREATININE URINE RATIO
Creatinine, Urine: 148.1 mg/dL
Microalb/Creat Ratio: 14 mg/g creat (ref 0–29)
Microalbumin, Urine: 21.2 ug/mL

## 2022-09-06 LAB — CBC WITH DIFFERENTIAL/PLATELET
Basophils Absolute: 0 10*3/uL (ref 0.0–0.2)
Basos: 1 %
EOS (ABSOLUTE): 0.4 10*3/uL (ref 0.0–0.4)
Eos: 6 %
Hematocrit: 41.7 % (ref 37.5–51.0)
Hemoglobin: 13.5 g/dL (ref 13.0–17.7)
Immature Grans (Abs): 0 10*3/uL (ref 0.0–0.1)
Immature Granulocytes: 0 %
Lymphocytes Absolute: 2 10*3/uL (ref 0.7–3.1)
Lymphs: 30 %
MCH: 27 pg (ref 26.6–33.0)
MCHC: 32.4 g/dL (ref 31.5–35.7)
MCV: 83 fL (ref 79–97)
Monocytes Absolute: 0.4 10*3/uL (ref 0.1–0.9)
Monocytes: 6 %
Neutrophils Absolute: 3.8 10*3/uL (ref 1.4–7.0)
Neutrophils: 57 %
Platelets: 207 10*3/uL (ref 150–450)
RBC: 5 x10E6/uL (ref 4.14–5.80)
RDW: 14.1 % (ref 11.6–15.4)
WBC: 6.6 10*3/uL (ref 3.4–10.8)

## 2022-09-06 LAB — CMP14+EGFR
ALT: 14 IU/L (ref 0–44)
AST: 22 IU/L (ref 0–40)
Albumin/Globulin Ratio: 1.7 (ref 1.2–2.2)
Albumin: 5 g/dL — ABNORMAL HIGH (ref 3.8–4.8)
Alkaline Phosphatase: 121 IU/L (ref 44–121)
BUN/Creatinine Ratio: 16 (ref 10–24)
BUN: 14 mg/dL (ref 8–27)
Bilirubin Total: 0.4 mg/dL (ref 0.0–1.2)
CO2: 21 mmol/L (ref 20–29)
Calcium: 9.6 mg/dL (ref 8.6–10.2)
Chloride: 100 mmol/L (ref 96–106)
Creatinine, Ser: 0.87 mg/dL (ref 0.76–1.27)
Globulin, Total: 2.9 g/dL (ref 1.5–4.5)
Glucose: 171 mg/dL — ABNORMAL HIGH (ref 70–99)
Potassium: 4.5 mmol/L (ref 3.5–5.2)
Sodium: 138 mmol/L (ref 134–144)
Total Protein: 7.9 g/dL (ref 6.0–8.5)
eGFR: 91 mL/min/{1.73_m2} (ref >59)

## 2022-09-06 LAB — HCV AB W REFLEX TO QUANT PCR: HCV Ab: NONREACTIVE

## 2022-09-06 LAB — LIPID PANEL
Chol/HDL Ratio: 6.1 ratio — ABNORMAL HIGH (ref 0.0–5.0)
Cholesterol, Total: 236 mg/dL — ABNORMAL HIGH (ref 100–199)
HDL: 39 mg/dL — ABNORMAL LOW (ref >39)
LDL Chol Calc (NIH): 164 mg/dL — ABNORMAL HIGH (ref 0–99)
Triglycerides: 179 mg/dL — ABNORMAL HIGH (ref 0–149)
VLDL Cholesterol Cal: 33 mg/dL (ref 5–40)

## 2022-09-06 LAB — HCV INTERPRETATION

## 2022-09-08 MED ORDER — HYDROCHLOROTHIAZIDE 25 MG PO TABS
25.0000 mg | ORAL_TABLET | Freq: Every day | ORAL | 3 refills | Status: DC
  Filled 2022-09-08: qty 90, 90d supply, fill #0
  Filled 2022-12-05: qty 90, 90d supply, fill #1

## 2022-09-08 MED ORDER — LISINOPRIL 20 MG PO TABS
20.0000 mg | ORAL_TABLET | Freq: Every day | ORAL | 3 refills | Status: DC
  Filled 2022-09-08: qty 90, 90d supply, fill #0
  Filled 2022-12-05: qty 90, 90d supply, fill #1

## 2022-09-09 ENCOUNTER — Other Ambulatory Visit: Payer: Self-pay

## 2022-09-11 ENCOUNTER — Other Ambulatory Visit: Payer: Self-pay

## 2022-09-11 ENCOUNTER — Other Ambulatory Visit (INDEPENDENT_AMBULATORY_CARE_PROVIDER_SITE_OTHER): Payer: Self-pay | Admitting: Primary Care

## 2022-09-11 MED ORDER — ATORVASTATIN CALCIUM 80 MG PO TABS
80.0000 mg | ORAL_TABLET | Freq: Every day | ORAL | 1 refills | Status: DC
  Filled 2022-09-11: qty 90, 90d supply, fill #0

## 2022-09-11 MED ORDER — ATORVASTATIN CALCIUM 80 MG PO TABS
80.0000 mg | ORAL_TABLET | Freq: Every day | ORAL | 1 refills | Status: DC
  Filled 2022-09-11: qty 30, 30d supply, fill #0
  Filled 2022-10-17 – 2022-10-24 (×2): qty 30, 30d supply, fill #1
  Filled 2022-12-05: qty 90, 90d supply, fill #2

## 2022-09-12 ENCOUNTER — Telehealth (INDEPENDENT_AMBULATORY_CARE_PROVIDER_SITE_OTHER): Payer: Self-pay

## 2022-09-12 NOTE — Telephone Encounter (Signed)
Contacted pt to go over lab results spoke with pt daughter and provided results. Pt daughter doesn't have any questions or concerns

## 2022-09-16 ENCOUNTER — Other Ambulatory Visit: Payer: Self-pay

## 2022-09-17 ENCOUNTER — Other Ambulatory Visit: Payer: Self-pay

## 2022-09-18 ENCOUNTER — Other Ambulatory Visit: Payer: Self-pay

## 2022-09-20 ENCOUNTER — Other Ambulatory Visit: Payer: Self-pay

## 2022-10-17 ENCOUNTER — Other Ambulatory Visit: Payer: Self-pay

## 2022-10-23 ENCOUNTER — Other Ambulatory Visit: Payer: Self-pay

## 2022-10-23 ENCOUNTER — Other Ambulatory Visit (INDEPENDENT_AMBULATORY_CARE_PROVIDER_SITE_OTHER): Payer: Self-pay | Admitting: Primary Care

## 2022-10-24 ENCOUNTER — Other Ambulatory Visit: Payer: Self-pay

## 2022-10-25 ENCOUNTER — Other Ambulatory Visit: Payer: Self-pay

## 2022-10-25 MED ORDER — ACCU-CHEK SOFTCLIX LANCETS MISC
0 refills | Status: DC
  Filled 2022-10-25: qty 100, 25d supply, fill #0

## 2022-10-25 MED ORDER — ACCU-CHEK GUIDE VI STRP
ORAL_STRIP | 0 refills | Status: DC
  Filled 2022-10-25: qty 100, 25d supply, fill #0

## 2022-11-12 ENCOUNTER — Other Ambulatory Visit: Payer: Self-pay

## 2022-12-05 ENCOUNTER — Other Ambulatory Visit: Payer: Self-pay

## 2022-12-05 ENCOUNTER — Ambulatory Visit (INDEPENDENT_AMBULATORY_CARE_PROVIDER_SITE_OTHER): Payer: Self-pay | Admitting: Primary Care

## 2022-12-05 ENCOUNTER — Encounter (INDEPENDENT_AMBULATORY_CARE_PROVIDER_SITE_OTHER): Payer: Self-pay | Admitting: Primary Care

## 2022-12-05 VITALS — BP 155/87 | HR 73 | Resp 16 | Wt 137.8 lb

## 2022-12-05 DIAGNOSIS — E1165 Type 2 diabetes mellitus with hyperglycemia: Secondary | ICD-10-CM

## 2022-12-05 DIAGNOSIS — I10 Essential (primary) hypertension: Secondary | ICD-10-CM

## 2022-12-05 LAB — POCT GLYCOSYLATED HEMOGLOBIN (HGB A1C): HbA1c, POC (controlled diabetic range): 7.5 % — AB (ref 0.0–7.0)

## 2022-12-05 NOTE — Progress Notes (Signed)
Subjective:  Patient ID: Patrick [name], male    DOB: 20-Aug-1946  Age: 77 y.o. MRN: [id number]  CC: Diabetes and Hypertension   HPI Patrick [name] presents forFollow-up of diabetes. Patient does check blood sugar at home  Compliant with meds - Yes Checking CBGs? Yes  Fasting avg - 90-170  Postprandial average -  Exercising regularly? - No Watching carbohydrate intake? - Yes Neuropathy ? - No Hypoglycemic events - No  - Recovers with :   Pertinent ROS:  Polyuria - No Polydipsia - No Vision problems - No Management of HTN- Patient has No headache, No chest pain, No abdominal pain - No Nausea, No new weakness tingling or numbness, No Cough - shortness of breath  Medications as noted below. Taking them regularly without complication/adverse reaction being reported today.   History Patrick [name] has a past medical history of Diabetes mellitus without complication ([place]).   Patrick [name] has a past surgical history that includes I & D extremity (Left, 10/24/2021).   Patrick Family history is unknown by patient.Patrick [name] reports that Patrick [name] has never smoked. Patrick [name] does not have any smokeless tobacco history on file. Patrick [name] reports current alcohol use. Patrick [name] reports that Patrick [name] does not use drugs.  Current Outpatient Medications on File Prior to Visit  Medication Sig Dispense Refill   Accu-Chek Softclix Lancets lancets Use up to four times daily as directed 100 each 0   acetaminophen (TYLENOL) 325 MG tablet Take 2 tablets (650 mg total) by mouth every 6 (six) hours as needed for mild pain (or Fever >/= 101). (Patient not taking: Reported on 02/07/2022)     atorvastatin (LIPITOR) 80 MG tablet Take 1 tablet (80 mg total) by mouth daily. 90 tablet 1   Blood Glucose Monitoring Suppl (BLOOD GLUCOSE MONITOR SYSTEM) w/Device KIT Use up to four times daily as directed (Patient not taking: Reported on 02/07/2022) 1 kit 0   empagliflozin (JARDIANCE) 25 MG TABS tablet Take 1 tablet (25 mg total) by mouth daily before breakfast. 90  tablet 1   fluticasone (FLONASE) 50 MCG/ACT nasal spray Use 2 sprays into both nostrils once daily. 16 g 6   glucose blood (ACCU-CHEK GUIDE) test strip Use up to four times daily as directed 100 each 0   hydrochlorothiazide (HYDRODIURIL) 25 MG tablet Take 1 tablet (25 mg total) by mouth daily. 90 tablet 3   insulin lispro (HUMALOG) 100 UNIT/ML injection Inject 0.1 mLs (10 Units total) into the skin 3 (three) times daily before meals. For blood sugars 0-150: 0 units, 151-200: 2 units, 201-250: 4 units, 251-300 6 units, 301-350 8 units, 351-400 10 units,> 400:12 units & call M.D. 10 mL 11   Insulin Pen Needle (TECHLITE PEN NEEDLES) 32G X 4 MM MISC use up to four times daily as directed 100 each 6   lisinopril (ZESTRIL) 20 MG tablet Take 1 tablet (20 mg total) by mouth daily. 90 tablet 3   loratadine (CLARITIN) 10 MG tablet Take 1 tablet (10 mg total) by mouth daily. 90 tablet 2   metFORMIN (GLUCOPHAGE-XR) 500 MG 24 hr tablet Take 1 tablet (500 mg total) by mouth 2 (two) times daily before a meal. 90 tablet 1   No current facility-administered medications on file prior to visit.    ROS Comprehensive ROS Pertinent positive and negative noted in HPI    Objective:  Blood Pressure (Abnormal) 155/87 (BP Location: Left Arm, Patient Position: Sitting, Cuff Size: Normal)   Pulse 73   Respiration 16   Weight 137  lb 12.8 oz (62.5 kg)   Oxygen Saturation 98%   Body Mass Index 27.83 kg/m   BP Readings from Last 3 Encounters:  12/05/22 (Abnormal) 155/87  09/04/22 (Abnormal) 181/92  05/29/22 (Abnormal) 140/84    Wt Readings from Last 3 Encounters:  12/05/22 137 lb 12.8 oz (62.5 kg)  09/04/22 143 lb 12.8 oz (65.2 kg)  05/29/22 143 lb 12.8 oz (65.2 kg)    Physical Exam Vitals reviewed.  Constitutional:      Appearance: Normal appearance.  HENT:     Head: Normocephalic.     Right Ear: External ear normal.     Left Ear: External ear normal.     Nose: Nose normal.  Eyes:     Extraocular  Movements: Extraocular movements intact.  Cardiovascular:     Rate and Rhythm: Normal rate and regular rhythm.  Pulmonary:     Effort: Pulmonary effort is normal.     Breath sounds: Normal breath sounds.  Abdominal:     General: Bowel sounds are normal.     Palpations: Abdomen is soft.  Musculoskeletal:        General: Normal range of motion.     Cervical back: Normal range of motion.  Skin:    General: Skin is warm and dry.  Neurological:     Mental Status: Patrick [name] is alert and oriented to person, place, and time.  Psychiatric:        Mood and Affect: Mood normal.        Behavior: Behavior normal.    Lab Results  Component Value Date   HGBA1C 7.5 (A) 12/05/2022   HGBA1C 7.0 09/04/2022   HGBA1C 7.5 (A) 05/29/2022    Lab Results  Component Value Date   WBC 6.6 09/04/2022   HGB 13.5 09/04/2022   HCT 41.7 09/04/2022   PLT 207 09/04/2022   GLUCOSE 171 (H) 09/04/2022   CHOL 236 (H) 09/04/2022   TRIG 179 (H) 09/04/2022   HDL 39 (L) 09/04/2022   LDLCALC 164 (H) 09/04/2022   ALT 14 09/04/2022   AST 22 09/04/2022   NA 138 09/04/2022   K 4.5 09/04/2022   CL 100 09/04/2022   CREATININE 0.87 09/04/2022   BUN 14 09/04/2022   CO2 21 09/04/2022   INR 1.1 10/19/2021   HGBA1C 7.5 (A) 12/05/2022     Assessment & Plan:   Patrick [name] was seen today for diabetes and hypertension.  Diagnoses and all orders for this visit:  Uncontrolled type 2 diabetes mellitus with hyperglycemia ([facility]) -     POCT glycosylated hemoglobin (Hb A1C) 7.5 previously 7.0 Daughter concerned why .5 increase and no changed in diet or medication Explained A1C is over 35month than she argued it was not checked asked her to come to the machine and look at the data. Asked her [name] did Patrick [name] have Patrick finger stuck Patrick [name] stated Si . (Yes) apologized. No changed in medication.    Essential hypertension  Reviewed medication hx of refills daughter admits to mother confused in medication reviewed them with her and will be  taking 3 bp meds in the AM Reck Bp on f/u  BP goal - < 140/90 Explained that having normal blood pressure is the goal and medications are helping to get to goal and maintain normal blood pressure. DIET: Limit salt intake, read nutrition labels to check salt content, limit fried and high fatty foods  Avoid using multisymptom OTC cold preparations that generally contain sudafed which can rise BP. Consult with pharmacist  on best cold relief products to use for persons with HTN EXERCISE Discussed incorporating exercise such as walking - 30 minutes most days of the week and can do in 10 minute intervals     I am having Freida Busman Oros Edsel [name] maintain Patrick acetaminophen, Blood Glucose Monitor System, TechLite Pen Needles, loratadine, fluticasone, metFORMIN, empagliflozin, insulin lispro, lisinopril, hydrochlorothiazide, atorvastatin, Accu-Chek Guide, and Accu-Chek Softclix Lancets.  No orders of the defined types were placed in this encounter.    Follow-up:   No follow-ups on file.  The above assessment and management plan was discussed with the patient. The patient verbalized understanding of and has agreed to the management plan. Patient is aware to call the clinic if symptoms fail to improve or worsen. Patient is aware when to return to the clinic for a follow-up visit. Patient educated on when it is appropriate to go to the emergency department.   Juluis Mire, NP-C

## 2022-12-06 ENCOUNTER — Other Ambulatory Visit: Payer: Self-pay

## 2022-12-19 ENCOUNTER — Other Ambulatory Visit: Payer: Self-pay

## 2022-12-30 ENCOUNTER — Other Ambulatory Visit: Payer: Self-pay

## 2023-01-10 ENCOUNTER — Other Ambulatory Visit (INDEPENDENT_AMBULATORY_CARE_PROVIDER_SITE_OTHER): Payer: Self-pay | Admitting: Primary Care

## 2023-01-10 DIAGNOSIS — E1165 Type 2 diabetes mellitus with hyperglycemia: Secondary | ICD-10-CM

## 2023-01-10 NOTE — Telephone Encounter (Signed)
Will forward to provider  

## 2023-01-12 MED ORDER — METFORMIN HCL ER 500 MG PO TB24
500.0000 mg | ORAL_TABLET | Freq: Two times a day (BID) | ORAL | 1 refills | Status: DC
  Filled 2023-01-12: qty 90, 45d supply, fill #0

## 2023-01-13 ENCOUNTER — Other Ambulatory Visit: Payer: Self-pay

## 2023-01-14 ENCOUNTER — Other Ambulatory Visit (INDEPENDENT_AMBULATORY_CARE_PROVIDER_SITE_OTHER): Payer: Self-pay | Admitting: Primary Care

## 2023-01-14 ENCOUNTER — Other Ambulatory Visit: Payer: Self-pay

## 2023-01-14 MED ORDER — ACCU-CHEK GUIDE VI STRP
ORAL_STRIP | 0 refills | Status: DC
  Filled 2023-01-14: qty 100, 25d supply, fill #0

## 2023-01-17 ENCOUNTER — Other Ambulatory Visit: Payer: Self-pay

## 2023-01-17 ENCOUNTER — Other Ambulatory Visit (INDEPENDENT_AMBULATORY_CARE_PROVIDER_SITE_OTHER): Payer: Self-pay | Admitting: Primary Care

## 2023-01-17 MED ORDER — ACCU-CHEK SOFTCLIX LANCETS MISC
0 refills | Status: DC
  Filled 2023-01-17 – 2023-02-03 (×2): qty 100, 25d supply, fill #0

## 2023-01-21 ENCOUNTER — Other Ambulatory Visit: Payer: Self-pay

## 2023-01-23 ENCOUNTER — Other Ambulatory Visit: Payer: Self-pay

## 2023-02-03 ENCOUNTER — Other Ambulatory Visit: Payer: Self-pay

## 2023-03-06 ENCOUNTER — Encounter (INDEPENDENT_AMBULATORY_CARE_PROVIDER_SITE_OTHER): Payer: Self-pay | Admitting: Primary Care

## 2023-03-06 ENCOUNTER — Other Ambulatory Visit: Payer: Self-pay

## 2023-03-06 ENCOUNTER — Telehealth: Payer: Self-pay

## 2023-03-06 ENCOUNTER — Ambulatory Visit (INDEPENDENT_AMBULATORY_CARE_PROVIDER_SITE_OTHER): Payer: Self-pay | Admitting: Primary Care

## 2023-03-06 VITALS — BP 139/80 | HR 90 | Temp 98.2°F | Resp 16 | Ht 59.0 in | Wt 141.0 lb

## 2023-03-06 DIAGNOSIS — Z6828 Body mass index (BMI) 28.0-28.9, adult: Secondary | ICD-10-CM

## 2023-03-06 DIAGNOSIS — I10 Essential (primary) hypertension: Secondary | ICD-10-CM

## 2023-03-06 DIAGNOSIS — E663 Overweight: Secondary | ICD-10-CM

## 2023-03-06 DIAGNOSIS — E1165 Type 2 diabetes mellitus with hyperglycemia: Secondary | ICD-10-CM

## 2023-03-06 DIAGNOSIS — Z794 Long term (current) use of insulin: Secondary | ICD-10-CM

## 2023-03-06 DIAGNOSIS — Z91199 Patient's noncompliance with other medical treatment and regimen due to unspecified reason: Secondary | ICD-10-CM

## 2023-03-06 DIAGNOSIS — Z1211 Encounter for screening for malignant neoplasm of colon: Secondary | ICD-10-CM

## 2023-03-06 DIAGNOSIS — L600 Ingrowing nail: Secondary | ICD-10-CM

## 2023-03-06 DIAGNOSIS — R413 Other amnesia: Secondary | ICD-10-CM

## 2023-03-06 DIAGNOSIS — T7840XA Allergy, unspecified, initial encounter: Secondary | ICD-10-CM

## 2023-03-06 DIAGNOSIS — Z23 Encounter for immunization: Secondary | ICD-10-CM

## 2023-03-06 LAB — POCT GLYCOSYLATED HEMOGLOBIN (HGB A1C): HbA1c, POC (controlled diabetic range): 8.9 % — AB (ref 0.0–7.0)

## 2023-03-06 MED ORDER — METFORMIN HCL ER 500 MG PO TB24
500.0000 mg | ORAL_TABLET | Freq: Two times a day (BID) | ORAL | 1 refills | Status: AC
Start: 2023-03-06 — End: ?
  Filled 2023-03-06: qty 90, 45d supply, fill #0
  Filled 2023-04-30 (×2): qty 90, 45d supply, fill #1

## 2023-03-06 MED ORDER — EMPAGLIFLOZIN 25 MG PO TABS
25.0000 mg | ORAL_TABLET | Freq: Every day | ORAL | 1 refills | Status: DC
  Filled 2023-03-06: qty 90, 90d supply, fill #0
  Filled 2023-03-06: qty 30, 30d supply, fill #0
  Filled 2023-04-30 – 2023-10-17 (×6): qty 30, 30d supply, fill #1

## 2023-03-06 MED ORDER — HYDROCHLOROTHIAZIDE 25 MG PO TABS
25.0000 mg | ORAL_TABLET | Freq: Every day | ORAL | 3 refills | Status: DC
  Filled 2023-03-06: qty 90, 90d supply, fill #0
  Filled 2023-06-03: qty 90, 90d supply, fill #1

## 2023-03-06 MED ORDER — INSULIN LISPRO 100 UNIT/ML IJ SOLN
10.0000 [IU] | Freq: Three times a day (TID) | INTRAMUSCULAR | 11 refills | Status: DC
  Filled 2023-03-06: qty 10, 28d supply, fill #0
  Filled 2023-04-30: qty 10, 28d supply, fill #1

## 2023-03-06 MED ORDER — LISINOPRIL 20 MG PO TABS
20.0000 mg | ORAL_TABLET | Freq: Every day | ORAL | 3 refills | Status: DC
  Filled 2023-03-06: qty 90, 90d supply, fill #0
  Filled 2023-06-03: qty 90, 90d supply, fill #1

## 2023-03-06 MED ORDER — TRULICITY 0.75 MG/0.5ML ~~LOC~~ SOAJ
0.7500 mg | SUBCUTANEOUS | 4 refills | Status: DC
  Filled 2023-03-06: qty 2, 28d supply, fill #0
  Filled 2023-04-30 (×2): qty 2, 28d supply, fill #1

## 2023-03-06 MED ORDER — TECHLITE PEN NEEDLES 32G X 4 MM MISC
6 refills | Status: DC
Start: 2023-03-06 — End: 2024-08-19
  Filled 2023-03-06: qty 100, 25d supply, fill #0
  Filled 2023-04-30 (×2): qty 100, 25d supply, fill #1

## 2023-03-06 MED ORDER — ATORVASTATIN CALCIUM 80 MG PO TABS
80.0000 mg | ORAL_TABLET | Freq: Every day | ORAL | 1 refills | Status: DC
  Filled 2023-03-06: qty 90, 90d supply, fill #0

## 2023-03-06 MED ORDER — FLUTICASONE PROPIONATE 50 MCG/ACT NA SUSP
2.0000 | Freq: Every day | NASAL | 6 refills | Status: DC
  Filled 2023-03-06: qty 16, 30d supply, fill #0
  Filled 2023-04-30 (×2): qty 16, 30d supply, fill #1

## 2023-03-06 NOTE — Telephone Encounter (Signed)
I spoke to patient's daughter, Lissa Hoard, while she was with the patient at his appointment today with Gwinda Passe, NP.  They had Spanish interpreter assist and the information is documented in Ms Randa Evens' encounter  today.  The patient is uninsured and has been in the [place] for 27 years.  He is not a [place] citizen. I provided them with the following resources for citizenship issues:  [employer] : (949)[phone] CWS: [phone] [facility] for [facility] [place]ians: [phone]  I also explained that he may not qualify for Medicaid but I can have a DSS Eligibility case worker call her and check his eligibility. Lissa Hoard was in agreement and she confirmed her best contact number.   I sent an email to the [facility] DSS [place] Eligibility case workers at [facility] requesting they contact Lissa Hoard.

## 2023-03-06 NOTE — Progress Notes (Signed)
Subjective:  Patient ID: Patrick [name], male    DOB: [birth year]  Age: 77 y.o. MRN: [id number]  CC: Diabetes   HPI Patrick [name] presents for follow-up of diabetes. [facility] [address] [phone] interpreter ) Patient does not check blood sugar at home Daughter sole provider Sonia  Compliant with meds - No Checking CBGs? No  Fasting avg -   Postprandial average -  Exercising regularly? - Yes Watching carbohydrate intake? - No Neuropathy ? - No Hypoglycemic events - No  - Recovers with :   Pertinent ROS:  Polyuria - No Polydipsia - No Vision problems - No  Medications as noted below. Taking them regularly without complication/adverse reaction being reported today.   History Patrick [name] has a past medical history of Diabetes mellitus without complication (HCC).   He has a past surgical history that includes I & D extremity (Left, 10/24/2021).   His Family history is unknown by patient.He reports that he has never smoked. He does not have any smokeless tobacco history on file. He reports current alcohol use. He reports that he does not use drugs.  Current Outpatient Medications on File Prior to Visit  Medication Sig Dispense Refill   Accu-Chek Softclix Lancets lancets Use up to four times daily as directed (Patient not taking: Reported on 03/06/2023) 100 each 0   acetaminophen (TYLENOL) 325 MG tablet Take 2 tablets (650 mg total) by mouth every 6 (six) hours as needed for mild pain (or Fever >/= 101). (Patient not taking: Reported on 02/07/2022)     atorvastatin (LIPITOR) 80 MG tablet Take 1 tablet (80 mg total) by mouth daily. (Patient not taking: Reported on 03/06/2023) 90 tablet 1   Blood Glucose Monitoring Suppl (BLOOD GLUCOSE MONITOR SYSTEM) w/Device KIT Use up to four times daily as directed (Patient not taking: Reported on 02/07/2022) 1 kit 0   empagliflozin (JARDIANCE) 25 MG TABS tablet Take 1 tablet (25 mg total) by mouth daily before breakfast. (Patient not taking: Reported on  03/06/2023) 90 tablet 1   fluticasone (FLONASE) 50 MCG/ACT nasal spray Use 2 sprays into both nostrils once daily. (Patient not taking: Reported on 03/06/2023) 16 g 6   glucose blood (ACCU-CHEK GUIDE) test strip Use up to four times daily as directed (Patient not taking: Reported on 03/06/2023) 100 each 0   hydrochlorothiazide (HYDRODIURIL) 25 MG tablet Take 1 tablet (25 mg total) by mouth daily. (Patient not taking: Reported on 03/06/2023) 90 tablet 3   insulin lispro (HUMALOG) 100 UNIT/ML injection Inject 0.1 mLs (10 Units total) into the skin 3 (three) times daily before meals. For blood sugars 0-150: 0 units, 151-200: 2 units, 201-250: 4 units, 251-300 6 units, 301-350 8 units, 351-400 10 units,> 400:12 units & call M.D. (Patient not taking: Reported on 03/06/2023) 10 mL 11   Insulin Pen Needle (TECHLITE PEN NEEDLES) 32G X 4 MM MISC use up to four times daily as directed (Patient not taking: Reported on 03/06/2023) 100 each 6   lisinopril (ZESTRIL) 20 MG tablet Take 1 tablet (20 mg total) by mouth daily. (Patient not taking: Reported on 03/06/2023) 90 tablet 3   loratadine (CLARITIN) 10 MG tablet Take 1 tablet (10 mg total) by mouth daily. (Patient not taking: Reported on 03/06/2023) 90 tablet 2   metFORMIN (GLUCOPHAGE-XR) 500 MG 24 hr tablet Take 1 tablet (500 mg total) by mouth 2 (two) times daily before a meal. (Patient not taking: Reported on 03/06/2023) 90 tablet 1   No current facility-administered medications on file  prior to visit.    ROS Comprehensive ROS Pertinent positive and negative noted in HPI    Objective:  Blood Pressure 139/80 (BP Location: Left Arm, Patient Position: Sitting, Cuff Size: Normal)   Pulse 90   Temperature 98.2 F (36.8 C) (Oral)   Respiration 16   Height 4\' 11"  (1.499 m)   Weight 141 lb (64 kg)   Oxygen Saturation 99%   Body Mass Index 28.48 kg/m   BP Readings from Last 3 Encounters:  03/06/23 139/80  12/05/22 (Abnormal) 155/87  09/04/22 (Abnormal) 181/92    Wt  Readings from Last 3 Encounters:  03/06/23 141 lb (64 kg)  12/05/22 137 lb 12.8 oz (62.5 kg)  09/04/22 143 lb 12.8 oz (65.2 kg)    Physical Exam  General: No apparent distress. Eyes: Extraocular eye movements intact, pupils equal and round. Neck: Supple, trachea midline. Thyroid: No enlargement, mobile without fixation, no tenderness. Cardiovascular: Regular rhythm and rate, no murmur, normal radial pulses. Respiratory: Normal respiratory effort, clear to auscultation. Gastrointestinal: Normal pitch active bowel sounds, nontender abdomen without distention or appreciable hepatomegaly. Musculoskeletal: Normal muscle tone, no tenderness on palpation of tibia, no excessive thoracic kyphosis. Skin: Appropriate warmth, no visible rash. Mental status: mental decline Hematologic/lymphatic: No cervical adenopathy, no visible ecchymoses.  Lab Results  Component Value Date   HGBA1C 8.9 (A) 03/06/2023   HGBA1C 7.5 (A) 12/05/2022   HGBA1C 7.0 09/04/2022    Lab Results  Component Value Date   WBC 6.6 09/04/2022   HGB 13.5 09/04/2022   HCT 41.7 09/04/2022   PLT 207 09/04/2022   GLUCOSE 171 (H) 09/04/2022   CHOL 236 (H) 09/04/2022   TRIG 179 (H) 09/04/2022   HDL 39 (L) 09/04/2022   LDLCALC 164 (H) 09/04/2022   ALT 14 09/04/2022   AST 22 09/04/2022   NA 138 09/04/2022   K 4.5 09/04/2022   CL 100 09/04/2022   CREATININE 0.87 09/04/2022   BUN 14 09/04/2022   CO2 21 09/04/2022   INR 1.1 10/19/2021   HGBA1C 8.9 (A) 03/06/2023     Assessment & Plan:   Patrick [name] was seen today for diabetes.  Diagnoses and all orders for this visit:  Uncontrolled type 2 diabetes mellitus with hyperglycemia (HCC) -     HgB A1c 8.9 Abnormal  7.5 Abnormal    Complications from uncontrolled diabetes -diabetic retinopathy leading to blindness, diabetic nephropathy leading to dialysis, decrease in circulation decrease in sores or wound healing which may lead to amputations and increase of heart attack and  stroke  -     empagliflozin (JARDIANCE) 25 MG TABS tablet; Take 1 tablet (25 mg total) by mouth daily before breakfast. -     metFORMIN (GLUCOPHAGE-XR) 500 MG 24 hr tablet; Take 1 tablet (500 mg total) by mouth 2 (two) times daily before a meal. -     Insulin Pen Needle (TECHLITE PEN NEEDLES) 32G X 4 MM MISC; use up to four times daily as directed  Non-compliant patient 2/2 Memory loss  Over weight Discussed diet and exercise for person with BMI >25. Instructed: You must burn more calories than you eat. Losing 5 percent of your body weight should be considered a success. In the longer term, losing more than 15 percent of your body weight and staying at this weight is an extremely good result. However, keep in mind that even losing 5 percent of your body weight leads to important health benefits, so try not to get discouraged if you're not able  to lose more than this. Will recheck weight in 3-6 months.    Onychocryptosis        Podiatry refer  Over weight Discussed diet and exercise for person with BMI >25. Instructed: You must burn more calories than you eat.   Onychocryptosis -     Ambulatory referral to Podiatry  Allergy, initial encounter -     fluticasone (FLONASE) 50 MCG/ACT nasal spray; Use 2 sprays into both nostrils once daily.  Essential hypertension BP goal - < 130/80 Explained that having normal blood pressure is the goal and medications are helping to get to goal and maintain normal blood pressure. DIET: Limit salt intake, read nutrition labels to check salt content, limit fried and high fatty foods  Avoid using multisymptom OTC cold preparations that generally contain sudafed which can rise BP. Consult with pharmacist on best cold relief products to use for persons with HTN EXERCISE Discussed incorporating exercise such as walking - 30 minutes most days of the week and can do in 10 minute intervals    -     hydrochlorothiazide (HYDRODIURIL) 25 MG tablet; Take 1 tablet  (25 mg total) by mouth daily. -     lisinopril (ZESTRIL) 20 MG tablet; Take 1 tablet (20 mg total) by mouth daily.  Colon cancer screening -     Fecal occult blood, imunochemical; Future -     Fecal occult blood, imunochemical  Need for prophylactic vaccination against Streptococcus pneumoniae (pneumococcus) -     Pneumococcal conjugate vaccine 20-valent (Prevnar 20)  Other orders -     atorvastatin (LIPITOR) 80 MG tablet; Take 1 tablet (80 mg total) by mouth daily. -     insulin lispro (HUMALOG) 100 UNIT/ML injection; Inject 0.1 mLs (10 Units total) into the skin 3 (three) times daily before meals. For blood sugars 0-150: 0 units, 151-200: 2 units, 201-250: 4 units, 251-300 6 units, 301-350 8 units, 351-400 10 units,> 400:12 units & call M.D. -     Tdap vaccine greater than or equal to 7yo IM -     Cancel: Pneumococcal polysaccharide vaccine 23-valent greater than or equal to 2yo subcutaneous/IM -     Dulaglutide (TRULICITY) 0.75 MG/0.5ML SOPN; Inject 0.75 mg into the skin once a week.    I am having Marquita Palms Oros Bradly [name] maintain his acetaminophen, Blood Glucose Monitor System, TechLite Pen Needles, loratadine, fluticasone, empagliflozin, insulin lispro, lisinopril, hydrochlorothiazide, atorvastatin, metFORMIN, Accu-Chek Guide, and Accu-Chek Softclix Lancets.  No orders of the defined types were placed in this encounter.    Follow-up:   No follow-ups on file.  The above assessment and management plan was discussed with the patient. The patient verbalized understanding of and has agreed to the management plan. Patient is aware to call the clinic if symptoms fail to improve or worsen. Patient is aware when to return to the clinic for a follow-up visit. Patient educated on when it is appropriate to go to the emergency department.   Gwinda Passe, NP-C

## 2023-03-06 NOTE — Progress Notes (Signed)
F/u DM  A1C- 8.9 Rosa [id number]

## 2023-03-06 NOTE — Patient Instructions (Addendum)
[employer] [employer] - [phone] CWS [phone] [facility] [place] [facility] [phone] PCP will sign waiver for citizenship

## 2023-03-07 ENCOUNTER — Other Ambulatory Visit: Payer: Self-pay

## 2023-03-10 ENCOUNTER — Other Ambulatory Visit: Payer: Self-pay

## 2023-03-18 ENCOUNTER — Ambulatory Visit: Payer: Self-pay | Admitting: Pharmacist

## 2023-03-28 ENCOUNTER — Other Ambulatory Visit: Payer: Self-pay

## 2023-04-07 ENCOUNTER — Other Ambulatory Visit: Payer: Self-pay

## 2023-04-23 ENCOUNTER — Other Ambulatory Visit: Payer: Self-pay

## 2023-04-28 ENCOUNTER — Other Ambulatory Visit: Payer: Self-pay

## 2023-04-29 ENCOUNTER — Other Ambulatory Visit: Payer: Self-pay

## 2023-04-30 ENCOUNTER — Other Ambulatory Visit: Payer: Self-pay

## 2023-05-02 ENCOUNTER — Other Ambulatory Visit: Payer: Self-pay

## 2023-05-02 ENCOUNTER — Encounter (HOSPITAL_COMMUNITY): Payer: Self-pay

## 2023-05-02 ENCOUNTER — Emergency Department (HOSPITAL_COMMUNITY): Payer: Self-pay

## 2023-05-02 ENCOUNTER — Emergency Department (HOSPITAL_COMMUNITY)
Admission: EM | Admit: 2023-05-02 | Discharge: 2023-05-02 | Disposition: A | Discharge status: Home / Self Care | Payer: Self-pay | Attending: Emergency Medicine | Admitting: Emergency Medicine

## 2023-05-02 DIAGNOSIS — L97529 Non-pressure chronic ulcer of other part of left foot with unspecified severity: Secondary | ICD-10-CM | POA: Insufficient documentation

## 2023-05-02 DIAGNOSIS — Z794 Long term (current) use of insulin: Secondary | ICD-10-CM | POA: Insufficient documentation

## 2023-05-02 DIAGNOSIS — Z7984 Long term (current) use of oral hypoglycemic drugs: Secondary | ICD-10-CM | POA: Insufficient documentation

## 2023-05-02 DIAGNOSIS — E11621 Type 2 diabetes mellitus with foot ulcer: Secondary | ICD-10-CM | POA: Insufficient documentation

## 2023-05-02 LAB — CBC WITH DIFFERENTIAL/PLATELET
Abs Immature Granulocytes: 0.01 10*3/uL (ref 0.00–0.07)
Basophils Absolute: 0 10*3/uL (ref 0.0–0.1)
Basophils Relative: 1 %
Eosinophils Absolute: 0.3 10*3/uL (ref 0.0–0.5)
Eosinophils Relative: 7 %
HCT: 36.3 % — ABNORMAL LOW (ref 39.0–52.0)
Hemoglobin: 11.6 g/dL — ABNORMAL LOW (ref 13.0–17.0)
Immature Granulocytes: 0 %
Lymphocytes Relative: 27 %
Lymphs Abs: 1.3 10*3/uL (ref 0.7–4.0)
MCH: 28 pg (ref 26.0–34.0)
MCHC: 32 g/dL (ref 30.0–36.0)
MCV: 87.5 fL (ref 80.0–100.0)
Monocytes Absolute: 0.5 10*3/uL (ref 0.1–1.0)
Monocytes Relative: 10 %
Neutro Abs: 2.7 10*3/uL (ref 1.7–7.7)
Neutrophils Relative %: 55 %
Platelets: 172 10*3/uL (ref 150–400)
RBC: 4.15 MIL/uL — ABNORMAL LOW (ref 4.22–5.81)
RDW: 13 % (ref 11.5–15.5)
WBC: 4.9 10*3/uL (ref 4.0–10.5)
nRBC: 0 % (ref 0.0–0.2)

## 2023-05-02 LAB — BASIC METABOLIC PANEL
Anion gap: 11 (ref 5–15)
BUN: 15 mg/dL (ref 8–23)
CO2: 22 mmol/L (ref 22–32)
Calcium: 8.9 mg/dL (ref 8.9–10.3)
Chloride: 99 mmol/L (ref 98–111)
Creatinine, Ser: 0.9 mg/dL (ref 0.61–1.24)
GFR, Estimated: >60 mL/min (ref >60)
Glucose, Bld: 241 mg/dL — ABNORMAL HIGH (ref 70–99)
Potassium: 3.6 mmol/L (ref 3.5–5.1)
Sodium: 132 mmol/L — ABNORMAL LOW (ref 135–145)

## 2023-05-02 MED ORDER — DOXYCYCLINE HYCLATE 100 MG PO TABS
100.0000 mg | ORAL_TABLET | Freq: Two times a day (BID) | ORAL | 0 refills | Status: DC
  Filled 2023-05-02: qty 20, 10d supply, fill #0

## 2023-05-02 NOTE — ED Provider Notes (Signed)
[place] EMERGENCY DEPARTMENT AT [facility] Provider Note   CSN: [id number] Arrival date & time: 05/02/23  1002     History Chief Complaint  Patient presents with   Diabetic Ulcer    Patrick [name] is a 77 y.o. male with history of diabetes presents emerged from today for evaluation of wound between his fourth and fifth toe on the left foot.  Wound is been present for the past month.  Family has tried an over-the-counter purple salve on the area without much effect.  No fevers.  No numbness or tingling into the feet.  Patient reports he still has good sensation in his feet.  Some pain.  No medication taken prior to arrival.  No significant change recently.  No known drug allergies.  HPI     Home Medications Prior to Admission medications   Medication Sig Start Date End Date Taking? Authorizing Provider  atorvastatin (LIPITOR) 80 MG tablet Take 1 tablet (80 mg total) by mouth daily. 03/06/23   Grayce Sessions, NP  Dulaglutide (TRULICITY) 0.75 MG/0.5ML SOPN Inject 0.75 mg into the skin once a week. 03/06/23   Grayce Sessions, NP  empagliflozin (JARDIANCE) 25 MG TABS tablet Take 1 tablet (25 mg total) by mouth daily before breakfast. 03/06/23   Grayce Sessions, NP  fluticasone (FLONASE) 50 MCG/ACT nasal spray Use 2 sprays into both nostrils once daily. 03/06/23   Grayce Sessions, NP  hydrochlorothiazide (HYDRODIURIL) 25 MG tablet Take 1 tablet (25 mg total) by mouth daily. 03/06/23   Grayce Sessions, NP  insulin lispro (HUMALOG) 100 UNIT/ML injection Inject 0.1 mLs (10 Units total) into the skin 3 (three) times daily before meals. For blood sugars 0-150: 0 units, 151-200: 2 units, 201-250: 4 units, 251-300 6 units, 301-350 8 units, 351-400 10 units,> 400:12 units & call M.D. 03/06/23   Grayce Sessions, NP  Insulin Pen Needle (TECHLITE PEN NEEDLES) 32G X 4 MM MISC use up to four times daily as directed 03/06/23   Grayce Sessions, NP  lisinopril (ZESTRIL) 20 MG  tablet Take 1 tablet (20 mg total) by mouth daily. 03/06/23   Grayce Sessions, NP  metFORMIN (GLUCOPHAGE-XR) 500 MG 24 hr tablet Take 1 tablet (500 mg total) by mouth 2 (two) times daily before a meal. 03/06/23   Grayce Sessions, NP      Allergies    Patient has no known allergies.    Review of Systems   Review of Systems  Constitutional:  Negative for chills and fever.  Skin:  Positive for wound.    Physical Exam Updated Vital Signs BP 134/86   Pulse 86   Temp 97.6 F (36.4 C) (Oral)   Resp 14   SpO2 99%  Physical Exam Vitals and nursing note reviewed.  Constitutional:      General: He is not in acute distress.    Appearance: Normal appearance. He is not toxic-appearing.  Eyes:     General: No scleral icterus. Pulmonary:     Effort: Pulmonary effort is normal. No respiratory distress.  Musculoskeletal:     Right lower leg: No edema.     Left lower leg: No edema.     Comments: Dopplerable pulse on the left foot.   Skin:    General: Skin is dry.     Findings: No rash.  Neurological:     General: No focal deficit present.     Mental Status: He is alert. Mental status is  at baseline.  Psychiatric:        Mood and Affect: Mood normal.     ED Results / Procedures / Treatments   Labs (all labs ordered are listed, but only abnormal results are displayed) Labs Reviewed  CBC WITH DIFFERENTIAL/PLATELET - Abnormal; Notable for the following components:      Result Value   RBC 4.15 (*)    Hemoglobin 11.6 (*)    HCT 36.3 (*)    All other components within normal limits  BASIC METABOLIC PANEL - Abnormal; Notable for the following components:   Sodium 132 (*)    Glucose, Bld 241 (*)    All other components within normal limits    EKG None  Radiology DG Foot Complete Left  Result Date: 05/02/2023 CLINICAL DATA:  Diabetic wounds on the fourth and fifth toes EXAM: LEFT FOOT - COMPLETE 3 VIEW COMPARISON:  None Available. FINDINGS: There is no evidence of fracture or  dislocation. Vascular calcifications. Soft tissue wounds are not well seen. Soft tissues are unremarkable. Rounded lucency projecting over the anterior ankle on lateral view, likely prominent fat planes. IMPRESSION: No acute osseous abnormality. Electronically Signed   By: Agustin Cree M.D.   On: 05/02/2023 11:24    Procedures Procedures  {Document cardiac monitor, telemetry assessment procedure when appropriate:1}  Medications Ordered in ED Medications - No data to display  ED Course/ Medical Decision Making/ A&P   {   Click here for ABCD2, HEART and other calculatorsREFRESH Note before signing :1}                          Medical Decision Making Amount and/or Complexity of Data Reviewed Labs: ordered. Radiology: ordered.   ***  {Document critical care time when appropriate:1} {Document review of labs and clinical decision tools ie heart score, Chads2Vasc2 etc:1}  {Document your independent review of radiology images, and any outside records:1} {Document your discussion with family members, caretakers, and with consultants:1} {Document social determinants of health affecting pt's care:1} {Document your decision making why or why not admission, treatments were needed:1} Final Clinical Impression(s) / ED Diagnoses Final diagnoses:  None    Rx / DC Orders ED Discharge Orders     None

## 2023-05-02 NOTE — ED Notes (Signed)
Patient transported to X-ray 

## 2023-05-02 NOTE — Discharge Instructions (Addendum)
You were seen in the ER today for evaluation of your foot wound. You will need to follow up with podiatry and and with wound care.  I am starting you on an antibiotic for you to take twice a day for the next 10 days.  Please make sure you are providing aggressive wound care and staying on top of keeping the wound covered and clean.  Someone should reach out to you to schedule an appointment with the wound care center however if you have not heard from them within the next few days, please call them to schedule an appointment.  Additionally, you will need to call the podiatrist schedule appointment as well.  If you have any worsening of the wound, swelling, drainage, or any fevers, please return to the nearest emergency department for evaluation.  If you have any concerns, new or worsening symptoms, please return to your nearest emergency department for evaluation.  Le atendieron hoy en urgencias para evaluar la herida de su pie. Deber realizar un seguimiento con podologa y cuidado de heridas.  Le voy a Corporate investment banker a Cytogeneticist al [employer] prximos [address].  Asegrese de brindar un cuidado agresivo a las heridas y de estar al tanto de Pharmacologist la herida [place] y limpia.  Alguien debera comunicarse con usted para programar una cita con el centro de atencin de heridas; sin embargo, si no ha tenido noticias suyas en los [address], llmelos para programar una cita.  Adems, tambin deber llamar al podlogo para programar la cita.  Si la herida empeora, se hincha, supura o tiene fiebre, regrese al departamento de emergencias ms cercano para una evaluacin.  Si tiene [place] inquietud, sntomas nuevos o que [place], regrese al departamento de emergencias ms cercano para una evaluacin.  Comunquese con un mdico si: Le aplicaron la vacuna antitetnica y tiene hinchazn, dolor intenso, enrojecimiento o hemorragia en el sitio de la inyeccin. El dolor no se alivia con los  [employer]. Tiene cualquiera de estos signos de infeccin: Ms enrojecimiento, hinchazn o dolor alrededor de la herida. Lquido o [employer] de la herida. Calor que proviene de la herida. Fiebre o escalofros. Siente nuseas o vomita. Tiene mareos. Tiene una erupcin cutnea o dureza nueva alrededor de la herida. Solicite ayuda de inmediato si: Tiene una lnea roja en la piel cerca de la zona que rodea la herida. Advierte pus o mal olor que proviene de la herida. La herida fue cerrada con grapas, suturas, goma para cerrar la piel o tiras [place] y estas comienzan a abrirse y a separarse. La herida sangra y el sangrado no se detiene con una presin [place]. Estos sntomas pueden representar un problema grave que constituye Radio broadcast assistant. No espere a ver si los sntomas desaparecen. Solicite atencin mdica de inmediato. Comunquese con el servicio de emergencias de su localidad (911 en los [place]). No conduzca por sus propios medios [employer].

## 2023-05-02 NOTE — ED Triage Notes (Signed)
Pt BIB POV from home after having left increased pain between the 4th and 5th digit of the left foot.   Hx DM

## 2023-05-06 ENCOUNTER — Ambulatory Visit (INDEPENDENT_AMBULATORY_CARE_PROVIDER_SITE_OTHER): Payer: Self-pay | Admitting: Primary Care

## 2023-05-06 ENCOUNTER — Other Ambulatory Visit: Payer: Self-pay

## 2023-05-06 ENCOUNTER — Encounter (INDEPENDENT_AMBULATORY_CARE_PROVIDER_SITE_OTHER): Payer: Self-pay | Admitting: Primary Care

## 2023-05-06 VITALS — BP 116/75 | HR 91 | Resp 16 | Wt 133.8 lb

## 2023-05-06 DIAGNOSIS — L600 Ingrowing nail: Secondary | ICD-10-CM

## 2023-05-06 MED ORDER — DOXYCYCLINE HYCLATE 100 MG PO TABS
100.0000 mg | ORAL_TABLET | Freq: Two times a day (BID) | ORAL | 0 refills | Status: DC
Start: 2023-05-06 — End: 2023-07-02
  Filled 2023-05-06 – 2023-05-07 (×2): qty 20, 10d supply, fill #0

## 2023-05-06 NOTE — Progress Notes (Signed)
[facility]   Subjective:   Patrick [name] is a 77 y.o. male presents for ED follow up.  Presented on 05/02/23, with painful pinky toe. Referrals were made daughter Byrd Hesselbach called podiatry and before he can be seen he needs to pay $250 co pay. Also referred to the wound bariatric center daughter has not called yet due to co payments. He is on abt but taking it daily instead bid.   Past Medical History:  Diagnosis Date   Diabetes mellitus without complication (HCC)      No Known Allergies    Current Outpatient Medications on File Prior to Visit  Medication Sig Dispense Refill   atorvastatin (LIPITOR) 80 MG tablet Take 1 tablet (80 mg total) by mouth daily. 90 tablet 1   doxycycline (VIBRA-TABS) 100 MG tablet Take 1 tablet (100 mg total) by mouth 2 (two) times daily. 20 tablet 0   Dulaglutide (TRULICITY) 0.75 MG/0.5ML SOPN Inject 0.75 mg into the skin once a week. 2 mL 4   empagliflozin (JARDIANCE) 25 MG TABS tablet Take 1 tablet (25 mg total) by mouth daily before breakfast. 90 tablet 1   fluticasone (FLONASE) 50 MCG/ACT nasal spray Use 2 sprays into both nostrils once daily. 16 g 6   hydrochlorothiazide (HYDRODIURIL) 25 MG tablet Take 1 tablet (25 mg total) by mouth daily. 90 tablet 3   insulin lispro (HUMALOG) 100 UNIT/ML injection Inject 0.1 mLs (10 Units total) into the skin 3 (three) times daily before meals. For blood sugars 0-150: 0 units, 151-200: 2 units, 201-250: 4 units, 251-300 6 units, 301-350 8 units, 351-400 10 units,> 400:12 units & call M.D. 10 mL 11   Insulin Pen Needle (TECHLITE PEN NEEDLES) 32G X 4 MM MISC use up to four times daily as directed 100 each 6   lisinopril (ZESTRIL) 20 MG tablet Take 1 tablet (20 mg total) by mouth daily. 90 tablet 3   metFORMIN (GLUCOPHAGE-XR) 500 MG 24 hr tablet Take 1 tablet (500 mg total) by mouth 2 (two) times daily before a meal. 90 tablet 1   No current facility-administered medications on file prior to visit.      Review of System: Comprehensive ROS Pertinent positive and negative noted in HPI    Objective:  Blood Pressure 116/75   Pulse 91   Respiration 16   Weight 133 lb 12.8 oz (60.7 kg)   Oxygen Saturation 96%   Body Mass Index 27.02 kg/m   Filed Weights   05/06/23 1535  Weight: 133 lb 12.8 oz (60.7 kg)   Physical Exam:  General l Appearance: Well nourished, in no apparent distress. Eyes: PERRLA, EOMs, conjunctiva no swelling or erythema Sinuses: No Frontal/maxillary tenderness ENT/Mouth: Ext aud canals clear, TMs without erythema, bulging. No erythema, swelling, or exudate on post pharynx.  Tonsils not swollen or erythematous. Hearing normal.  Neck: Supple, thyroid normal.  Respiratory: Respiratory effort normal, BS equal bilaterally without rales, rhonchi, wheezing or stridor.  Cardio: RRR with no MRGs. Brisk peripheral pulses without edema.  Abdomen: Soft, + BS.  Non tender, no guarding, rebound, hernias, masses. Lymphatics: Non tender without lymphadenopathy.  Musculoskeletal: Full ROM, 5/5 strength, normal gait.  Skin: Warm, dry without rashes, lesions, ecchymosis.  Neuro: Cranial nerves intact. Normal muscle tone, no cerebellar symptoms. Sensation intact.  Psych: Awake and oriented X 3, normal affect, Insight and Judgment appropriate.    Assessment:  Sagan was seen today for hospitalization follow-up.  Diagnoses and all orders for this visit:  Onychocryptosis     -  doxycycline (VIBRA-TABS) 100 MG tablet; Take 1 tablet (100 mg total) by mouth 2 (two) times daily.     This note has been created with Education officer, environmental. Any transcriptional errors are unintentional.   Grayce Sessions, NP 05/06/2023, 4:01 PM

## 2023-05-07 ENCOUNTER — Telehealth: Payer: Self-pay

## 2023-05-07 ENCOUNTER — Other Ambulatory Visit: Payer: Self-pay

## 2023-05-07 NOTE — Telephone Encounter (Signed)
Message sent to [facility] financial counselor requesting he contact the patient to assist with application for CAFA/ OC

## 2023-05-08 NOTE — Telephone Encounter (Signed)
I spoke to Jenene Slicker, Artist and he said he will contact the patient about CAFA/OC applications.

## 2023-05-12 ENCOUNTER — Ambulatory Visit: Payer: Self-pay | Admitting: Orthopedic Surgery

## 2023-05-12 ENCOUNTER — Encounter: Payer: Self-pay | Admitting: Orthopedic Surgery

## 2023-05-12 DIAGNOSIS — I96 Gangrene, not elsewhere classified: Secondary | ICD-10-CM

## 2023-05-12 DIAGNOSIS — L97521 Non-pressure chronic ulcer of other part of left foot limited to breakdown of skin: Secondary | ICD-10-CM

## 2023-05-12 NOTE — Progress Notes (Signed)
Office Visit Note   Patient: Patrick [name]           Date of Birth: 01-Feb-1946           MRN: [id number] Visit Date: 05/12/2023              Requested by: No referring provider defined for this encounter. PCP: Pcp, No  Chief Complaint  Patient presents with   Right Foot - Wound Check   Left Foot - Wound Check      HPI: Patient is a 77 year old gentleman who was seen for initial evaluation for ischemic ulcer fourth webspace PIP joint fourth toe.  Patient has a history of uncontrolled diabetes.  Patient states that his brother lost both feet to diabetes.  Most recent hemoglobin A1c 8.9.  Assessment & Plan: Visit Diagnoses:  1. Non-pressure chronic ulcer of other part of left foot limited to breakdown of skin (HCC)   2. Gangrene of toe of left foot (HCC)     Plan: Will make a consult with vascular vein surgery for ankle-brachial indices to see if patient has revascularization options.  Will follow-up after his vascular vein surgery consult.  Follow-Up Instructions: Return in about 4 weeks (around 06/09/2023).   Ortho Exam  Patient is alert, oriented, no adenopathy, well-dressed, normal affect, normal respiratory effort. Examination patient has a faintly palpable dorsalis pedis pulse with the Doppler he has monophasic dorsalis pedis and monophasic anterior tibial pulse.  He has an ulcer in the fourth webspace PIP joint of the fourth toe.  This probes to bone.  The ulcer is 5 mm in diameter.  There are no black gangrenous changes.  His forefoot is cold to the touch.  Patient is currently on doxycycline.  Imaging: No results found. No images are attached to the encounter.  Labs: Lab Results  Component Value Date   HGBA1C 8.9 (A) 03/06/2023   HGBA1C 7.5 (A) 12/05/2022   HGBA1C 7.0 09/04/2022   ESRSEDRATE 115 (H) 10/23/2021   CRP 15.1 (H) 10/23/2021   REPTSTATUS 10/29/2021 FINAL 10/24/2021   REPTSTATUS 10/29/2021 FINAL 10/24/2021   GRAMSTAIN  10/24/2021    MODERATE  WBC PRESENT,BOTH PMN AND MONONUCLEAR FEW GRAM POSITIVE COCCI    GRAMSTAIN  10/24/2021    RARE WBC PRESENT, PREDOMINANTLY PMN RARE GRAM POSITIVE COCCI IN CLUSTERS    CULT  10/24/2021    FEW STAPHYLOCOCCUS AUREUS NO ANAEROBES ISOLATED Performed at [facility], [address] [place], [place] [address]    CULT  10/24/2021    MODERATE STAPHYLOCOCCUS AUREUS NO ANAEROBES ISOLATED Performed at [facility], [address] [place], [place] [address]    [facility] STAPHYLOCOCCUS AUREUS 10/24/2021   LABORGA STAPHYLOCOCCUS AUREUS 10/24/2021     Lab Results  Component Value Date   ALBUMIN 5.0 (H) 09/04/2022   ALBUMIN 2.5 (L) 10/25/2021   ALBUMIN 3.4 (L) 03/22/2014    Lab Results  Component Value Date   MG 2.0 10/26/2021   MG 2.3 10/25/2021   No results found for: "VD25OH"  No results found for: "PREALBUMIN"    Latest Ref Rng & Units 05/02/2023   11:15 AM 09/04/2022   10:29 AM 10/26/2021    2:43 AM  CBC EXTENDED  WBC 4.0 - 10.5 K/uL 4.9  6.6  7.5   RBC 4.22 - 5.81 MIL/uL 4.15  5.00  4.18   Hemoglobin 13.0 - 17.0 g/dL 21.3  08.6  57.8   HCT 39.0 - 52.0 % 36.3  41.7  35.4   Platelets 150 - 400 K/uL 172  207  195   NEUT# 1.7 - 7.7 K/uL 2.7  3.8    Lymph# 0.7 - 4.0 K/uL 1.3  2.0       There is no height or weight on file to calculate BMI.  Orders:  Orders Placed This Encounter  Procedures   Ambulatory referral to Vascular Surgery   No orders of the defined types were placed in this encounter.    Procedures: No procedures performed  Clinical Data: No additional findings.  ROS:  All other systems negative, except as noted in the HPI. Review of Systems  Objective: Vital Signs: There were no vitals taken for this visit.  Specialty Comments:  No specialty comments available.  PMFS History: Patient Active Problem List   Diagnosis Date Noted   Alcohol intoxication with delirium (HCC) 09/04/2022   Prepatellar bursitis, left knee 11/13/2021    Infection of left knee (HCC) 10/24/2021   Cellulitis of knee 10/23/2021   Uncontrolled type 2 diabetes mellitus with hyperglycemia (HCC) 10/23/2021   Past Medical History:  Diagnosis Date   Diabetes mellitus without complication (HCC)     Family History  Family history unknown: Yes    Past Surgical History:  Procedure Laterality Date   I & D EXTREMITY Left 10/24/2021   Procedure: IRRIGATION AND DEBRIDEMENT OF KNEE AND EXCISION PRE-PATELLA BURSA;  Surgeon: Ernestina Columbia, MD;  Location: [facility] OR;  Service: Orthopedics;  Laterality: Left;   Social History   Occupational History   Not on file  Tobacco Use   Smoking status: Never   Smokeless tobacco: Not on file  Vaping Use   Vaping status: Never Used  Substance and Sexual Activity   Alcohol use: Yes   Drug use: No   Sexual activity: Not on file

## 2023-05-15 ENCOUNTER — Ambulatory Visit (HOSPITAL_COMMUNITY)
Admission: RE | Admit: 2023-05-15 | Discharge: 2023-05-15 | Disposition: A | Discharge status: Home / Self Care | Payer: Self-pay | Source: Ambulatory Visit | Attending: Vascular Surgery | Admitting: Vascular Surgery

## 2023-05-15 ENCOUNTER — Other Ambulatory Visit: Payer: Self-pay | Admitting: *Deleted

## 2023-05-15 ENCOUNTER — Other Ambulatory Visit: Payer: Self-pay

## 2023-05-15 ENCOUNTER — Encounter (INDEPENDENT_AMBULATORY_CARE_PROVIDER_SITE_OTHER): Payer: Self-pay | Admitting: Primary Care

## 2023-05-15 ENCOUNTER — Ambulatory Visit (INDEPENDENT_AMBULATORY_CARE_PROVIDER_SITE_OTHER): Payer: Self-pay | Admitting: Surgery

## 2023-05-15 ENCOUNTER — Encounter: Payer: Self-pay | Admitting: Surgery

## 2023-05-15 VITALS — BP 139/85 | HR 76 | Temp 97.7°F | Resp 16 | Ht 61.5 in | Wt 130.0 lb

## 2023-05-15 DIAGNOSIS — M79605 Pain in left leg: Secondary | ICD-10-CM | POA: Insufficient documentation

## 2023-05-15 DIAGNOSIS — M79604 Pain in right leg: Secondary | ICD-10-CM | POA: Insufficient documentation

## 2023-05-15 DIAGNOSIS — I7025 Atherosclerosis of native arteries of other extremities with ulceration: Secondary | ICD-10-CM

## 2023-05-15 LAB — VAS US ABI WITH/WO TBI: Left ABI: 0.95

## 2023-05-15 NOTE — Progress Notes (Signed)
[facility] [place]  Patient name: Patrick [name] MRN: [id number] DOB: [birth year] Sex: male   REQUESTING PROVIDER:   Dr. Lajoyce Corners   REASON FOR CONSULT:    PAD with ulceration  HISTORY OF PRESENT ILLNESS:   Patrick [name] is a 77 y.o. male, who saw Dr. Lajoyce Corners on 05/12/2023 for an initial evaluation of an ischemic ulcer in his left fourth toe webspace.  He is referred for vascular evaluation.  He has also some pain in the foot.  Patient has a history of diabetes.  He is a non-smoker.  He takes a statin for hypercholesterolemia.  PAST MEDICAL HISTORY    Past Medical History:  Diagnosis Date   Diabetes mellitus without complication (HCC)      FAMILY HISTORY   Family History  Family history unknown: Yes    SOCIAL HISTORY:   Social History   Socioeconomic History   Marital status: Single    Spouse name: Not on file   Number of children: Not on file   Years of education: Not on file   Highest education level: Not on file  Occupational History   Not on file  Tobacco Use   Smoking status: Never   Smokeless tobacco: Not on file  Vaping Use   Vaping status: Never Used  Substance and Sexual Activity   Alcohol use: Yes   Drug use: No   Sexual activity: Not on file  Other Topics Concern   Not on file  Social History Narrative   Not on file   Social Determinants of Health   Financial Resource Strain: Not on file  Food Insecurity: Not on file  Transportation Needs: Not on file  Physical Activity: Not on file  Stress: Not on file  Social Connections: Not on file  Intimate Partner Violence: Not on file    ALLERGIES:    No Known Allergies  CURRENT MEDICATIONS:    Current Outpatient Medications  Medication Sig Dispense Refill   atorvastatin (LIPITOR) 80 MG tablet Take 1 tablet (80 mg total) by mouth daily. 90 tablet 1   doxycycline (VIBRA-TABS) 100 MG tablet Take 1 tablet (100 mg total) by mouth  2 (two) times daily. 20 tablet 0   Dulaglutide (TRULICITY) 0.75 MG/0.5ML SOPN Inject 0.75 mg into the skin once a week. 2 mL 4   empagliflozin (JARDIANCE) 25 MG TABS tablet Take 1 tablet (25 mg total) by mouth daily before breakfast. 90 tablet 1   hydrochlorothiazide (HYDRODIURIL) 25 MG tablet Take 1 tablet (25 mg total) by mouth daily. 90 tablet 3   insulin lispro (HUMALOG) 100 UNIT/ML injection Inject 0.1 mLs (10 Units total) into the skin 3 (three) times daily before meals. For blood sugars 0-150: 0 units, 151-200: 2 units, 201-250: 4 units, 251-300 6 units, 301-350 8 units, 351-400 10 units,> 400:12 units & call M.D. 10 mL 11   Insulin Pen Needle (TECHLITE PEN NEEDLES) 32G X 4 MM MISC use up to four times daily as directed 100 each 6   lisinopril (ZESTRIL) 20 MG tablet Take 1 tablet (20 mg total) by mouth daily. 90 tablet 3   metFORMIN (GLUCOPHAGE-XR) 500 MG 24 hr tablet Take 1 tablet (500 mg total) by mouth 2 (two) times daily before a meal. 90 tablet 1   fluticasone (FLONASE) 50 MCG/ACT nasal spray Use 2 sprays into both nostrils once daily. (Patient not taking: Reported on 05/15/2023) 16 g 6   No current facility-administered medications for this visit.  REVIEW OF SYSTEMS:   [X]  denotes positive finding, [ ]  denotes negative finding Cardiac  Comments:  Chest pain or chest pressure:    Shortness of breath upon exertion:    Short of breath when lying flat:    Irregular heart rhythm:        Vascular    Pain in calf, thigh, or hip brought on by ambulation:    Pain in feet at night that wakes you up from your sleep:  x   Blood clot in your veins:    Leg swelling:         Pulmonary    Oxygen at home:    Productive cough:     Wheezing:         Neurologic    Sudden weakness in arms or legs:     Sudden numbness in arms or legs:     Sudden onset of difficulty speaking or slurred speech:    Temporary loss of vision in one eye:     Problems with dizziness:         Gastrointestinal     Blood in stool:      Vomited blood:         Genitourinary    Burning when urinating:     Blood in urine:        Psychiatric    Major depression:         Hematologic    Bleeding problems:    Problems with blood clotting too easily:        Skin    Rashes or ulcers:        Constitutional    Fever or chills:     PHYSICAL EXAM:   Vitals:   05/15/23 0918  BP: 139/85  Pulse: 76  Resp: 16  Temp: 97.7 F (36.5 C)  TempSrc: Temporal  SpO2: 99%  Weight: 130 lb (59 kg)  Height: 5' 1.5" (1.562 m)    GENERAL: The patient is a well-nourished male, in no acute distress. The vital signs are documented above. CARDIAC: There is a regular rate and rhythm.  VASCULAR: Palpable femoral pulses.  I could not palpate popliteal or pedal pulses PULMONARY: Nonlabored respirations ABDOMEN: Soft and non-tender MUSCULOSKELETAL: There are no major deformities or cyanosis. NEUROLOGIC: No focal weakness or paresthesias are detected. SKIN: See photo below PSYCHIATRIC: The patient has a normal affect.  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  I could not palpate a popliteal or pedal pulse.  I suspect he has superficial femoral, popliteal, and/or tibial disease.  He is at risk for limb loss and so he needs to undergo angiography to evaluate his blood flow and intervene if necessary.  If he has  SFA or popliteal  disease, this is best treated with atherectomy and stenting as these have superior results to primary angioplasty.  Tibial disease can be treated with atherectomy and/or angioplasty.  I did discuss with the assistance of the Spanish interpreter that he is at risk for limb loss without revascularization and even with a good result until his wounds are healed.  I told them that I am out of town for 2 weeks and so this will be addressed by one of my partners next week.  They understand and wish to proceed.  He will need to be started on aspirin and statin, and possibly Plavix if intervention is able to be performed.  We also discussed that he may require surgical revascularization.  All questions were answered.  This will be scheduled in the immediate future.   Charlena Cross, MD, FACS [facility] [phone] Pager [phone]

## 2023-05-15 NOTE — H&P (View-Only) (Signed)
[facility] [place]  Patient name: Patrick [name] MRN: [id number] DOB: [birth year] Sex: male   REQUESTING PROVIDER:   Dr. Lajoyce Corners   REASON FOR CONSULT:    PAD with ulceration  HISTORY OF PRESENT ILLNESS:   Patrick [name] is a 77 y.o. male, who saw Dr. Lajoyce Corners on 05/12/2023 for an initial evaluation of an ischemic ulcer in his left fourth toe webspace.  He is referred for vascular evaluation.  He has also some pain in the foot.  Patient has a history of diabetes.  He is a non-smoker.  He takes a statin for hypercholesterolemia.  PAST MEDICAL HISTORY    Past Medical History:  Diagnosis Date   Diabetes mellitus without complication (HCC)      FAMILY HISTORY   Family History  Family history unknown: Yes    SOCIAL HISTORY:   Social History   Socioeconomic History   Marital status: Single    Spouse name: Not on file   Number of children: Not on file   Years of education: Not on file   Highest education level: Not on file  Occupational History   Not on file  Tobacco Use   Smoking status: Never   Smokeless tobacco: Not on file  Vaping Use   Vaping status: Never Used  Substance and Sexual Activity   Alcohol use: Yes   Drug use: No   Sexual activity: Not on file  Other Topics Concern   Not on file  Social History Narrative   Not on file   Social Determinants of Health   Financial Resource Strain: Not on file  Food Insecurity: Not on file  Transportation Needs: Not on file  Physical Activity: Not on file  Stress: Not on file  Social Connections: Not on file  Intimate Partner Violence: Not on file    ALLERGIES:    No Known Allergies  CURRENT MEDICATIONS:    Current Outpatient Medications  Medication Sig Dispense Refill   atorvastatin (LIPITOR) 80 MG tablet Take 1 tablet (80 mg total) by mouth daily. 90 tablet 1   doxycycline (VIBRA-TABS) 100 MG tablet Take 1 tablet (100 mg total) by mouth  2 (two) times daily. 20 tablet 0   Dulaglutide (TRULICITY) 0.75 MG/0.5ML SOPN Inject 0.75 mg into the skin once a week. 2 mL 4   empagliflozin (JARDIANCE) 25 MG TABS tablet Take 1 tablet (25 mg total) by mouth daily before breakfast. 90 tablet 1   hydrochlorothiazide (HYDRODIURIL) 25 MG tablet Take 1 tablet (25 mg total) by mouth daily. 90 tablet 3   insulin lispro (HUMALOG) 100 UNIT/ML injection Inject 0.1 mLs (10 Units total) into the skin 3 (three) times daily before meals. For blood sugars 0-150: 0 units, 151-200: 2 units, 201-250: 4 units, 251-300 6 units, 301-350 8 units, 351-400 10 units,> 400:12 units & call M.D. 10 mL 11   Insulin Pen Needle (TECHLITE PEN NEEDLES) 32G X 4 MM MISC use up to four times daily as directed 100 each 6   lisinopril (ZESTRIL) 20 MG tablet Take 1 tablet (20 mg total) by mouth daily. 90 tablet 3   metFORMIN (GLUCOPHAGE-XR) 500 MG 24 hr tablet Take 1 tablet (500 mg total) by mouth 2 (two) times daily before a meal. 90 tablet 1   fluticasone (FLONASE) 50 MCG/ACT nasal spray Use 2 sprays into both nostrils once daily. (Patient not taking: Reported on 05/15/2023) 16 g 6   No current facility-administered medications for this visit.  REVIEW OF SYSTEMS:   [X]  denotes positive finding, [ ]  denotes negative finding Cardiac  Comments:  Chest pain or chest pressure:    Shortness of breath upon exertion:    Short of breath when lying flat:    Irregular heart rhythm:        Vascular    Pain in calf, thigh, or hip brought on by ambulation:    Pain in feet at night that wakes you up from your sleep:  x   Blood clot in your veins:    Leg swelling:         Pulmonary    Oxygen at home:    Productive cough:     Wheezing:         Neurologic    Sudden weakness in arms or legs:     Sudden numbness in arms or legs:     Sudden onset of difficulty speaking or slurred speech:    Temporary loss of vision in one eye:     Problems with dizziness:         Gastrointestinal     Blood in stool:      Vomited blood:         Genitourinary    Burning when urinating:     Blood in urine:        Psychiatric    Major depression:         Hematologic    Bleeding problems:    Problems with blood clotting too easily:        Skin    Rashes or ulcers:        Constitutional    Fever or chills:     PHYSICAL EXAM:   Vitals:   05/15/23 0918  BP: 139/85  Pulse: 76  Resp: 16  Temp: 97.7 F (36.5 C)  TempSrc: Temporal  SpO2: 99%  Weight: 130 lb (59 kg)  Height: 5' 1.5" (1.562 m)    GENERAL: The patient is a well-nourished male, in no acute distress. The vital signs are documented above. CARDIAC: There is a regular rate and rhythm.  VASCULAR: Palpable femoral pulses.  I could not palpate popliteal or pedal pulses PULMONARY: Nonlabored respirations ABDOMEN: Soft and non-tender MUSCULOSKELETAL: There are no major deformities or cyanosis. NEUROLOGIC: No focal weakness or paresthesias are detected. SKIN: See photo below PSYCHIATRIC: The patient has a normal affect.  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  I could not palpate a popliteal or pedal pulse.  I suspect he has superficial femoral, popliteal, and/or tibial disease.  He is at risk for limb loss and so he needs to undergo angiography to evaluate his blood flow and intervene if necessary.  If he has  SFA or popliteal  disease, this is best treated with atherectomy and stenting as these have superior results to primary angioplasty.  Tibial disease can be treated with atherectomy and/or angioplasty.  I did discuss with the assistance of the Spanish interpreter that he is at risk for limb loss without revascularization and even with a good result until his wounds are healed.  I told them that I am out of town for 2 weeks and so this will be addressed by one of my partners next week.  They understand and wish to proceed.  He will need to be started on aspirin and statin, and possibly Plavix if intervention is able to be performed.  We also discussed that he may require surgical revascularization.  All questions were answered.  This will be scheduled in the immediate future.   Charlena Cross, MD, FACS [facility] [phone] Pager [phone]

## 2023-05-16 ENCOUNTER — Other Ambulatory Visit (HOSPITAL_COMMUNITY): Payer: Self-pay

## 2023-05-16 ENCOUNTER — Encounter (HOSPITAL_COMMUNITY)
Admission: RE | Disposition: A | Discharge status: Home / Self Care | Payer: Self-pay | Source: Home / Self Care | Attending: Vascular Surgery

## 2023-05-16 ENCOUNTER — Other Ambulatory Visit: Payer: Self-pay

## 2023-05-16 ENCOUNTER — Ambulatory Visit (HOSPITAL_COMMUNITY)
Admission: RE | Admit: 2023-05-16 | Discharge: 2023-05-16 | Disposition: A | Discharge status: Home / Self Care | Payer: Self-pay | Attending: Vascular Surgery | Admitting: Vascular Surgery

## 2023-05-16 DIAGNOSIS — E11621 Type 2 diabetes mellitus with foot ulcer: Secondary | ICD-10-CM | POA: Insufficient documentation

## 2023-05-16 DIAGNOSIS — I7025 Atherosclerosis of native arteries of other extremities with ulceration: Secondary | ICD-10-CM

## 2023-05-16 DIAGNOSIS — E7800 Pure hypercholesterolemia, unspecified: Secondary | ICD-10-CM | POA: Insufficient documentation

## 2023-05-16 DIAGNOSIS — L97529 Non-pressure chronic ulcer of other part of left foot with unspecified severity: Secondary | ICD-10-CM | POA: Insufficient documentation

## 2023-05-16 DIAGNOSIS — I70245 Atherosclerosis of native arteries of left leg with ulceration of other part of foot: Secondary | ICD-10-CM | POA: Insufficient documentation

## 2023-05-16 DIAGNOSIS — E1151 Type 2 diabetes mellitus with diabetic peripheral angiopathy without gangrene: Secondary | ICD-10-CM | POA: Insufficient documentation

## 2023-05-16 DIAGNOSIS — Z7985 Long-term (current) use of injectable non-insulin antidiabetic drugs: Secondary | ICD-10-CM | POA: Insufficient documentation

## 2023-05-16 DIAGNOSIS — Z7984 Long term (current) use of oral hypoglycemic drugs: Secondary | ICD-10-CM | POA: Insufficient documentation

## 2023-05-16 DIAGNOSIS — Z794 Long term (current) use of insulin: Secondary | ICD-10-CM | POA: Insufficient documentation

## 2023-05-16 HISTORY — PX: ABDOMINAL AORTOGRAM W/LOWER EXTREMITY: CATH118223

## 2023-05-16 HISTORY — PX: PERIPHERAL VASCULAR BALLOON ANGIOPLASTY: CATH118281

## 2023-05-16 LAB — POCT I-STAT, CHEM 8
BUN: 16 mg/dL (ref 8–23)
Calcium, Ion: 1.14 mmol/L — ABNORMAL LOW (ref 1.15–1.40)
Chloride: 94 mmol/L — ABNORMAL LOW (ref 98–111)
Creatinine, Ser: 0.6 mg/dL — ABNORMAL LOW (ref 0.61–1.24)
Glucose, Bld: 98 mg/dL (ref 70–99)
HCT: 37 % — ABNORMAL LOW (ref 39.0–52.0)
Hemoglobin: 12.6 g/dL — ABNORMAL LOW (ref 13.0–17.0)
Potassium: 4.1 mmol/L (ref 3.5–5.1)
Sodium: 131 mmol/L — ABNORMAL LOW (ref 135–145)
TCO2: 25 mmol/L (ref 22–32)

## 2023-05-16 LAB — GLUCOSE, CAPILLARY
Glucose-Capillary: 98 mg/dL (ref 70–99)
Glucose-Capillary: 81 mg/dL (ref 70–99)

## 2023-05-16 SURGERY — ABDOMINAL AORTOGRAM W/LOWER EXTREMITY
Anesthesia: LOCAL | Laterality: Left

## 2023-05-16 MED ORDER — CLOPIDOGREL BISULFATE 75 MG PO TABS
75.0000 mg | ORAL_TABLET | Freq: Every day | ORAL | 11 refills | Status: AC
  Filled 2023-05-16: qty 30, 30d supply, fill #0
  Filled 2023-06-17: qty 90, 90d supply, fill #1
  Filled 2023-06-17: qty 30, 30d supply, fill #1
  Filled 2023-10-10: qty 90, 90d supply, fill #2
  Filled 2024-01-14: qty 90, 90d supply, fill #3

## 2023-05-16 MED ORDER — HEPARIN SODIUM (PORCINE) 1000 UNIT/ML IJ SOLN
INTRAMUSCULAR | Status: DC | PRN
  Administered 2023-05-16: 6000 [IU] via INTRAVENOUS

## 2023-05-16 MED ORDER — LABETALOL HCL 5 MG/ML IV SOLN: 10.0000 mg | INTRAVENOUS | Status: DC | PRN

## 2023-05-16 MED ORDER — CLOPIDOGREL BISULFATE 300 MG PO TABS
ORAL_TABLET | ORAL | Status: DC | PRN
  Administered 2023-05-16: 300 mg via ORAL

## 2023-05-16 MED ORDER — SODIUM CHLORIDE 0.9% FLUSH: 3.0000 mL | Freq: Two times a day (BID) | INTRAVENOUS | Status: DC

## 2023-05-16 MED ORDER — CLOPIDOGREL BISULFATE 75 MG PO TABS
75.0000 mg | ORAL_TABLET | Freq: Every day | ORAL | 11 refills | Status: DC
  Filled 2023-05-16: qty 30, 30d supply, fill #0

## 2023-05-16 MED ORDER — ASPIRIN 81 MG PO TBEC
81.0000 mg | DELAYED_RELEASE_TABLET | Freq: Every day | ORAL | 2 refills | Status: DC
  Filled 2023-05-16: qty 120, 120d supply, fill #0

## 2023-05-16 MED ORDER — ASPIRIN 81 MG PO TBEC: 81.0000 mg | DELAYED_RELEASE_TABLET | Freq: Every day | ORAL | Status: DC

## 2023-05-16 MED ORDER — SODIUM CHLORIDE 0.9 % IV SOLN: 250.0000 mL | INTRAVENOUS | Status: DC | PRN

## 2023-05-16 MED ORDER — ASPIRIN 81 MG PO TBEC
81.0000 mg | DELAYED_RELEASE_TABLET | Freq: Every day | ORAL | 2 refills | Status: DC
  Filled 2023-05-16: qty 120, 120d supply, fill #0
  Filled 2023-10-10: qty 120, 120d supply, fill #1
  Filled 2024-02-18: qty 120, 120d supply, fill #2

## 2023-05-16 MED ORDER — ONDANSETRON HCL 4 MG/2ML IJ SOLN: 4.0000 mg | Freq: Four times a day (QID) | INTRAMUSCULAR | Status: DC | PRN

## 2023-05-16 MED ORDER — MIDAZOLAM HCL 2 MG/2ML IJ SOLN
INTRAMUSCULAR | Status: DC | PRN
  Administered 2023-05-16: 1 mg via INTRAVENOUS

## 2023-05-16 MED ORDER — IODIXANOL 320 MG/ML IV SOLN
INTRAVENOUS | Status: DC | PRN
  Administered 2023-05-16: 70 mL

## 2023-05-16 MED ORDER — HEPARIN (PORCINE) IN NACL 1000-0.9 UT/500ML-% IV SOLN
INTRAVENOUS | Status: DC | PRN
  Administered 2023-05-16 (×2): 500 mL

## 2023-05-16 MED ORDER — LIDOCAINE HCL (PF) 1 % IJ SOLN
INTRAMUSCULAR | Status: DC | PRN
  Administered 2023-05-16: 20 mL

## 2023-05-16 MED ORDER — SODIUM CHLORIDE 0.9% FLUSH: 3.0000 mL | INTRAVENOUS | Status: DC | PRN

## 2023-05-16 MED ORDER — ATORVASTATIN CALCIUM 40 MG PO TABS
40.0000 mg | ORAL_TABLET | Freq: Every day | ORAL | Status: DC
  Administered 2023-05-16: 40 mg via ORAL
  Filled 2023-05-16 (×2): qty 1

## 2023-05-16 MED ORDER — HYDRALAZINE HCL 20 MG/ML IJ SOLN: 5.0000 mg | INTRAMUSCULAR | Status: DC | PRN

## 2023-05-16 MED ORDER — SODIUM CHLORIDE 0.9 % IV SOLN: INTRAVENOUS | Status: DC

## 2023-05-16 MED ORDER — FENTANYL CITRATE (PF) 100 MCG/2ML IJ SOLN
INTRAMUSCULAR | Status: DC | PRN
  Administered 2023-05-16: 50 ug via INTRAVENOUS

## 2023-05-16 MED ORDER — SODIUM CHLORIDE 0.9 % WEIGHT BASED INFUSION: 1.0000 mL/kg/h | INTRAVENOUS | Status: DC

## 2023-05-16 MED ORDER — CLOPIDOGREL BISULFATE 75 MG PO TABS: 75.0000 mg | ORAL_TABLET | Freq: Every day | ORAL | Status: DC

## 2023-05-16 MED ORDER — ASPIRIN 81 MG PO CHEW
CHEWABLE_TABLET | ORAL | Status: DC | PRN
  Administered 2023-05-16: 81 mg via ORAL

## 2023-05-16 MED ORDER — PROTAMINE SULFATE 10 MG/ML IV SOLN
INTRAVENOUS | Status: DC | PRN
  Administered 2023-05-16: 20 mg via INTRAVENOUS
  Administered 2023-05-16: 5 mg via INTRAVENOUS

## 2023-05-16 MED ORDER — CLOPIDOGREL BISULFATE 75 MG PO TABS: 300.0000 mg | ORAL_TABLET | Freq: Once | ORAL | Status: DC

## 2023-05-16 MED ORDER — ACETAMINOPHEN 325 MG PO TABS: 650.0000 mg | ORAL_TABLET | ORAL | Status: DC | PRN

## 2023-05-16 SURGICAL SUPPLY — 14 items
BALLN STERLING OTW 3X220X150 (BALLOONS) ×1
BALLOON STERLING OTW 3X220X150 (BALLOONS) IMPLANT
CATH OMNI FLUSH 5F 65CM (CATHETERS) IMPLANT
CATH TEMPO AQUA 5F 100CM (CATHETERS) IMPLANT
GLIDEWIRE ADV .035X260CM (WIRE) IMPLANT
KIT ENCORE 26 ADVANTAGE (KITS) IMPLANT
KIT MICROPUNCTURE NIT STIFF (SHEATH) IMPLANT
KIT SINGLE USE MANIFOLD (KITS) IMPLANT
SET ATX-X65L (MISCELLANEOUS) IMPLANT
SHEATH CATAPULT 5FR 90 (SHEATH) IMPLANT
SHEATH PINNACLE 5F 10CM (SHEATH) IMPLANT
TRAY PV CATH (CUSTOM PROCEDURE TRAY) ×1 IMPLANT
WIRE BENTSON .035X145CM (WIRE) IMPLANT
WIRE G V18X300CM (WIRE) IMPLANT

## 2023-05-16 NOTE — Progress Notes (Signed)
Pts pharmacy closed, called Dr Lenell Antu, he will send to community pharmacy, our [facility] pharm closed also. Family to pick up med.

## 2023-05-16 NOTE — Op Note (Signed)
DATE OF SERVICE: 05/16/2023  PATIENT:  Patrick [name]  77 y.o. male  PRE-OPERATIVE DIAGNOSIS:  Atherosclerosis of native arteries of left lower extremity causing ulceration  POST-OPERATIVE DIAGNOSIS:  Same  PROCEDURE:   1) Ultrasound guided right common femoral artery access 2) Aortogram 3) Left lower extremity angiogram with third order cannulation  4) Additional left lower extremity angiogram with third order cannulation 5) Left anterior tibial artery angioplasty (3x216mm Sterling) 6) Left peroneal artery angioplasty (3x267mm Sterling) 7) Conscious sedation (35 minutes)   SURGEON:  Rande Brunt. Lenell Antu, MD  ASSISTANT: none  ANESTHESIA:   local and IV sedation  ESTIMATED BLOOD LOSS: minimal  LOCAL MEDICATIONS USED:  LIDOCAINE   COUNTS: confirmed correct.  PATIENT DISPOSITION:  PACU - hemodynamically stable.   Delay start of Pharmacological VTE agent (>24hrs) due to surgical blood loss or risk of bleeding: no  INDICATION FOR PROCEDURE: Patrick [name] is a 77 y.o. male with left foot diabetic ulceration. Non-invasive testing worrisome for significant peripheral arterial disease. After careful discussion of risks, benefits, and alternatives the patient was offered angiography. The patient understood and wished to proceed.  OPERATIVE FINDINGS:  Terminal aorta and iliac arteries: patent  Left lower extremity: Common femoral artery: patent  Profunda femoris artery: patent  Superficial femoral artery: patent Popliteal artery: patent Anterior tibial artery: patent proximally; multifocal chronic total occlusion; distal reconstitution about the ankle Tibioperoneal trunk: patent Peroneal artery: patent proximally; multifocal chronic total occlusion; distal reconstitution about the ankle Posterior tibial artery: occluded Pedal circulation: severely disadvantaged  GLASS score. FP 0. IP 3. Stage II.  WIfI score. 2 / 3 / 1. Stage IV.   DESCRIPTION OF PROCEDURE: After  identification of the patient in the pre-operative holding area, the patient was transferred to the operating room. The patient was positioned supine on the operating room table. Anesthesia was induced. The groins was prepped and draped in standard fashion. A surgical pause was performed confirming correct patient, procedure, and operative location.  The right groin was anesthetized with subcutaneous injection of 1% lidocaine. Using ultrasound guidance, the right common femoral artery was accessed with micropuncture technique. Fluoroscopy was used to confirm cannulation over the femoral head. The 39F sheath was upsized to 59F.   A Benson wire was advanced into the distal aorta. Over the wire an omni flush catheter was advanced to the level of L2. Aortogram was performed - see above for details.   The left common iliac artery was selected with an omniflush catheter and glidewire advantage guidewire. The wire was advanced into the common femoral artery. Over the wire the omni flush catheter was advanced into the external iliac artery. Selective angiography was performed - see above for details.   The decision was made to intervene. The patient was heparinized with 7,000 units of heparin. The 59F sheath was exchanged for a 59F x 90cm sheath. Selective angiography of the left lower extremity was performed prior to intervention.   The lesions were treated with: Left anterior tibial artery angioplasty (3x216mm Sterling) Left peroneal artery angioplasty (3x235mm Sterling)  Completion angiography revealed:  Recanalization of both peroneal and anterior tibial arteries Persistent severe pedal disease  Manual pressure was used to close the arteriotomy.   Conscious sedation was administered with the use of IV fentanyl and midazolam under continuous physician and nurse monitoring.  Heart rate, blood pressure, and oxygen saturation were continuously monitored.  Total sedation time was 35 minutes  Upon completion  of the case instrument and sharps counts were  confirmed correct. The patient was transferred to the  PACU in good condition. I was present for all portions of the procedure.  PLAN: ASA / Plavix / Statin. Follow up with me in 4 weeks with ABI / LLE duplex.   Rande Brunt. Lenell Antu, MD [facility] Phone Number: [phone] 05/16/2023 2:30 PM

## 2023-05-16 NOTE — Interval H&P Note (Signed)
History and Physical Interval Note:  05/16/2023 2:42 PM  Patrick [name] Oros Bradly Bienenstock  has presented today for surgery, with the diagnosis of Atherosclerosis of native arteries of the extremities with ulceration.  The various methods of treatment have been discussed with the patient and family. After consideration of risks, benefits and other options for treatment, the patient has consented to  Procedure(s) with comments: ABDOMINAL AORTOGRAM W/LOWER EXTREMITY (Left) PERIPHERAL VASCULAR BALLOON ANGIOPLASTY (Left) - left AT and left peroneal as a surgical intervention.  The patient's history has been reviewed, patient examined, no change in status, stable for surgery.  I have reviewed the patient's chart and labs.  Questions were answered to the patient's satisfaction.     Leonie Douglas

## 2023-05-19 ENCOUNTER — Encounter (HOSPITAL_COMMUNITY): Payer: Self-pay | Admitting: Vascular Surgery

## 2023-05-19 LAB — POCT ACTIVATED CLOTTING TIME: Activated Clotting Time: 232 seconds

## 2023-05-21 ENCOUNTER — Telehealth: Payer: Self-pay | Admitting: Orthopedic Surgery

## 2023-05-21 ENCOUNTER — Encounter: Payer: Self-pay | Admitting: Orthopedic Surgery

## 2023-05-21 NOTE — Telephone Encounter (Signed)
8/5 is okay, thank you

## 2023-05-21 NOTE — Telephone Encounter (Signed)
Pt just had surgery with Dr. Juanetta Gosling on feet was told to follow up with Lajoyce Corners Pt is in need of pain medication please advise follow up is being set now. Please send Rx to [facility]

## 2023-05-21 NOTE — Telephone Encounter (Signed)
Pt had surgery on 07/19 With Dr. Juanetta Gosling and is supposed to follow up with Lajoyce Corners I was trying to schedule the follow up but no avail appts until 08/05 please advise

## 2023-05-21 NOTE — Telephone Encounter (Signed)
Pt has already sent in a mychart message about pain med request. This has been sent to Dr. Lajoyce Corners but he is doing surgery all day today and will reply once a has a chance.

## 2023-05-22 ENCOUNTER — Other Ambulatory Visit: Payer: Self-pay

## 2023-05-22 ENCOUNTER — Encounter: Payer: Self-pay | Admitting: Orthopedic Surgery

## 2023-05-22 ENCOUNTER — Ambulatory Visit (INDEPENDENT_AMBULATORY_CARE_PROVIDER_SITE_OTHER): Payer: Self-pay | Admitting: Orthopedic Surgery

## 2023-05-22 DIAGNOSIS — I739 Peripheral vascular disease, unspecified: Secondary | ICD-10-CM

## 2023-05-22 DIAGNOSIS — I96 Gangrene, not elsewhere classified: Secondary | ICD-10-CM

## 2023-05-22 DIAGNOSIS — L97521 Non-pressure chronic ulcer of other part of left foot limited to breakdown of skin: Secondary | ICD-10-CM

## 2023-05-22 MED ORDER — NITROGLYCERIN 0.2 MG/HR TD PT24
0.2000 mg | MEDICATED_PATCH | Freq: Every day | TRANSDERMAL | 12 refills | Status: DC
Start: 2023-05-22 — End: 2023-08-03
  Filled 2023-05-22: qty 30, 30d supply, fill #0
  Filled 2023-06-17 (×2): qty 30, 30d supply, fill #1

## 2023-05-22 NOTE — Progress Notes (Signed)
Office Visit Note   Patient: Patrick [name]           Date of Birth: 28-Mar-1946           MRN: [id number] Visit Date: 05/22/2023              Requested by: No referring provider defined for this encounter. PCP: Pcp, No  Chief Complaint  Patient presents with   Right Foot - Wound Check   Left Foot - Wound Check      HPI: Patient is a 77 year old gentleman who is seen for evaluation ulceration fourth webspace fourth toe left foot.  Patient is status post revascularization with vascular surgery on July 19.  Patient states he has pain over the dorsum of the forefoot.  Assessment & Plan: Visit Diagnoses:  1. PAD (peripheral artery disease) (HCC)   2. Non-pressure chronic ulcer of other part of left foot limited to breakdown of skin (HCC)   3. Gangrene of toe of left foot (HCC)     Plan: A prescription was called in for nitroglycerin patch he will use this in line with the second toe and change daily the location.  Will reevaluate in 1 week.  Follow-Up Instructions: Return in about 1 week (around 05/29/2023).   Ortho Exam  Patient is alert, oriented, no adenopathy, well-dressed, normal affect, normal respiratory effort. Examination patient has good hair growth on the tibia.  I cannot palpate a dorsalis pedis or posterior tibial pulse.  There is a ischemic ulcer in the fourth webspace lateral border of the fourth toe.  There is no cellulitis there is no exposed bone or tendon.  No plantar ulcers.  Imaging: No results found. No images are attached to the encounter.  Labs: Lab Results  Component Value Date   HGBA1C 8.9 (A) 03/06/2023   HGBA1C 7.5 (A) 12/05/2022   HGBA1C 7.0 09/04/2022   ESRSEDRATE 115 (H) 10/23/2021   CRP 15.1 (H) 10/23/2021   REPTSTATUS 10/29/2021 FINAL 10/24/2021   REPTSTATUS 10/29/2021 FINAL 10/24/2021   GRAMSTAIN  10/24/2021    MODERATE WBC PRESENT,BOTH PMN AND MONONUCLEAR FEW GRAM POSITIVE COCCI    GRAMSTAIN  10/24/2021    RARE WBC PRESENT,  PREDOMINANTLY PMN RARE GRAM POSITIVE COCCI IN CLUSTERS    CULT  10/24/2021    FEW STAPHYLOCOCCUS AUREUS NO ANAEROBES ISOLATED Performed at [facility], [address], [place], [place] [address]    CULT  10/24/2021    MODERATE STAPHYLOCOCCUS AUREUS NO ANAEROBES ISOLATED Performed at [facility], [address], [place], [place] [address]    [facility] STAPHYLOCOCCUS AUREUS 10/24/2021   LABORGA STAPHYLOCOCCUS AUREUS 10/24/2021     Lab Results  Component Value Date   ALBUMIN 5.0 (H) 09/04/2022   ALBUMIN 2.5 (L) 10/25/2021   ALBUMIN 3.4 (L) 03/22/2014    Lab Results  Component Value Date   MG 2.0 10/26/2021   MG 2.3 10/25/2021   No results found for: "VD25OH"  No results found for: "PREALBUMIN"    Latest Ref Rng & Units 05/16/2023   11:54 AM 05/02/2023   11:15 AM 09/04/2022   10:29 AM  CBC EXTENDED  WBC 4.0 - 10.5 K/uL  4.9  6.6   RBC 4.22 - 5.81 MIL/uL  4.15  5.00   Hemoglobin 13.0 - 17.0 g/dL 75.6  43.3  29.5   HCT 39.0 - 52.0 % 37.0  36.3  41.7   Platelets 150 - 400 K/uL  172  207   NEUT#  1.7 - 7.7 K/uL  2.7  3.8   Lymph# 0.7 - 4.0 K/uL  1.3  2.0      There is no height or weight on file to calculate BMI.  Orders:  No orders of the defined types were placed in this encounter.  Meds ordered this encounter  Medications   nitroGLYCERIN (NITRODUR - DOSED IN MG/24 HR) 0.2 mg/hr patch    Sig: Place 1 patch (0.2 mg total) onto the skin daily.    Dispense:  30 patch    Refill:  12     Procedures: No procedures performed  Clinical Data: No additional findings.  ROS:  All other systems negative, except as noted in the HPI. Review of Systems  Objective: Vital Signs: There were no vitals taken for this visit.  Specialty Comments:  No specialty comments available.  PMFS History: Patient Active Problem List   Diagnosis Date Noted   Alcohol intoxication with delirium (HCC) 09/04/2022   Prepatellar bursitis, left knee 11/13/2021   Infection  of left knee (HCC) 10/24/2021   Cellulitis of knee 10/23/2021   Uncontrolled type 2 diabetes mellitus with hyperglycemia (HCC) 10/23/2021   Past Medical History:  Diagnosis Date   Diabetes mellitus without complication (HCC)     Family History  Family history unknown: Yes    Past Surgical History:  Procedure Laterality Date   ABDOMINAL AORTOGRAM W/LOWER EXTREMITY Left 05/16/2023   Procedure: ABDOMINAL AORTOGRAM W/LOWER EXTREMITY;  Surgeon: Leonie Douglas, MD;  [facility];  Service: Cardiovascular;  Laterality: Left;   I & D EXTREMITY Left 10/24/2021   Procedure: IRRIGATION AND DEBRIDEMENT OF KNEE AND EXCISION PRE-PATELLA BURSA;  Surgeon: Ernestina Columbia, MD;  [facility] OR;  Service: Orthopedics;  Laterality: Left;   PERIPHERAL VASCULAR BALLOON ANGIOPLASTY Left 05/16/2023   Procedure: PERIPHERAL VASCULAR BALLOON ANGIOPLASTY;  Surgeon: Leonie Douglas, MD;  [facility];  Service: Cardiovascular;  Laterality: Left;  left AT and left peroneal   Social History   Occupational History   Not on file  Tobacco Use   Smoking status: Never   Smokeless tobacco: Not on file  Vaping Use   Vaping status: Never Used  Substance and Sexual Activity   Alcohol use: Yes   Drug use: No   Sexual activity: Not on file

## 2023-06-02 ENCOUNTER — Ambulatory Visit (INDEPENDENT_AMBULATORY_CARE_PROVIDER_SITE_OTHER): Payer: Self-pay | Admitting: Orthopedic Surgery

## 2023-06-02 ENCOUNTER — Other Ambulatory Visit: Payer: Self-pay

## 2023-06-02 DIAGNOSIS — L97521 Non-pressure chronic ulcer of other part of left foot limited to breakdown of skin: Secondary | ICD-10-CM

## 2023-06-02 DIAGNOSIS — I739 Peripheral vascular disease, unspecified: Secondary | ICD-10-CM

## 2023-06-02 DIAGNOSIS — I96 Gangrene, not elsewhere classified: Secondary | ICD-10-CM

## 2023-06-02 MED ORDER — HYDROCODONE-ACETAMINOPHEN 5-325 MG PO TABS
1.0000 | ORAL_TABLET | Freq: Four times a day (QID) | ORAL | 0 refills | Status: DC | PRN
  Filled 2023-06-02: qty 30, 8d supply, fill #0

## 2023-06-03 ENCOUNTER — Encounter: Payer: Self-pay | Admitting: Orthopedic Surgery

## 2023-06-03 ENCOUNTER — Other Ambulatory Visit: Payer: Self-pay

## 2023-06-03 NOTE — Progress Notes (Signed)
Office Visit Note   Patient: Patrick [name]           Date of Birth: 1946/08/27           MRN: [id number] Visit Date: 06/02/2023              Requested by: No referring provider defined for this encounter. PCP: Pcp, No  Chief Complaint  Patient presents with   Left Foot - Wound Check      HPI: Patient is a 77 year old gentleman is seen in follow-up for left foot ischemic ulcer of fourth toe and the fourth webspace.  Patient states he has persistent pain.  Patient has recently undergone endovascular intervention with Dr. Lenell Antu.  Assessment & Plan: Visit Diagnoses:  1. PAD (peripheral artery disease) (HCC)   2. Non-pressure chronic ulcer of other part of left foot limited to breakdown of skin (HCC)   3. Gangrene of toe of left foot (HCC)     Plan: Continue with dry dressing changes and using the nitroglycerin patch.  Follow-Up Instructions: Return in about 2 weeks (around 06/16/2023).   Ortho Exam  Patient is alert, oriented, no adenopathy, well-dressed, normal affect, normal respiratory effort. Examination of the left foot the ulcer is flat no exposed bone or tendon there is no sausage digit swelling of the toe.  No ascending cellulitis.  Imaging: No results found. No images are attached to the encounter.  Labs: Lab Results  Component Value Date   HGBA1C 8.9 (A) 03/06/2023   HGBA1C 7.5 (A) 12/05/2022   HGBA1C 7.0 09/04/2022   ESRSEDRATE 115 (H) 10/23/2021   CRP 15.1 (H) 10/23/2021   REPTSTATUS 10/29/2021 FINAL 10/24/2021   REPTSTATUS 10/29/2021 FINAL 10/24/2021   GRAMSTAIN  10/24/2021    MODERATE WBC PRESENT,BOTH PMN AND MONONUCLEAR FEW GRAM POSITIVE COCCI    GRAMSTAIN  10/24/2021    RARE WBC PRESENT, PREDOMINANTLY PMN RARE GRAM POSITIVE COCCI IN CLUSTERS    CULT  10/24/2021    FEW STAPHYLOCOCCUS AUREUS NO ANAEROBES ISOLATED Performed at [facility], [address] [place], [place] [address]    CULT  10/24/2021    MODERATE STAPHYLOCOCCUS  AUREUS NO ANAEROBES ISOLATED Performed at [facility], [address] [place], [place] [address]    [facility] STAPHYLOCOCCUS AUREUS 10/24/2021   LABORGA STAPHYLOCOCCUS AUREUS 10/24/2021     Lab Results  Component Value Date   ALBUMIN 5.0 (H) 09/04/2022   ALBUMIN 2.5 (L) 10/25/2021   ALBUMIN 3.4 (L) 03/22/2014    Lab Results  Component Value Date   MG 2.0 10/26/2021   MG 2.3 10/25/2021   No results found for: "VD25OH"  No results found for: "PREALBUMIN"    Latest Ref Rng & Units 05/16/2023   11:54 AM 05/02/2023   11:15 AM 09/04/2022   10:29 AM  CBC EXTENDED  WBC 4.0 - 10.5 K/uL  4.9  6.6   RBC 4.22 - 5.81 MIL/uL  4.15  5.00   Hemoglobin 13.0 - 17.0 g/dL 82.9  56.2  13.0   HCT 39.0 - 52.0 % 37.0  36.3  41.7   Platelets 150 - 400 K/uL  172  207   NEUT# 1.7 - 7.7 K/uL  2.7  3.8   Lymph# 0.7 - 4.0 K/uL  1.3  2.0      There is no height or weight on file to calculate BMI.  Orders:  No orders of the defined types were placed in this encounter.  Meds ordered this encounter  Medications   HYDROcodone-acetaminophen (NORCO/VICODIN) 5-325 MG tablet    Sig: Take 1 tablet by mouth every 6 (six) hours as needed for severe pain.    Dispense:  30 tablet    Refill:  0     Procedures: No procedures performed  Clinical Data: No additional findings.  ROS:  All other systems negative, except as noted in the HPI. Review of Systems  Objective: Vital Signs: There were no vitals taken for this visit.  Specialty Comments:  No specialty comments available.  PMFS History: Patient Active Problem List   Diagnosis Date Noted   Alcohol intoxication with delirium (HCC) 09/04/2022   Prepatellar bursitis, left knee 11/13/2021   Infection of left knee (HCC) 10/24/2021   Cellulitis of knee 10/23/2021   Uncontrolled type 2 diabetes mellitus with hyperglycemia (HCC) 10/23/2021   Past Medical History:  Diagnosis Date   Diabetes mellitus without complication (HCC)      Family History  Family history unknown: Yes    Past Surgical History:  Procedure Laterality Date   ABDOMINAL AORTOGRAM W/LOWER EXTREMITY Left 05/16/2023   Procedure: ABDOMINAL AORTOGRAM W/LOWER EXTREMITY;  Surgeon: Leonie Douglas, MD;  [facility];  Service: Cardiovascular;  Laterality: Left;   I & D EXTREMITY Left 10/24/2021   Procedure: IRRIGATION AND DEBRIDEMENT OF KNEE AND EXCISION PRE-PATELLA BURSA;  Surgeon: Ernestina Columbia, MD;  Location: [facility] OR;  Service: Orthopedics;  Laterality: Left;   PERIPHERAL VASCULAR BALLOON ANGIOPLASTY Left 05/16/2023   Procedure: PERIPHERAL VASCULAR BALLOON ANGIOPLASTY;  Surgeon: Leonie Douglas, MD;  [facility];  Service: Cardiovascular;  Laterality: Left;  left AT and left peroneal   Social History   Occupational History   Not on file  Tobacco Use   Smoking status: Never   Smokeless tobacco: Not on file  Vaping Use   Vaping status: Never Used  Substance and Sexual Activity   Alcohol use: Yes   Drug use: No   Sexual activity: Not on file

## 2023-06-04 ENCOUNTER — Other Ambulatory Visit: Payer: Self-pay

## 2023-06-10 ENCOUNTER — Ambulatory Visit (INDEPENDENT_AMBULATORY_CARE_PROVIDER_SITE_OTHER): Payer: Self-pay | Admitting: Primary Care

## 2023-06-16 ENCOUNTER — Ambulatory Visit: Payer: Self-pay | Admitting: Orthopedic Surgery

## 2023-06-16 ENCOUNTER — Other Ambulatory Visit: Payer: Self-pay

## 2023-06-16 DIAGNOSIS — I96 Gangrene, not elsewhere classified: Secondary | ICD-10-CM

## 2023-06-16 DIAGNOSIS — I739 Peripheral vascular disease, unspecified: Secondary | ICD-10-CM

## 2023-06-16 MED ORDER — HYDROCODONE-ACETAMINOPHEN 5-325 MG PO TABS
1.0000 | ORAL_TABLET | Freq: Three times a day (TID) | ORAL | 0 refills | Status: DC | PRN
  Filled 2023-06-16: qty 30, 10d supply, fill #0

## 2023-06-17 ENCOUNTER — Encounter: Payer: Self-pay | Admitting: Orthopedic Surgery

## 2023-06-17 ENCOUNTER — Other Ambulatory Visit (HOSPITAL_COMMUNITY): Payer: Self-pay

## 2023-06-17 ENCOUNTER — Other Ambulatory Visit: Payer: Self-pay

## 2023-06-17 NOTE — Progress Notes (Signed)
Office Visit Note   Patient: Patrick [name]           Date of Birth: 1945-12-25           MRN: [id number] Visit Date: 06/16/2023              Requested by: No referring provider defined for this encounter. PCP: Pcp, No  Chief Complaint  Patient presents with   Left Foot - Follow-up      HPI: Patient is a 77 year old gentleman who is seen in follow-up for ischemic ulcer fourth webspace between the fourth and fifth toes.  Patient has completed his course of doxycycline.  He is using the nitroglycerin patch to improve the microcirculation.  Patient complains of increased ischemic pain.  Patient is status post endovascular reconstruction of the left lower extremity July 19.  Assessment & Plan: Visit Diagnoses:  1. PAD (peripheral artery disease) (HCC)   2. Gangrene of toe of left foot (HCC)     Plan.  Patient states he would like to continue with conservative treatment at this time with use the nitroglycerin patch and wound care.  If patient has persistent ischemic pain he would like to consider an amputation.  Follow-Up Instructions: Return in about 4 weeks (around 07/14/2023).   Ortho Exam  Patient is alert, oriented, no adenopathy, well-dressed, normal affect, normal respiratory effort. Examination the ulcer in the fourth webspace is stable the foot is warm patient has persistent increased pain.  Refill for Vicodin was provided.  Patient states he would like to give this some time and then would prefer to proceed with amputation as opposed to the persistent pain he is havi  Imaging: No results found. No images are attached to the encounter.  Labs: Lab Results  Component Value Date   HGBA1C 8.9 (A) 03/06/2023   HGBA1C 7.5 (A) 12/05/2022   HGBA1C 7.0 09/04/2022   ESRSEDRATE 115 (H) 10/23/2021   CRP 15.1 (H) 10/23/2021   REPTSTATUS 10/29/2021 FINAL 10/24/2021   REPTSTATUS 10/29/2021 FINAL 10/24/2021   GRAMSTAIN  10/24/2021    MODERATE WBC PRESENT,BOTH PMN AND  MONONUCLEAR FEW GRAM POSITIVE COCCI    GRAMSTAIN  10/24/2021    RARE WBC PRESENT, PREDOMINANTLY PMN RARE GRAM POSITIVE COCCI IN CLUSTERS    CULT  10/24/2021    FEW STAPHYLOCOCCUS AUREUS NO ANAEROBES ISOLATED Performed at [facility], [address] [place], [place] [address]    CULT  10/24/2021    MODERATE STAPHYLOCOCCUS AUREUS NO ANAEROBES ISOLATED Performed at [facility], [address] [place], [place] [address]    [facility] STAPHYLOCOCCUS AUREUS 10/24/2021   LABORGA STAPHYLOCOCCUS AUREUS 10/24/2021     Lab Results  Component Value Date   ALBUMIN 5.0 (H) 09/04/2022   ALBUMIN 2.5 (L) 10/25/2021   ALBUMIN 3.4 (L) 03/22/2014    Lab Results  Component Value Date   MG 2.0 10/26/2021   MG 2.3 10/25/2021   No results found for: "VD25OH"  No results found for: "PREALBUMIN"    Latest Ref Rng & Units 05/16/2023   11:54 AM 05/02/2023   11:15 AM 09/04/2022   10:29 AM  CBC EXTENDED  WBC 4.0 - 10.5 K/uL  4.9  6.6   RBC 4.22 - 5.81 MIL/uL  4.15  5.00   Hemoglobin 13.0 - 17.0 g/dL 82.9  56.2  13.0   HCT 39.0 - 52.0 % 37.0  36.3  41.7   Platelets 150 - 400 K/uL  172  207  NEUT# 1.7 - 7.7 K/uL  2.7  3.8   Lymph# 0.7 - 4.0 K/uL  1.3  2.0      There is no height or weight on file to calculate BMI.  Orders:  No orders of the defined types were placed in this encounter.  Meds ordered this encounter  Medications   HYDROcodone-acetaminophen (NORCO/VICODIN) 5-325 MG tablet    Sig: Take 1 tablet by mouth every 8 (eight) hours as needed for severe pain.    Dispense:  30 tablet    Refill:  0     Procedures: No procedures performed  Clinical Data: No additional findings.  ROS:  All other systems negative, except as noted in the HPI. Review of Systems  Objective: Vital Signs: There were no vitals taken for this visit.  Specialty Comments:  No specialty comments available.  PMFS History: Patient Active Problem List   Diagnosis Date Noted   Alcohol  intoxication with delirium (HCC) 09/04/2022   Prepatellar bursitis, left knee 11/13/2021   Infection of left knee (HCC) 10/24/2021   Cellulitis of knee 10/23/2021   Uncontrolled type 2 diabetes mellitus with hyperglycemia (HCC) 10/23/2021   Past Medical History:  Diagnosis Date   Diabetes mellitus without complication (HCC)     Family History  Family history unknown: Yes    Past Surgical History:  Procedure Laterality Date   ABDOMINAL AORTOGRAM W/LOWER EXTREMITY Left 05/16/2023   Procedure: ABDOMINAL AORTOGRAM W/LOWER EXTREMITY;  Surgeon: Leonie Douglas, MD;  [facility];  Service: Cardiovascular;  Laterality: Left;   I & D EXTREMITY Left 10/24/2021   Procedure: IRRIGATION AND DEBRIDEMENT OF KNEE AND EXCISION PRE-PATELLA BURSA;  Surgeon: Ernestina Columbia, MD;  Location: [facility] OR;  Service: Orthopedics;  Laterality: Left;   PERIPHERAL VASCULAR BALLOON ANGIOPLASTY Left 05/16/2023   Procedure: PERIPHERAL VASCULAR BALLOON ANGIOPLASTY;  Surgeon: Leonie Douglas, MD;  [facility];  Service: Cardiovascular;  Laterality: Left;  left AT and left peroneal   Social History   Occupational History   Not on file  Tobacco Use   Smoking status: Never   Smokeless tobacco: Not on file  Vaping Use   Vaping status: Never Used  Substance and Sexual Activity   Alcohol use: Yes   Drug use: No   Sexual activity: Not on file

## 2023-06-18 ENCOUNTER — Other Ambulatory Visit: Payer: Self-pay

## 2023-06-25 ENCOUNTER — Encounter: Payer: Self-pay | Admitting: Orthopedic Surgery

## 2023-06-25 ENCOUNTER — Ambulatory Visit: Payer: Self-pay | Admitting: Family

## 2023-06-25 ENCOUNTER — Encounter: Payer: Self-pay | Admitting: Family

## 2023-06-25 DIAGNOSIS — I739 Peripheral vascular disease, unspecified: Secondary | ICD-10-CM

## 2023-06-25 DIAGNOSIS — I96 Gangrene, not elsewhere classified: Secondary | ICD-10-CM

## 2023-06-25 NOTE — Progress Notes (Signed)
Office Visit Note   Patient: Patrick [name]           Date of Birth: 1946-07-27           MRN: [id number] Visit Date: 06/25/2023              Requested by: No referring provider defined for this encounter. PCP: Pcp, No  Chief Complaint  Patient presents with   Left Foot - Follow-up      HPI: The patient is a 77 year old gentleman who is seen today as a work in for worsening ischemic pain to the fourth toe.  They have noticed darkened discoloration.  He notes some coolness.  He is currently using a nitroglycerin patch to improve his microcirculation.  He is also status post endovascular reconstruction on the left July 19 of this year.  Assessment & Plan: Visit Diagnoses: No diagnosis found.  Plan: The patient is ready to plan for amputation of the fourth toe left foot.  We will post the case.  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   Ortho Exam  Patient is alert, oriented, no adenopathy, well-dressed, normal affect, normal respiratory effort. On examination of the left foot the fourth webspace ulcer is stable there is no exposed bone there is maceration in the webspace the toe has darkened discoloration.  There is a palpable dorsalis pedis pulse  Imaging: No results found. No images are attached to the encounter.  Labs: Lab Results  Component Value Date   HGBA1C 8.9 (A) 03/06/2023   HGBA1C 7.5 (A) 12/05/2022   HGBA1C 7.0 09/04/2022   ESRSEDRATE 115 (H) 10/23/2021   CRP 15.1 (H) 10/23/2021   REPTSTATUS 10/29/2021 FINAL 10/24/2021   REPTSTATUS 10/29/2021 FINAL 10/24/2021   GRAMSTAIN  10/24/2021    MODERATE WBC PRESENT,BOTH PMN AND MONONUCLEAR FEW GRAM POSITIVE COCCI    GRAMSTAIN  10/24/2021    RARE WBC PRESENT, PREDOMINANTLY PMN RARE GRAM POSITIVE COCCI IN CLUSTERS    CULT  10/24/2021    FEW STAPHYLOCOCCUS AUREUS NO  ANAEROBES ISOLATED Performed at [facility], [address] [place], [place] [address]    CULT  10/24/2021    MODERATE STAPHYLOCOCCUS AUREUS NO ANAEROBES ISOLATED Performed at [facility], [address], [place], [place] [address]    [facility] STAPHYLOCOCCUS AUREUS 10/24/2021   LABORGA STAPHYLOCOCCUS AUREUS 10/24/2021     Lab Results  Component Value Date   ALBUMIN 5.0 (H) 09/04/2022   ALBUMIN 2.5 (L) 10/25/2021   ALBUMIN 3.4 (L) 03/22/2014    Lab Results  Component Value Date   MG 2.0 10/26/2021   MG 2.3 10/25/2021   No results found for: "VD25OH"  No results found for: "PREALBUMIN"    Latest Ref Rng & Units 05/16/2023   11:54 AM 05/02/2023   11:15 AM 09/04/2022   10:29 AM  CBC EXTENDED  WBC 4.0 - 10.5 K/uL  4.9  6.6   RBC 4.22 - 5.81 MIL/uL  4.15  5.00   Hemoglobin 13.0 - 17.0 g/dL 95.2  84.1  32.4   HCT 39.0 - 52.0 % 37.0  36.3  41.7   Platelets 150 - 400 K/uL  172  207   NEUT# 1.7 - 7.7 K/uL  2.7  3.8   Lymph# 0.7 - 4.0 K/uL  1.3  2.0      There is no height  or weight on file to calculate BMI.  Orders:  No orders of the defined types were placed in this encounter.  No orders of the defined types were placed in this encounter.    Procedures: No procedures performed  Clinical Data: No additional findings.  ROS:  All other systems negative, except as noted in the HPI. Review of Systems  Objective: Vital Signs: There were no vitals taken for this visit.  Specialty Comments:  No specialty comments available.  PMFS History: Patient Active Problem List   Diagnosis Date Noted   Alcohol intoxication with delirium (HCC) 09/04/2022   Prepatellar bursitis, left knee 11/13/2021   Infection of left knee (HCC) 10/24/2021   Cellulitis of knee 10/23/2021   Uncontrolled type 2 diabetes mellitus with hyperglycemia (HCC) 10/23/2021   Past Medical History:  Diagnosis Date   Diabetes mellitus without complication (HCC)     Family History   Family history unknown: Yes    Past Surgical History:  Procedure Laterality Date   ABDOMINAL AORTOGRAM W/LOWER EXTREMITY Left 05/16/2023   Procedure: ABDOMINAL AORTOGRAM W/LOWER EXTREMITY;  Surgeon: Leonie Douglas, MD;  [facility];  Service: Cardiovascular;  Laterality: Left;   I & D EXTREMITY Left 10/24/2021   Procedure: IRRIGATION AND DEBRIDEMENT OF KNEE AND EXCISION PRE-PATELLA BURSA;  Surgeon: Ernestina Columbia, MD;  Location: [facility] OR;  Service: Orthopedics;  Laterality: Left;   PERIPHERAL VASCULAR BALLOON ANGIOPLASTY Left 05/16/2023   Procedure: PERIPHERAL VASCULAR BALLOON ANGIOPLASTY;  Surgeon: Leonie Douglas, MD;  [facility];  Service: Cardiovascular;  Laterality: Left;  left AT and left peroneal   Social History   Occupational History   Not on file  Tobacco Use   Smoking status: Never   Smokeless tobacco: Not on file  Vaping Use   Vaping status: Never Used  Substance and Sexual Activity   Alcohol use: Yes   Drug use: No   Sexual activity: Not on file

## 2023-06-26 ENCOUNTER — Other Ambulatory Visit: Payer: Self-pay

## 2023-06-26 ENCOUNTER — Encounter (HOSPITAL_COMMUNITY): Payer: Self-pay | Admitting: Emergency Medicine

## 2023-06-26 ENCOUNTER — Other Ambulatory Visit: Payer: Self-pay | Admitting: Orthopedic Surgery

## 2023-06-26 ENCOUNTER — Emergency Department (HOSPITAL_COMMUNITY)
Admission: EM | Admit: 2023-06-26 | Discharge: 2023-06-26 | Discharge status: Against Medical Advice | Payer: Self-pay | Attending: Emergency Medicine | Admitting: Emergency Medicine

## 2023-06-26 DIAGNOSIS — E11621 Type 2 diabetes mellitus with foot ulcer: Secondary | ICD-10-CM | POA: Insufficient documentation

## 2023-06-26 DIAGNOSIS — L97529 Non-pressure chronic ulcer of other part of left foot with unspecified severity: Secondary | ICD-10-CM | POA: Insufficient documentation

## 2023-06-26 DIAGNOSIS — Z5321 Procedure and treatment not carried out due to patient leaving prior to being seen by health care provider: Secondary | ICD-10-CM | POA: Insufficient documentation

## 2023-06-26 NOTE — ED Notes (Signed)
Pt's daughter states she was told that coming in may be a way to get his surgery sooner than Wednesday but after arrival in the ED and being told that no guarantees could be made that the surgery would be done early they elected to leave without treatment. Encouraged to stay but ultimately decided to leave.  Left the department in NAD

## 2023-06-26 NOTE — ED Triage Notes (Signed)
Pt c/o wound check for diabetic ulcers x 2 months on left foot with swelling x a few days.

## 2023-06-27 ENCOUNTER — Other Ambulatory Visit: Payer: Self-pay

## 2023-06-27 MED ORDER — HYDROCODONE-ACETAMINOPHEN 5-325 MG PO TABS
1.0000 | ORAL_TABLET | Freq: Three times a day (TID) | ORAL | 0 refills | Status: DC | PRN
  Filled 2023-06-27 – 2023-07-04 (×2): qty 30, 10d supply, fill #0

## 2023-07-01 ENCOUNTER — Encounter: Payer: Self-pay | Admitting: Orthopedic Surgery

## 2023-07-01 ENCOUNTER — Other Ambulatory Visit: Payer: Self-pay

## 2023-07-01 NOTE — Telephone Encounter (Signed)
I think I gave you a sheet about this gentleman for a toe amputation

## 2023-07-03 ENCOUNTER — Other Ambulatory Visit: Payer: Self-pay

## 2023-07-03 ENCOUNTER — Encounter (HOSPITAL_COMMUNITY): Payer: Self-pay | Admitting: Orthopedic Surgery

## 2023-07-03 NOTE — Progress Notes (Signed)
Patrick [name] made aware of surgery time change [phone], arrival 0940, and to follow all previous instructions.

## 2023-07-03 NOTE — Progress Notes (Signed)
Spoke with pt's daughter, Byrd Hesselbach Oros for pre-op call. Pt has hx of HTN and Diabetes. Byrd Hesselbach states pt does not have a cardiac history. Pt's last A1C was 8.9 on 03/06/23. Byrd Hesselbach states pt has not been taking his medications for several days due to him being in so much pain. Pt is on Trulicity, but did not take it on Saturday, 06/28/23, she is not sure when the last time he took his Jardiance. I told her to hold that as of today.  Pt is also on Plavix, Byrd Hesselbach is not sure how long he has not taken it. She states she won't have him take anything as of now prior to surgery.  Instructed Maria to have pt check his blood sugar when he wakes up in the AM and every 2 hours until he leaves for the hospital. If blood sugar is >220 take 1/2 of usual correction dose of Humalog insulin. If blood sugar is 70 or below, treat with 1/2 cup of clear juice (apple or cranberry) and recheck blood sugar 15 minutes after drinking juice. If blood sugar continues to be 70 or below, call the Short Stay department and ask to speak to a nurse. Maria voiced understanding.   Shower instructions given to Byrd Hesselbach and she voiced understanding.

## 2023-07-04 ENCOUNTER — Ambulatory Visit (HOSPITAL_COMMUNITY): Payer: Self-pay | Admitting: Certified Registered"

## 2023-07-04 ENCOUNTER — Other Ambulatory Visit: Payer: Self-pay

## 2023-07-04 ENCOUNTER — Observation Stay (HOSPITAL_COMMUNITY)
Admission: RE | Admit: 2023-07-04 | Discharge: 2023-07-05 | Disposition: A | Discharge status: Home / Self Care | Payer: Self-pay | Attending: Orthopedic Surgery | Admitting: Orthopedic Surgery

## 2023-07-04 ENCOUNTER — Ambulatory Visit (HOSPITAL_BASED_OUTPATIENT_CLINIC_OR_DEPARTMENT_OTHER): Payer: Self-pay | Admitting: Certified Registered"

## 2023-07-04 ENCOUNTER — Encounter (HOSPITAL_COMMUNITY)
Admission: RE | Disposition: A | Discharge status: Home / Self Care | Payer: Self-pay | Source: Home / Self Care | Attending: Orthopedic Surgery

## 2023-07-04 DIAGNOSIS — I96 Gangrene, not elsewhere classified: Secondary | ICD-10-CM

## 2023-07-04 DIAGNOSIS — S98132A Complete traumatic amputation of one left lesser toe, initial encounter: Secondary | ICD-10-CM

## 2023-07-04 DIAGNOSIS — E119 Type 2 diabetes mellitus without complications: Secondary | ICD-10-CM | POA: Insufficient documentation

## 2023-07-04 DIAGNOSIS — I10 Essential (primary) hypertension: Secondary | ICD-10-CM

## 2023-07-04 HISTORY — DX: Essential (primary) hypertension: I10

## 2023-07-04 HISTORY — DX: Peripheral vascular disease, unspecified: I73.9

## 2023-07-04 HISTORY — PX: AMPUTATION: SHX166

## 2023-07-04 LAB — CBC
HCT: 32.3 % — ABNORMAL LOW (ref 39.0–52.0)
Hemoglobin: 10.3 g/dL — ABNORMAL LOW (ref 13.0–17.0)
MCH: 27.3 pg (ref 26.0–34.0)
MCHC: 31.9 g/dL (ref 30.0–36.0)
MCV: 85.7 fL (ref 80.0–100.0)
Platelets: 292 10*3/uL (ref 150–400)
RBC: 3.77 MIL/uL — ABNORMAL LOW (ref 4.22–5.81)
RDW: 14.3 % (ref 11.5–15.5)
WBC: 7.7 10*3/uL (ref 4.0–10.5)
nRBC: 0 % (ref 0.0–0.2)

## 2023-07-04 LAB — BASIC METABOLIC PANEL
Anion gap: 13 (ref 5–15)
BUN: 20 mg/dL (ref 8–23)
CO2: 19 mmol/L — ABNORMAL LOW (ref 22–32)
Calcium: 8.5 mg/dL — ABNORMAL LOW (ref 8.9–10.3)
Chloride: 104 mmol/L (ref 98–111)
Creatinine, Ser: 0.7 mg/dL (ref 0.61–1.24)
GFR, Estimated: >60 mL/min (ref >60)
Glucose, Bld: 157 mg/dL — ABNORMAL HIGH (ref 70–99)
Potassium: 3.3 mmol/L — ABNORMAL LOW (ref 3.5–5.1)
Sodium: 136 mmol/L (ref 135–145)

## 2023-07-04 LAB — GLUCOSE, CAPILLARY
Glucose-Capillary: 185 mg/dL — ABNORMAL HIGH (ref 70–99)
Glucose-Capillary: 113 mg/dL — ABNORMAL HIGH (ref 70–99)
Glucose-Capillary: 77 mg/dL (ref 70–99)
Glucose-Capillary: 81 mg/dL (ref 70–99)
Glucose-Capillary: 117 mg/dL — ABNORMAL HIGH (ref 70–99)
Glucose-Capillary: 98 mg/dL (ref 70–99)

## 2023-07-04 LAB — HEMOGLOBIN A1C
Hgb A1c MFr Bld: 6.8 % — ABNORMAL HIGH (ref 4.8–5.6)
Mean Plasma Glucose: 148.46 mg/dL

## 2023-07-04 SURGERY — AMPUTATION DIGIT
Anesthesia: Monitor Anesthesia Care | Site: Foot | Laterality: Left

## 2023-07-04 MED ORDER — MAGNESIUM CITRATE PO SOLN: 1.0000 | Freq: Once | ORAL | Status: DC | PRN

## 2023-07-04 MED ORDER — JUVEN PO PACK
1.0000 | PACK | Freq: Two times a day (BID) | ORAL | Status: DC
  Administered 2023-07-05 (×2): 1 via ORAL
  Filled 2023-07-04 (×2): qty 1

## 2023-07-04 MED ORDER — PHENOL 1.4 % MT LIQD: 1.0000 | OROMUCOSAL | Status: DC | PRN

## 2023-07-04 MED ORDER — OXYCODONE HCL 5 MG PO TABS: 5.0000 mg | ORAL_TABLET | Freq: Once | ORAL | Status: AC | PRN

## 2023-07-04 MED ORDER — CEFAZOLIN SODIUM-DEXTROSE 2-4 GM/100ML-% IV SOLN
2.0000 g | INTRAVENOUS | Status: AC
  Administered 2023-07-04: 2 g via INTRAVENOUS

## 2023-07-04 MED ORDER — POLYETHYLENE GLYCOL 3350 17 G PO PACK: 17.0000 g | PACK | Freq: Every day | ORAL | Status: DC | PRN

## 2023-07-04 MED ORDER — EMPAGLIFLOZIN 25 MG PO TABS
25.0000 mg | ORAL_TABLET | Freq: Every day | ORAL | Status: DC
  Administered 2023-07-05: 25 mg via ORAL
  Filled 2023-07-04: qty 1

## 2023-07-04 MED ORDER — CHLORHEXIDINE GLUCONATE 0.12 % MT SOLN: 15.0000 mL | Freq: Once | OROMUCOSAL | Status: AC

## 2023-07-04 MED ORDER — INSULIN ASPART 100 UNIT/ML IJ SOLN
INTRAMUSCULAR | Status: AC
  Filled 2023-07-04: qty 1

## 2023-07-04 MED ORDER — ONDANSETRON HCL 4 MG/2ML IJ SOLN: 4.0000 mg | Freq: Four times a day (QID) | INTRAMUSCULAR | Status: DC | PRN

## 2023-07-04 MED ORDER — INSULIN ASPART 100 UNIT/ML IJ SOLN
4.0000 [IU] | Freq: Three times a day (TID) | INTRAMUSCULAR | Status: DC
  Administered 2023-07-04 – 2023-07-05 (×2): 4 [IU] via SUBCUTANEOUS

## 2023-07-04 MED ORDER — POTASSIUM CHLORIDE CRYS ER 20 MEQ PO TBCR: 20.0000 meq | EXTENDED_RELEASE_TABLET | Freq: Every day | ORAL | Status: DC | PRN

## 2023-07-04 MED ORDER — INFLUENZA VAC A&B SURF ANT ADJ 0.5 ML IM SUSY
0.5000 mL | PREFILLED_SYRINGE | INTRAMUSCULAR | Status: DC
  Filled 2023-07-04: qty 0.5

## 2023-07-04 MED ORDER — ACETAMINOPHEN 500 MG PO TABS
1000.0000 mg | ORAL_TABLET | Freq: Once | ORAL | Status: AC
  Administered 2023-07-04: 1000 mg via ORAL
  Filled 2023-07-04: qty 2

## 2023-07-04 MED ORDER — FENTANYL CITRATE (PF) 100 MCG/2ML IJ SOLN
INTRAMUSCULAR | Status: DC | PRN
  Administered 2023-07-04 (×2): 50 ug via INTRAVENOUS

## 2023-07-04 MED ORDER — OXYCODONE HCL 5 MG/5ML PO SOLN: 5.0000 mg | Freq: Once | ORAL | Status: AC | PRN

## 2023-07-04 MED ORDER — INSULIN ASPART 100 UNIT/ML IJ SOLN
0.0000 [IU] | Freq: Three times a day (TID) | INTRAMUSCULAR | Status: DC
  Administered 2023-07-05: 5 [IU] via SUBCUTANEOUS

## 2023-07-04 MED ORDER — PROPOFOL 500 MG/50ML IV EMUL
INTRAVENOUS | Status: DC | PRN
  Administered 2023-07-04: 50 ug/kg/min via INTRAVENOUS

## 2023-07-04 MED ORDER — FENTANYL CITRATE (PF) 100 MCG/2ML IJ SOLN
INTRAMUSCULAR | Status: AC
  Administered 2023-07-04: 25 ug via INTRAVENOUS
  Filled 2023-07-04: qty 2

## 2023-07-04 MED ORDER — MIDAZOLAM HCL 2 MG/2ML IJ SOLN
INTRAMUSCULAR | Status: AC
  Filled 2023-07-04: qty 2

## 2023-07-04 MED ORDER — ORAL CARE MOUTH RINSE: 15.0000 mL | Freq: Once | OROMUCOSAL | Status: AC

## 2023-07-04 MED ORDER — LIDOCAINE 2% (20 MG/ML) 5 ML SYRINGE
INTRAMUSCULAR | Status: AC
  Filled 2023-07-04: qty 5

## 2023-07-04 MED ORDER — CEFAZOLIN SODIUM-DEXTROSE 2-4 GM/100ML-% IV SOLN
2.0000 g | Freq: Three times a day (TID) | INTRAVENOUS | Status: AC
  Administered 2023-07-04 – 2023-07-05 (×2): 2 g via INTRAVENOUS
  Filled 2023-07-04 (×2): qty 100

## 2023-07-04 MED ORDER — METFORMIN HCL ER 500 MG PO TB24: 500.0000 mg | ORAL_TABLET | Freq: Two times a day (BID) | ORAL | Status: DC

## 2023-07-04 MED ORDER — SODIUM CHLORIDE 0.9 % IV SOLN: INTRAVENOUS | Status: DC

## 2023-07-04 MED ORDER — ALUM & MAG HYDROXIDE-SIMETH 200-200-20 MG/5ML PO SUSP: 15.0000 mL | ORAL | Status: DC | PRN

## 2023-07-04 MED ORDER — CEFAZOLIN SODIUM-DEXTROSE 2-4 GM/100ML-% IV SOLN
INTRAVENOUS | Status: AC
  Filled 2023-07-04: qty 100

## 2023-07-04 MED ORDER — BUPIVACAINE HCL 0.25 % IJ SOLN
INTRAMUSCULAR | Status: DC | PRN
Start: 2023-07-04 — End: 2023-07-04
  Administered 2023-07-04: 17 mL

## 2023-07-04 MED ORDER — ACETAMINOPHEN 325 MG PO TABS
325.0000 mg | ORAL_TABLET | Freq: Four times a day (QID) | ORAL | Status: DC | PRN
  Administered 2023-07-05: 650 mg via ORAL
  Filled 2023-07-04: qty 2

## 2023-07-04 MED ORDER — GUAIFENESIN-DM 100-10 MG/5ML PO SYRP: 15.0000 mL | ORAL_SOLUTION | ORAL | Status: DC | PRN

## 2023-07-04 MED ORDER — OXYCODONE HCL 5 MG PO TABS
ORAL_TABLET | ORAL | Status: AC
  Administered 2023-07-04: 5 mg via ORAL
  Filled 2023-07-04: qty 1

## 2023-07-04 MED ORDER — ATORVASTATIN CALCIUM 80 MG PO TABS
80.0000 mg | ORAL_TABLET | Freq: Every day | ORAL | Status: DC
  Administered 2023-07-05: 80 mg via ORAL
  Filled 2023-07-04: qty 1

## 2023-07-04 MED ORDER — ONDANSETRON HCL 4 MG/2ML IJ SOLN: 4.0000 mg | Freq: Once | INTRAMUSCULAR | Status: DC | PRN

## 2023-07-04 MED ORDER — FENTANYL CITRATE (PF) 100 MCG/2ML IJ SOLN
INTRAMUSCULAR | Status: AC
  Administered 2023-07-04: 50 ug
  Filled 2023-07-04: qty 2

## 2023-07-04 MED ORDER — PANTOPRAZOLE SODIUM 40 MG PO TBEC
40.0000 mg | DELAYED_RELEASE_TABLET | Freq: Every day | ORAL | Status: DC
  Administered 2023-07-05: 40 mg via ORAL
  Filled 2023-07-04: qty 1

## 2023-07-04 MED ORDER — METOPROLOL TARTRATE 5 MG/5ML IV SOLN: 2.0000 mg | INTRAVENOUS | Status: DC | PRN

## 2023-07-04 MED ORDER — ZINC SULFATE 220 (50 ZN) MG PO CAPS
220.0000 mg | ORAL_CAPSULE | Freq: Every day | ORAL | Status: DC
  Administered 2023-07-05: 220 mg via ORAL
  Filled 2023-07-04: qty 1

## 2023-07-04 MED ORDER — LACTATED RINGERS IV SOLN: INTRAVENOUS | Status: DC

## 2023-07-04 MED ORDER — ONDANSETRON HCL 4 MG/2ML IJ SOLN
INTRAMUSCULAR | Status: AC
  Filled 2023-07-04: qty 2

## 2023-07-04 MED ORDER — CLOPIDOGREL BISULFATE 75 MG PO TABS
75.0000 mg | ORAL_TABLET | Freq: Every day | ORAL | Status: DC
  Administered 2023-07-05: 75 mg via ORAL
  Filled 2023-07-04: qty 1

## 2023-07-04 MED ORDER — MAGNESIUM SULFATE 2 GM/50ML IV SOLN: 2.0000 g | Freq: Every day | INTRAVENOUS | Status: DC | PRN

## 2023-07-04 MED ORDER — PROPOFOL 10 MG/ML IV BOLUS
INTRAVENOUS | Status: AC
  Filled 2023-07-04: qty 20

## 2023-07-04 MED ORDER — LISINOPRIL 20 MG PO TABS
20.0000 mg | ORAL_TABLET | Freq: Every day | ORAL | Status: DC
  Administered 2023-07-05: 20 mg via ORAL
  Filled 2023-07-04: qty 1

## 2023-07-04 MED ORDER — BISACODYL 5 MG PO TBEC: 5.0000 mg | DELAYED_RELEASE_TABLET | Freq: Every day | ORAL | Status: DC | PRN

## 2023-07-04 MED ORDER — HYDROCODONE-ACETAMINOPHEN 5-325 MG PO TABS
1.0000 | ORAL_TABLET | ORAL | Status: DC | PRN
  Administered 2023-07-04: 2 via ORAL
  Filled 2023-07-04: qty 2

## 2023-07-04 MED ORDER — INSULIN ASPART 100 UNIT/ML IJ SOLN
0.0000 [IU] | INTRAMUSCULAR | Status: DC | PRN
  Administered 2023-07-04: 2 [IU] via SUBCUTANEOUS

## 2023-07-04 MED ORDER — FENTANYL CITRATE (PF) 100 MCG/2ML IJ SOLN
INTRAMUSCULAR | Status: AC
  Filled 2023-07-04: qty 2

## 2023-07-04 MED ORDER — ONDANSETRON HCL 4 MG/2ML IJ SOLN
INTRAMUSCULAR | Status: DC | PRN
  Administered 2023-07-04: 4 mg via INTRAVENOUS

## 2023-07-04 MED ORDER — PROPOFOL 10 MG/ML IV BOLUS
INTRAVENOUS | Status: DC | PRN
  Administered 2023-07-04: 20 mg via INTRAVENOUS
  Administered 2023-07-04: 30 mg via INTRAVENOUS
  Administered 2023-07-04 (×2): 20 mg via INTRAVENOUS
  Administered 2023-07-04: 50 mg via INTRAVENOUS

## 2023-07-04 MED ORDER — FENTANYL CITRATE (PF) 100 MCG/2ML IJ SOLN: 50.0000 ug | Freq: Once | INTRAMUSCULAR | Status: DC

## 2023-07-04 MED ORDER — FENTANYL CITRATE (PF) 100 MCG/2ML IJ SOLN: 25.0000 ug | INTRAMUSCULAR | Status: DC | PRN

## 2023-07-04 MED ORDER — BUPIVACAINE HCL (PF) 0.25 % IJ SOLN
INTRAMUSCULAR | Status: AC
  Filled 2023-07-04: qty 30

## 2023-07-04 MED ORDER — DOCUSATE SODIUM 100 MG PO CAPS
100.0000 mg | ORAL_CAPSULE | Freq: Every day | ORAL | Status: DC
  Administered 2023-07-05: 100 mg via ORAL
  Filled 2023-07-04: qty 1

## 2023-07-04 MED ORDER — MORPHINE SULFATE (PF) 2 MG/ML IV SOLN
0.5000 mg | INTRAVENOUS | Status: DC | PRN
  Administered 2023-07-04: 1 mg via INTRAVENOUS
  Filled 2023-07-04: qty 1

## 2023-07-04 MED ORDER — PROPOFOL 1000 MG/100ML IV EMUL
INTRAVENOUS | Status: AC
  Filled 2023-07-04: qty 100

## 2023-07-04 MED ORDER — LABETALOL HCL 5 MG/ML IV SOLN
10.0000 mg | INTRAVENOUS | Status: DC | PRN
  Administered 2023-07-04 – 2023-07-05 (×2): 10 mg via INTRAVENOUS
  Filled 2023-07-04 (×2): qty 4

## 2023-07-04 MED ORDER — VITAMIN C 500 MG PO TABS
1000.0000 mg | ORAL_TABLET | Freq: Every day | ORAL | Status: DC
  Administered 2023-07-05: 1000 mg via ORAL
  Filled 2023-07-04: qty 2

## 2023-07-04 MED ORDER — HYDRALAZINE HCL 20 MG/ML IJ SOLN: 5.0000 mg | INTRAMUSCULAR | Status: DC | PRN

## 2023-07-04 MED ORDER — HYDROCHLOROTHIAZIDE 25 MG PO TABS
25.0000 mg | ORAL_TABLET | Freq: Every day | ORAL | Status: DC
  Administered 2023-07-05: 25 mg via ORAL
  Filled 2023-07-04: qty 1

## 2023-07-04 MED ORDER — CHLORHEXIDINE GLUCONATE 0.12 % MT SOLN
OROMUCOSAL | Status: AC
  Administered 2023-07-04: 15 mL via OROMUCOSAL
  Filled 2023-07-04: qty 15

## 2023-07-04 MED ORDER — ENSURE ENLIVE PO LIQD
237.0000 mL | Freq: Two times a day (BID) | ORAL | Status: DC
  Administered 2023-07-05: 237 mL via ORAL

## 2023-07-04 MED ORDER — HYDROCODONE-ACETAMINOPHEN 7.5-325 MG PO TABS
1.0000 | ORAL_TABLET | ORAL | Status: DC | PRN
  Administered 2023-07-05: 2 via ORAL
  Administered 2023-07-05: 1 via ORAL
  Filled 2023-07-04: qty 1
  Filled 2023-07-04: qty 2

## 2023-07-04 MED ORDER — ASPIRIN 81 MG PO TBEC
81.0000 mg | DELAYED_RELEASE_TABLET | Freq: Every day | ORAL | Status: DC
  Administered 2023-07-05: 81 mg via ORAL
  Filled 2023-07-04: qty 1

## 2023-07-04 SURGICAL SUPPLY — 41 items
BAG COUNTER SPONGE SURGICOUNT (BAG) ×1 IMPLANT
BAG SPNG CNTER NS LX DISP (BAG) ×1
BLADE SURG 21 STRL SS (BLADE) ×1 IMPLANT
BNDG CMPR 5X6 CHSV STRCH STRL (GAUZE/BANDAGES/DRESSINGS) ×1
BNDG CMPR 9X4 STRL LF SNTH (GAUZE/BANDAGES/DRESSINGS)
BNDG COHESIVE 4X5 TAN STRL (GAUZE/BANDAGES/DRESSINGS) ×1 IMPLANT
BNDG COHESIVE 6X5 TAN ST LF (GAUZE/BANDAGES/DRESSINGS) IMPLANT
BNDG ESMARK 4X9 LF (GAUZE/BANDAGES/DRESSINGS) IMPLANT
BNDG GAUZE DERMACEA FLUFF 4 (GAUZE/BANDAGES/DRESSINGS) ×1 IMPLANT
BNDG GZE DERMACEA 4 6PLY (GAUZE/BANDAGES/DRESSINGS) ×1
COVER SURGICAL LIGHT HANDLE (MISCELLANEOUS) ×2 IMPLANT
DRAPE U-SHAPE 47X51 STRL (DRAPES) ×1 IMPLANT
DRSG ADAPTIC 3X8 NADH LF (GAUZE/BANDAGES/DRESSINGS) IMPLANT
DURAPREP 26ML APPLICATOR (WOUND CARE) ×1 IMPLANT
ELECT REM PT RETURN 9FT ADLT (ELECTROSURGICAL) ×1
ELECTRODE REM PT RTRN 9FT ADLT (ELECTROSURGICAL) ×1 IMPLANT
GAUZE PAD ABD 8X10 STRL (GAUZE/BANDAGES/DRESSINGS) ×1 IMPLANT
GAUZE SPONGE 4X4 12PLY STRL (GAUZE/BANDAGES/DRESSINGS) IMPLANT
GLOVE BIO SURGEON STRL SZ7.5 (GLOVE) IMPLANT
GLOVE BIOGEL PI IND STRL 9 (GLOVE) ×1 IMPLANT
GLOVE INDICATOR 8.0 STRL GRN (GLOVE) IMPLANT
GLOVE SURG ORTHO 9.0 STRL STRW (GLOVE) ×1 IMPLANT
GOWN STRL REUS W/ TWL XL LVL3 (GOWN DISPOSABLE) ×2 IMPLANT
GOWN STRL REUS W/TWL XL LVL3 (GOWN DISPOSABLE) ×2
GOWN STRL SURGICAL XL XLNG (GOWN DISPOSABLE) IMPLANT
GRAFT SKIN WND MICRO 38 (Tissue) IMPLANT
KIT BASIN OR (CUSTOM PROCEDURE TRAY) ×1 IMPLANT
KIT TURNOVER KIT B (KITS) ×1 IMPLANT
MANIFOLD NEPTUNE II (INSTRUMENTS) ×1 IMPLANT
NDL 22X1.5 STRL (OR ONLY) (MISCELLANEOUS) IMPLANT
NDL HYPO 21X1.5 SAFETY (NEEDLE) IMPLANT
NEEDLE 22X1.5 STRL (OR ONLY) (MISCELLANEOUS) IMPLANT
NEEDLE HYPO 21X1.5 SAFETY (NEEDLE) ×1 IMPLANT
NS IRRIG 1000ML POUR BTL (IV SOLUTION) ×1 IMPLANT
PACK ORTHO EXTREMITY (CUSTOM PROCEDURE TRAY) ×1 IMPLANT
PAD ARMBOARD 7.5X6 YLW CONV (MISCELLANEOUS) ×2 IMPLANT
SPONGE LAP 18X18 X RAY DECT (DISPOSABLE) IMPLANT
SUT ETHILON 2 0 PSLX (SUTURE) ×1 IMPLANT
SYR CONTROL 10ML LL (SYRINGE) IMPLANT
TOWEL GREEN STERILE (TOWEL DISPOSABLE) ×1 IMPLANT
TUBE CONNECTING 20X1/4 (TUBING) IMPLANT

## 2023-07-04 NOTE — Transfer of Care (Signed)
Immediate Anesthesia Transfer of Care Note  Patient: Patrick [name]  Procedure(s) Performed: LEFT 4TH TOE AMPUTATION (Left: Foot)  Patient Location: PACU  Anesthesia Type:MAC  Level of Consciousness: sedated  Airway & Oxygen Therapy: Patient Spontanous Breathing and Patient connected to face mask oxygen  Post-op Assessment: Report given to RN and Post -op Vital signs reviewed and stable  Post vital signs: Reviewed and stable  Last Vitals:  Vitals Value Taken Time  BP 130/71 07/04/23 1300  Temp    Pulse 65 07/04/23 1304  Resp 18 07/04/23 1304  SpO2 99 % 07/04/23 1304  Vitals shown include unfiled device data.  Last Pain:  Vitals:   07/04/23 1054  TempSrc:   PainSc: 10-Worst pain ever      Patients Stated Pain Goal: 2 (07/04/23 1054)  Complications: No notable events documented.

## 2023-07-04 NOTE — Op Note (Signed)
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 SURGEON:  Nadara Mustard, MD  PHYSICIAN ASSISTANT:None ANESTHESIA:   General  PREOPERATIVE INDICATIONS:  [name] is a  77 y.o. 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    The risks benefits and alternatives were discussed with the patient preoperatively including but not limited to the risks of infection, bleeding, nerve injury, cardiopulmonary complications, the need for revision surgery, among others, and the patient was willing to proceed.  OPERATIVE IMPLANTS:   Implant Name Type Inv. Item Serial No. Manufacturer Lot No. LRB No. Used Action  GRAFT SKIN WND MICRO 38 - [id number] Tissue GRAFT SKIN WND MICRO 38  [employer] [id number] Left 1 Implanted    @ENCIMAGES @  OPERATIVE FINDINGS: Patient had minimal petechial bleeding.  The tissue margins were clear no evidence of abscess or necrotic tissue at the wound edges.  OPERATIVE PROCEDURE: Patient was brought the operating room underwent a MAC anesthetic.  After AacuLose anesthesia obtained patient's left lower extremity was prepped using DuraPrep draped into a sterile field a timeout was called.  Patient underwent local infiltration with quarter percent Marcaine plain with 15 cc.  A V incision was made around the fourth toe and the fourth ray was resected through the base.  This was debrided back to healthy viable margins.  The wound was irrigated with normal saline.  There was minimal petechial bleeding.  The wound bed was filled with Kerecis micro graft 38 cm.  The skin was closed using 2-0 nylon a sterile dressing was applied patient was taken the PACU in stable condition.   DISCHARGE PLANNING:  Antibiotic duration: Preoperative  antibiotics  Weightbearing: Touchdown weightbearing on the left  Pain medication: Patient has pain medicine at home  Dressing care/ Wound VAC: Dry dressing reinforce as needed  Ambulatory devices: Patient has a walker  Discharge to: Home.  Follow-up: In the office 1 week post operative.

## 2023-07-04 NOTE — Anesthesia Preprocedure Evaluation (Addendum)
Anesthesia Evaluation  Patient identified by MRN, date of birth, ID band Patient awake    Reviewed: Allergy & Precautions, NPO status , Patient's Chart, lab work & pertinent test results  History of Anesthesia Complications Negative for: history of anesthetic complications  Airway Mallampati: II  TM Distance: >3 FB Neck ROM: Full    Dental  (+) Edentulous Lower, Edentulous Upper   Pulmonary neg pulmonary ROS   Pulmonary exam normal        Cardiovascular hypertension, Pt. on medications + Peripheral Vascular Disease  Normal cardiovascular exam     Neuro/Psych negative neurological ROS     GI/Hepatic negative GI ROS,,,(+)     substance abuse  alcohol use  Endo/Other  diabetes, Type 2, Oral Hypoglycemic Agents, Insulin Dependent   On GLP-1a   Renal/GU negative Renal ROS     Musculoskeletal negative musculoskeletal ROS (+)    Abdominal   Peds  Hematology  On plavix    Anesthesia Other Findings   Reproductive/Obstetrics                             Anesthesia Physical Anesthesia Plan  ASA: 3  Anesthesia Plan: MAC   Post-op Pain Management: Tylenol PO (pre-op)*   Induction:   PONV Risk Score and Plan: 1 and Propofol infusion and Treatment may vary due to age or medical condition  Airway Management Planned: Natural Airway and Simple Face Mask  Additional Equipment: None  Intra-op Plan:   Post-operative Plan:   Informed Consent: I have reviewed the patients History and Physical, chart, labs and discussed the procedure including the risks, benefits and alternatives for the proposed anesthesia with the patient or authorized representative who has indicated his/her understanding and acceptance.     Interpreter used for [employer] Discussed with: CRNA and Anesthesiologist  Anesthesia Plan Comments:         Anesthesia Quick Evaluation

## 2023-07-04 NOTE — H&P (Signed)
Patrick [name] is an 77 y.o. male.   Chief Complaint: Ulceration left fourth toe. HPI: Patient is a 77 year old gentleman who is seen in follow-up for ischemic ulcer fourth webspace between the fourth and fifth toes. Patient has completed his course of doxycycline. He is using the nitroglycerin patch to improve the microcirculation. Patient complains of increased ischemic pain. Patient is status post endovascular reconstruction of the left lower extremity July 19.   Past Medical History:  Diagnosis Date   Diabetes mellitus without complication (HCC)    Hypertension    Peripheral vascular disease [facility])     Past Surgical History:  Procedure Laterality Date   ABDOMINAL AORTOGRAM W/LOWER EXTREMITY Left 05/16/2023   Procedure: ABDOMINAL AORTOGRAM W/LOWER EXTREMITY;  Surgeon: Leonie Douglas, MD;  Location: [facility] INVASIVE CV LAB;  Service: Cardiovascular;  Laterality: Left;   I & D EXTREMITY Left 10/24/2021   Procedure: IRRIGATION AND DEBRIDEMENT OF KNEE AND EXCISION PRE-PATELLA BURSA;  Surgeon: Ernestina Columbia, MD;  Location: [facility] OR;  Service: Orthopedics;  Laterality: Left;   PERIPHERAL VASCULAR BALLOON ANGIOPLASTY Left 05/16/2023   Procedure: PERIPHERAL VASCULAR BALLOON ANGIOPLASTY;  Surgeon: Leonie Douglas, MD;  Location: [facility] INVASIVE CV LAB;  Service: Cardiovascular;  Laterality: Left;  left AT and left peroneal    Family History  Family history unknown: Yes   Social History:  reports that he has never smoked. He does not have any smokeless tobacco history on file. He reports that he does not currently use alcohol. He reports that he does not use drugs.  Allergies: No Known Allergies  No medications prior to admission.    No results found for this or any previous visit (from the past 48 hour(s)). No results found.  Review of Systems  All other systems reviewed and are negative.   There were no vitals taken for this visit. Physical Exam  Patient is alert, oriented, no  adenopathy, well-dressed, normal affect, normal respiratory effort. On examination of the left foot the fourth webspace ulcer is stable there is no exposed bone there is maceration in the webspace the toe has darkened discoloration.  There is a palpable dorsalis pedis pulse Assessment/Plan Assessment: Osteomyelitis and ulceration left fourth toe.  Will plan for left fourth ray amputation.  Risk and benefits were discussed including risk of the wound not healing need for additional surgery.  Patient states he understands wished to proceed at this time.  Nadara Mustard, MD 07/04/2023, 6:54 AM

## 2023-07-04 NOTE — Interval H&P Note (Signed)
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  The various methods of treatment have been discussed with the patient and family. After consideration of risks, benefits and other options for treatment, the patient has consented to  Procedure(s): LEFT 4TH TOE AMPUTATION (Left) as a surgical intervention.  The patient's history has been reviewed, patient examined, no change in status, stable for surgery.  I have reviewed the patient's chart and labs.  Questions were answered to the patient's satisfaction.     Nadara Mustard

## 2023-07-04 NOTE — Anesthesia Postprocedure Evaluation (Signed)
Anesthesia Post Note  Patient: Patrick [name]  Procedure(s) Performed: LEFT 4TH TOE AMPUTATION (Left: Foot)     Patient location during evaluation: PACU Anesthesia Type: MAC Level of consciousness: awake and alert Pain management: pain level controlled Vital Signs Assessment: post-procedure vital signs reviewed and stable Respiratory status: spontaneous breathing, nonlabored ventilation and respiratory function stable Cardiovascular status: stable and blood pressure returned to baseline Anesthetic complications: no   No notable events documented.  Last Vitals:  Vitals:   07/04/23 1345 07/04/23 1400  BP: (!) 172/87 (!) 172/81  Pulse: 69 66  Resp: 15 18  Temp:  36.5 C  SpO2: 99% 99%    Last Pain:  Vitals:   07/04/23 1400  TempSrc:   PainSc: 0-No pain                 Beryle Lathe

## 2023-07-05 ENCOUNTER — Other Ambulatory Visit (HOSPITAL_COMMUNITY): Payer: Self-pay

## 2023-07-05 LAB — GLUCOSE, CAPILLARY
Glucose-Capillary: 115 mg/dL — ABNORMAL HIGH (ref 70–99)
Glucose-Capillary: 245 mg/dL — ABNORMAL HIGH (ref 70–99)

## 2023-07-05 MED ORDER — HYDROCODONE-ACETAMINOPHEN 5-325 MG PO TABS
1.0000 | ORAL_TABLET | ORAL | 0 refills | Status: DC | PRN
Start: 2023-07-05 — End: 2023-07-23
  Filled 2023-07-05 – 2023-07-14 (×3): qty 30, 5d supply, fill #0

## 2023-07-05 NOTE — Progress Notes (Signed)
Orthopedic Tech Progress Note Patient Details:  Patrick [name] [birth year] [id number]  Ortho Devices Type of Ortho Device: Postop shoe/boot Ortho Device/Splint Location: Left foot Ortho Device/Splint Interventions: Application   Post Interventions Patient Tolerated: Well  Patrick [name] 07/05/2023, 3:12 PM

## 2023-07-05 NOTE — Discharge Summary (Signed)
Discharge Diagnoses:  Principal Problem:   Amputated toe of left foot (HCC) Active Problems:   Gangrene of toe of left foot (HCC)   Surgeries: Procedure(s): LEFT 4TH TOE AMPUTATION on 07/04/2023    Consultants:   Discharged Condition: Improved  Hospital Course: Patrick [name] is an 77 y.o. 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  Patient was brought to the operating room on 07/04/2023 and underwent Procedure(s): LEFT 4TH TOE AMPUTATION.    Patient was given perioperative antibiotics:  Anti-infectives (From admission, onward)    Start     Dose/Rate Route Frequency Ordered Stop   07/04/23 1800  ceFAZolin (ANCEF) IVPB 2g/100 mL premix        2 g 200 mL/hr over 30 Minutes Intravenous Every 8 hours 07/04/23 1405 07/05/23 0253   07/04/23 1020  ceFAZolin (ANCEF) 2-4 GM/100ML-% IVPB       Note to Pharmacy: Gleason, Ginger E: cabinet override      07/04/23 1020 07/04/23 1222   07/04/23 1015  ceFAZolin (ANCEF) IVPB 2g/100 mL premix        2 g 200 mL/hr over 30 Minutes Intravenous On call to O.R. 07/04/23 1007 07/04/23 1216     .  Patient was given sequential compression devices, early ambulation, and aspirin for DVT prophylaxis.  Recent vital signs: Patient Vitals for the past 24 hrs:  BP Temp Temp src Pulse Resp SpO2 Height Weight  07/05/23 0828 (!) 153/75 (!) 100.8 F (38.2 C) Oral 89 15 95 % -- --  07/05/23 0614 (!) 176/88 (!) 100.8 F (38.2 C) Oral 92 20 96 % -- --  07/04/23 2050 (!) 152/80 98.8 F (37.1 C) Oral 69 18 -- -- --  07/04/23 1929 (!) 184/82 -- -- 75 -- -- -- --  07/04/23 1727 (!) 180/74 98 F (36.7 C) Oral 65 16 93 % -- --  07/04/23 1645 -- -- -- 76 15 99 % -- --  07/04/23 1600 (!) 152/73 -- -- 61 12 97 % -- --  07/04/23 1500 (!) 153/69 -- -- (!) 59 11 97 % -- --  07/04/23 1400 (!) 172/81 97.7 F (36.5 C) -- 66 18 99 % -- --  07/04/23 1345 (!) 172/87 -- -- 69 15 99  % -- --  07/04/23 1330 (!) 173/81 -- -- 73 13 98 % -- --  07/04/23 1315 (!) 163/81 -- -- 72 (!) 21 97 % -- --  07/04/23 1300 130/71 98.5 F (36.9 C) -- 63 13 99 % -- --  07/04/23 1005 (!) 150/80 98.9 F (37.2 C) Oral 77 17 98 % 5\' 1"  (1.549 m) 59 kg  .  Recent laboratory studies: No results found.  Discharge Medications:   Allergies as of 07/05/2023   No Known Allergies      Medication List     TAKE these medications    acetaminophen 500 MG tablet Commonly known as: TYLENOL Take 1,000 mg by mouth every 8 (eight) hours as needed (pain.).   Aspirin Low Dose 81 MG tablet Generic drug: aspirin EC Take 1 tablet (81 mg total) by mouth daily. Swallow whole.   atorvastatin 80 MG tablet Commonly known as: LIPITOR Tome 1 tableta (80 mg en total) por va oral diariamente. (Take 1 tablet (80 mg total) by mouth daily.)   clopidogrel 75 MG tablet Commonly known as: Plavix Tome 1 tableta (75 mg en total) por va oral  diariamente. (Take 1 tablet (75 mg total) by mouth daily.)   HumaLOG 100 UNIT/ML injection Generic drug: insulin lispro Inject 0.1 mLs (10 Units total) into the skin 3 (three) times daily before meals. For blood sugars 0-150: 0 units, 151-200: 2 units, 201-250: 4 units, 251-300 6 units, 301-350 8 units, 351-400 10 units,> 400:12 units & call M.D. What changed:  how much to take when to take this reasons to take this additional instructions   hydrochlorothiazide 25 MG tablet Commonly known as: HYDRODIURIL Tome 1 tableta (25 mg en total) por va oral diariamente. (Take 1 tablet (25 mg total) by mouth daily.) What changed: when to take this   HYDROcodone-acetaminophen 5-325 MG tablet Commonly known as: NORCO/VICODIN Take 1 tablet by mouth every 8 (eight) hours as needed for severe pain. What changed: Another medication with the same name was added. Make sure you understand how and when to take each.   HYDROcodone-acetaminophen 5-325 MG tablet Commonly known as:  NORCO/VICODIN Take 1 tablet by mouth every 4 (four) hours as needed. What changed: You were already taking a medication with the same name, and this prescription was added. Make sure you understand how and when to take each.   Jardiance 25 MG Tabs tablet Generic drug: empagliflozin Tome 1 tableta (25 mg en total) por va oral diariamente antes de desayunar. (Take 1 tablet (25 mg total) by mouth daily before breakfast.)   lisinopril 20 MG tablet Commonly known as: ZESTRIL Tome 1 tableta (20 mg en total) por va oral diariamente. (Take 1 tablet (20 mg total) by mouth daily.) What changed: when to take this   metFORMIN 500 MG 24 hr tablet Commonly known as: GLUCOPHAGE-XR Take 1 tablet (500 mg total) by mouth 2 (two) times daily before a meal.   nitroGLYCERIN 0.2 mg/hr patch Commonly known as: NITRODUR - Dosed in mg/24 hr Place 1 patch (0.2 mg total) onto the skin daily.   sodium chloride 0.65 % Soln nasal spray Commonly known as: OCEAN Place 1 spray into both nostrils as needed for congestion.   TechLite Plus Pen Needles 32G X 4 MM Misc Generic drug: Insulin Pen Needle use up to four times daily as directed   Trulicity 0.75 MG/0.5ML Sopn Generic drug: Dulaglutide Inject 0.75 mg into the skin once a week.               Discharge Care Instructions  (From admission, onward)           Start     Ordered   07/05/23 0000  Touch down weight bearing       Question Answer Comment  Laterality left   Extremity Lower      07/05/23 0915   07/04/23 0000  Touch down weight bearing       Question Answer Comment  Laterality left   Extremity Lower      07/04/23 1251            Diagnostic Studies: No results found.  Patient benefited maximally from their hospital stay and there were no complications.     Disposition: Discharge disposition: 01-Home or Self Care      Discharge Instructions     Call MD / Call 911   Complete by: As directed    If you  experience chest pain or shortness of breath, CALL 911 and be transported to the hospital emergency room.  If you develope a fever above 101 F, pus (white drainage) or increased drainage or redness at the  wound, or calf pain, call your surgeon's office.   Call MD / Call 911   Complete by: As directed    If you experience chest pain or shortness of breath, CALL 911 and be transported to the hospital emergency room.  If you develope a fever above 101 F, pus (white drainage) or increased drainage or redness at the wound, or calf pain, call your surgeon's office.   Constipation Prevention   Complete by: As directed    Drink plenty of fluids.  Prune juice may be helpful.  You may use a stool softener, such as Colace (over the counter) 100 mg twice a day.  Use MiraLax (over the counter) for constipation as needed.   Constipation Prevention   Complete by: As directed    Drink plenty of fluids.  Prune juice may be helpful.  You may use a stool softener, such as Colace (over the counter) 100 mg twice a day.  Use MiraLax (over the counter) for constipation as needed.   Diet - low sodium heart healthy   Complete by: As directed    Diet - low sodium heart healthy   Complete by: As directed    Elevate operative extremity   Complete by: As directed    Increase activity slowly as tolerated   Complete by: As directed    Increase activity slowly as tolerated   Complete by: As directed    Post-op shoe   Complete by: As directed    Post-operative opioid taper instructions:   Complete by: As directed    POST-OPERATIVE OPIOID TAPER INSTRUCTIONS: It is important to wean off of your opioid medication as soon as possible. If you do not need pain medication after your surgery it is ok to stop day one. Opioids include: Codeine, Hydrocodone(Norco, Vicodin), Oxycodone(Percocet, oxycontin) and hydromorphone amongst others.  Long term and even short term use of opiods can cause: Increased pain  response Dependence Constipation Depression Respiratory depression And more.  Withdrawal symptoms can include Flu like symptoms Nausea, vomiting And more Techniques to manage these symptoms Hydrate well Eat regular healthy meals Stay active Use relaxation techniques(deep breathing, meditating, yoga) Do Not substitute Alcohol to help with tapering If you have been on opioids for less than two weeks and do not have pain than it is ok to stop all together.  Plan to wean off of opioids This plan should start within one week post op of your joint replacement. Maintain the same interval or time between taking each dose and first decrease the dose.  Cut the total daily intake of opioids by one tablet each day Next start to increase the time between doses. The last dose that should be eliminated is the evening dose.      Post-operative opioid taper instructions:   Complete by: As directed    POST-OPERATIVE OPIOID TAPER INSTRUCTIONS: It is important to wean off of your opioid medication as soon as possible. If you do not need pain medication after your surgery it is ok to stop day one. Opioids include: Codeine, Hydrocodone(Norco, Vicodin), Oxycodone(Percocet, oxycontin) and hydromorphone amongst others.  Long term and even short term use of opiods can cause: Increased pain response Dependence Constipation Depression Respiratory depression And more.  Withdrawal symptoms can include Flu like symptoms Nausea, vomiting And more Techniques to manage these symptoms Hydrate well Eat regular healthy meals Stay active Use relaxation techniques(deep breathing, meditating, yoga) Do Not substitute Alcohol to help with tapering If you have been on opioids for less  than two weeks and do not have pain than it is ok to stop all together.  Plan to wean off of opioids This plan should start within one week post op of your joint replacement. Maintain the same interval or time between taking  each dose and first decrease the dose.  Cut the total daily intake of opioids by one tablet each day Next start to increase the time between doses. The last dose that should be eliminated is the evening dose.      Touch down weight bearing   Complete by: As directed    Laterality: left   Extremity: Lower   Touch down weight bearing   Complete by: As directed    Laterality: left   Extremity: Lower       Follow-up Information     Nadara Mustard, MD Follow up in 1 week(s).   Specialty: Orthopedic Surgery Contact information: [address] [place] [address] [phone]                  Signed: Nadara Mustard 07/05/2023, 9:15 AM

## 2023-07-05 NOTE — Evaluation (Signed)
Physical Therapy Evaluation Patient Details Name: Patrick [name] MRN: [id number] DOB: 1946-08-19 Today's Date: 07/05/2023  History of Present Illness  Patient is 77 y.o. 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 PMH significant for DM, HTN, PVD.   Clinical Impression  Patrick [name] is a 77 y.o. male POD 1 s/p Lt 4th toe amputation. Patient reports mod ind with RW at home for short bouts of gait and assist for mobility and ADL's at baseline. Patient is now limited by functional impairments (see PT problem list below) and requires min assist/CGA for transfers and gait with RW. Patient was able to ambulate ~50 feet with RW and min assist. Pt completed stair mobility with min assist and cues for sequencing. Daughter present and verbalized understanding of cues and assist needed for safety with transfers, gait, and stairs. Patient will benefit from continued skilled PT interventions to address impairments and progress towards PLOF. Acute PT will follow to progress mobility and stair training in preparation for safe discharge home.         If plan is discharge home, recommend the following: A little help with walking and/or transfers;A little help with bathing/dressing/bathroom;Assistance with cooking/housework;Direct supervision/assist for medications management;Assist for transportation;Help with stairs or ramp for entrance;Supervision due to cognitive status   Can travel by private vehicle        Equipment Recommendations Rolling walker (2 wheels);BSC/3in1 (youth)  Recommendations for Other Services       Functional Status Assessment Patient has had a recent decline in their functional status and/or demonstrates limited ability to make significant improvements in function in a reasonable and predictable amount of time     Precautions / Restrictions  Precautions Precautions: Fall Restrictions Weight Bearing Restrictions: Yes LLE Weight Bearing: Touchdown weight bearing      Mobility  Bed Mobility Overal bed mobility: Needs Assistance Bed Mobility: Supine to Sit     Supine to sit: Supervision, HOB elevated, Used rails     General bed mobility comments: sup for safety    Transfers Overall transfer level: Needs assistance Equipment used: Rolling walker (2 wheels) Transfers: Sit to/from Stand Sit to Stand: Contact guard assist           General transfer comment: CGA for safety with cues for hand placement/technique to RW    Ambulation/Gait Ambulation/Gait assistance: Min assist, Contact guard assist Gait Distance (Feet): 50 Feet Assistive device: Rolling walker (2 wheels) Gait Pattern/deviations: Step-through pattern, Decreased stance time - left, Decreased weight shift to right, Trunk flexed, Antalgic Gait velocity: decr     General Gait Details: cues for proximity to RW throughout to improve use of UE's to reduce pressure on Lt LE, pt to be TDWB on heel of Lt foot, educated at start and pt maintained throughout.  Stairs Stairs: Yes Stairs assistance: Min assist Stair Management: One rail Left, Step to pattern, Sideways Number of Stairs: 2 General stair comments: cues for sequence/technique and hand placement on same rail as pt's steps are too wide to reach both at home. Min assist to steady and sequence.  Wheelchair Mobility     Tilt Bed    Modified Rankin (Stroke Patients Only)       Balance Overall balance assessment: Needs assistance Sitting-balance support: Feet supported, Bilateral upper extremity supported, No upper extremity supported Sitting balance-Leahy Scale: Good     Standing balance  support: Bilateral upper extremity supported, During functional activity, Reliant on assistive device for balance Standing balance-Leahy Scale: Poor                               Pertinent  Vitals/Pain      Home Living Family/patient expects to be discharged to:: Private residence Living Arrangements: Spouse/significant other;Children Available Help at Discharge: Family;Available 24 hours/day (daughter, spouse, sons, and grandsons can assist) Type of Home: House Home Access: Stairs to enter Entrance Stairs-Rails: Lawyer of Steps: 5   Home Layout: One level Home Equipment: Agricultural consultant (2 wheels)      Prior Function Prior Level of Function : Needs assist       Physical Assist : Mobility (physical);ADLs (physical) Mobility (physical): Stairs;Gait ADLs (physical): Bathing;Dressing;Grooming;IADLs;Toileting Mobility Comments: pt using RW to amb short distances in house. pt requires assist from spouse and daughter. ADLs Comments: assist for bathring/dressing from pt's spouse and daughter.     Extremity/Trunk Assessment   Upper Extremity Assessment Upper Extremity Assessment: Generalized weakness    Lower Extremity Assessment Lower Extremity Assessment: Generalized weakness;RLE deficits/detail;LLE deficits/detail RLE Deficits / Details: low muscle mass on bil LE's, grossly l3/5 throughout, ROM WFL's LLE Deficits / Details: low muscle mass on bil LE's, grossly l3/5 throughout, ROM WFL's. dressing in place with no excessive drainage on Lt foot.    Cervical / Trunk Assessment Cervical / Trunk Assessment: Normal  Communication   Communication Communication: No apparent difficulties (via interpretter, daughter translating for pt.)  Cognition Arousal: Alert Behavior During Therapy: WFL for tasks assessed/performed Overall Cognitive Status: Impaired/Different from baseline Area of Impairment: Memory, Orientation                 Orientation Level: Disoriented to, Situation   Memory: Decreased short-term memory         General Comments: pt unsures of why his foot hurts so badly and required re-orientation multiple times.         General Comments      Exercises     Assessment/Plan    PT Assessment Patient needs continued PT services  PT Problem List Decreased strength;Decreased activity tolerance;Decreased balance;Decreased mobility;Decreased cognition;Decreased knowledge of use of DME;Decreased safety awareness;Decreased knowledge of precautions;Impaired sensation;Decreased skin integrity;Pain       PT Treatment Interventions DME instruction;Gait training;Stair training;Functional mobility training;Therapeutic activities;Therapeutic exercise;Balance training;Neuromuscular re-education;Cognitive remediation;Patient/family education;Wheelchair mobility training;Manual techniques    PT Goals (Current goals can be found in the Care Plan section)  Acute Rehab PT Goals Patient Stated Goal: go home, foot to stop hurting PT Goal Formulation: With patient/family Time For Goal Achievement: 07/19/23 Potential to Achieve Goals: Fair    Frequency Min 1X/week     Co-evaluation               AM-PAC PT "6 Clicks" Mobility  Outcome Measure Help needed turning from your back to your side while in a flat bed without using bedrails?: A Little Help needed moving from lying on your back to sitting on the side of a flat bed without using bedrails?: A Little Help needed moving to and from a bed to a chair (including a wheelchair)?: A Little Help needed standing up from a chair using your arms (e.g., wheelchair or bedside chair)?: A Little Help needed to walk in hospital room?: A Little Help needed climbing 3-5 steps with a railing? : A Little 6 Click Score: 18    End  of Session Equipment Utilized During Treatment: Gait belt Activity Tolerance: Patient tolerated treatment well;Patient limited by pain Patient left: in chair;with call bell/phone within reach;with family/visitor present Nurse Communication: Mobility status;Weight bearing status PT Visit Diagnosis: Unsteadiness on feet (R26.81);Other abnormalities  of gait and mobility (R26.89);Muscle weakness (generalized) (M62.81);Difficulty in walking, not elsewhere classified (R26.2);Pain Pain - Right/Left: Left Pain - part of body: Ankle and joints of foot    Time: 1020-1051 PT Time Calculation (min) (ACUTE ONLY): 31 min   Charges:   PT Evaluation $PT Eval Moderate Complexity: 1 Mod PT Treatments $Gait Training: 8-22 mins PT General Charges $$ ACUTE PT VISIT: 1 Visit         Wynn Maudlin, DPT Acute Rehabilitation Services Office [phone]  07/05/23 2:20 PM

## 2023-07-05 NOTE — Plan of Care (Signed)
  Problem: Education: Goal: Ability to describe self-care measures that may prevent or decrease complications (Diabetes Survival Skills Education) will improve Outcome: Adequate for Discharge Goal: Individualized Educational Video(s) Outcome: Adequate for Discharge   Problem: Coping: Goal: Ability to adjust to condition or change in health will improve Outcome: Adequate for Discharge   Problem: Fluid Volume: Goal: Ability to maintain a balanced intake and output will improve Outcome: Adequate for Discharge   Problem: Health Behavior/Discharge Planning: Goal: Ability to identify and utilize available resources and services will improve Outcome: Adequate for Discharge Goal: Ability to manage health-related needs will improve Outcome: Adequate for Discharge   Problem: Metabolic: Goal: Ability to maintain appropriate glucose levels will improve Outcome: Adequate for Discharge   Problem: Nutritional: Goal: Maintenance of adequate nutrition will improve Outcome: Adequate for Discharge Goal: Progress toward achieving an optimal weight will improve Outcome: Adequate for Discharge   Problem: Skin Integrity: Goal: Risk for impaired skin integrity will decrease Outcome: Adequate for Discharge   Problem: Tissue Perfusion: Goal: Adequacy of tissue perfusion will improve Outcome: Adequate for Discharge   Problem: Education: Goal: Knowledge of the prescribed therapeutic regimen will improve Outcome: Adequate for Discharge Goal: Ability to verbalize activity precautions or restrictions will improve Outcome: Adequate for Discharge Goal: Understanding of discharge needs will improve Outcome: Adequate for Discharge   Problem: Activity: Goal: Ability to perform//tolerate increased activity and mobilize with assistive devices will improve Outcome: Adequate for Discharge   Problem: Clinical Measurements: Goal: Postoperative complications will be avoided or minimized Outcome: Adequate  for Discharge   Problem: Self-Care: Goal: Ability to meet self-care needs will improve Outcome: Adequate for Discharge   Problem: Self-Concept: Goal: Ability to maintain and perform role responsibilities to the fullest extent possible will improve Outcome: Adequate for Discharge   Problem: Pain Management: Goal: Pain level will decrease with appropriate interventions Outcome: Adequate for Discharge   Problem: Skin Integrity: Goal: Demonstration of wound healing without infection will improve Outcome: Adequate for Discharge   Problem: Education: Goal: Knowledge of General Education information will improve Description: Including pain rating scale, medication(s)/side effects and non-pharmacologic comfort measures Outcome: Adequate for Discharge   Problem: Health Behavior/Discharge Planning: Goal: Ability to manage health-related needs will improve Outcome: Adequate for Discharge   Problem: Clinical Measurements: Goal: Ability to maintain clinical measurements within normal limits will improve Outcome: Adequate for Discharge Goal: Will remain free from infection Outcome: Adequate for Discharge Goal: Diagnostic test results will improve Outcome: Adequate for Discharge Goal: Respiratory complications will improve Outcome: Adequate for Discharge Goal: Cardiovascular complication will be avoided Outcome: Adequate for Discharge   Problem: Activity: Goal: Risk for activity intolerance will decrease Outcome: Adequate for Discharge   Problem: Nutrition: Goal: Adequate nutrition will be maintained Outcome: Adequate for Discharge   Problem: Coping: Goal: Level of anxiety will decrease Outcome: Adequate for Discharge   Problem: Elimination: Goal: Will not experience complications related to bowel motility Outcome: Adequate for Discharge Goal: Will not experience complications related to urinary retention Outcome: Adequate for Discharge   Problem: Pain Managment: Goal:  General experience of comfort will improve Outcome: Adequate for Discharge   Problem: Safety: Goal: Ability to remain free from injury will improve Outcome: Adequate for Discharge   Problem: Skin Integrity: Goal: Risk for impaired skin integrity will decrease Outcome: Adequate for Discharge

## 2023-07-05 NOTE — TOC Transition Note (Signed)
Transition of Care [facility]) - CM/SW Discharge Note   Patient Details  Name: Patrick [name] MRN: [id number] Date of Birth: [birth year]  Transition of Care [facility]) CM/SW Contact:  Ronny Bacon, RN Phone Number: 07/05/2023, 1:46 PM   Clinical Narrative:   Patient is being discharged home. Patient is uninsured and unable to afford DME equipment. Daughter at bedside reports has a walker at home the patient can use.    Final next level of care: Home/Self Care Barriers to Discharge: No Barriers Identified   Patient Goals and CMS Choice      Discharge Placement                         Discharge Plan and Services Additional resources added to the After Visit Summary for                  DME Arranged: Patient refused services (daughter reports has a walker at home patient can use)                    Social Determinants of Health (SDOH) Interventions SDOH Screenings   Food Insecurity: No Food Insecurity (07/04/2023)  Housing: Low Risk  (07/04/2023)  Transportation Needs: No Transportation Needs (07/04/2023)  Utilities: Not At Risk (07/04/2023)  Depression (PHQ2-9): Low Risk  (05/06/2023)  Tobacco Use: Unknown (07/03/2023)     Readmission Risk Interventions     No data to display

## 2023-07-05 NOTE — Progress Notes (Signed)
Patient ID: Patrick [name], male   DOB: 08-09-1946, 77 y.o.   MRN: [id number] Patient is status post ray amputation.  The dressing is clean and dry.  Plan for discharge to home today.  Follow-up in the office 1 to 2 weeks.

## 2023-07-05 NOTE — Progress Notes (Signed)
Patient discharged, discharge instructions given and explained to patient and family whom speaks english. Family states understanding. No additional questions and or concerns at this time. Patient IV removed per protocol. Patient awaiting DME to arrive to discharge home with family.

## 2023-07-06 ENCOUNTER — Encounter (HOSPITAL_COMMUNITY): Payer: Self-pay | Admitting: Orthopedic Surgery

## 2023-07-07 ENCOUNTER — Encounter: Payer: Self-pay | Admitting: Orthopedic Surgery

## 2023-07-08 ENCOUNTER — Ambulatory Visit (INDEPENDENT_AMBULATORY_CARE_PROVIDER_SITE_OTHER): Payer: Self-pay | Admitting: Orthopedic Surgery

## 2023-07-08 ENCOUNTER — Encounter: Payer: Self-pay | Admitting: Orthopedic Surgery

## 2023-07-08 DIAGNOSIS — I739 Peripheral vascular disease, unspecified: Secondary | ICD-10-CM

## 2023-07-08 DIAGNOSIS — I96 Gangrene, not elsewhere classified: Secondary | ICD-10-CM

## 2023-07-08 NOTE — Progress Notes (Signed)
Office Visit Note   Patient: Patrick [name]           Date of Birth: 1945/11/04           MRN: [id number] Visit Date: 07/08/2023              Requested by: No referring provider defined for this encounter. PCP: Pcp, No  Chief Complaint  Patient presents with   Left Foot - Routine Post Op    07/04/23 left 4th ray amputation      HPI: Patient is a 77 year old gentleman who presents 4 days status post left foot fourth ray amputation status post endovascular revascularization with vascular vein surgery.  Patient states he is having severe pain states he cannot elevate his leg.  Stated that he could not use the nitroglycerin patch.  Assessment & Plan: Visit Diagnoses:  1. PAD (peripheral artery disease) (HCC)   2. Gangrene of left foot (HCC)     Plan: With the rapidly progressive gangrenous changes to the left foot discussed that his only option would be to proceed with a transtibial amputation.  Risks and benefits were discussed including risk of the wound not healing need for additional surgery.  Patient states he understands wished to proceed at this time.  Patient states he has sufficient pain medicine available.  Follow-Up Instructions: Return in about 2 weeks (around 07/22/2023).   Ortho Exam  Patient is alert, oriented, no adenopathy, well-dressed, normal affect, normal respiratory effort. Examination patient is 4 days status post left foot fourth ray amputation.  There is ischemic black gangrenous changes over the dorsum of the left foot as well as plantar aspect of the forefoot.  These are new acute gangrenous changes.  The Doppler was used and he does have a strong monophasic dorsalis pedis pulse he still has inline flow to the ankle.  Imaging: No results found. No images are attached to the encounter.  Labs: Lab Results  Component Value Date   HGBA1C 6.8 (H) 07/04/2023   HGBA1C 8.9 (A) 03/06/2023   HGBA1C 7.5 (A) 12/05/2022   ESRSEDRATE 115 (H) 10/23/2021   CRP  15.1 (H) 10/23/2021   REPTSTATUS 10/29/2021 FINAL 10/24/2021   REPTSTATUS 10/29/2021 FINAL 10/24/2021   GRAMSTAIN  10/24/2021    MODERATE WBC PRESENT,BOTH PMN AND MONONUCLEAR FEW GRAM POSITIVE COCCI    GRAMSTAIN  10/24/2021    RARE WBC PRESENT, PREDOMINANTLY PMN RARE GRAM POSITIVE COCCI IN CLUSTERS    CULT  10/24/2021    FEW STAPHYLOCOCCUS AUREUS NO ANAEROBES ISOLATED Performed at [facility], [address] [place], [place] [address]    CULT  10/24/2021    MODERATE STAPHYLOCOCCUS AUREUS NO ANAEROBES ISOLATED Performed at [facility], [address] [place], [place] [address]    [facility] STAPHYLOCOCCUS AUREUS 10/24/2021   LABORGA STAPHYLOCOCCUS AUREUS 10/24/2021     Lab Results  Component Value Date   ALBUMIN 5.0 (H) 09/04/2022   ALBUMIN 2.5 (L) 10/25/2021   ALBUMIN 3.4 (L) 03/22/2014    Lab Results  Component Value Date   MG 2.0 10/26/2021   MG 2.3 10/25/2021   No results found for: "VD25OH"  No results found for: "PREALBUMIN"    Latest Ref Rng & Units 07/04/2023   10:44 AM 05/16/2023   11:54 AM 05/02/2023   11:15 AM  CBC EXTENDED  WBC 4.0 - 10.5 K/uL 7.7   4.9   RBC 4.22 - 5.81 MIL/uL 3.77   4.15   Hemoglobin 13.0 - 17.0  g/dL 16.1  09.6  04.5   HCT 39.0 - 52.0 % 32.3  37.0  36.3   Platelets 150 - 400 K/uL 292   172   NEUT# 1.7 - 7.7 K/uL   2.7   Lymph# 0.7 - 4.0 K/uL   1.3      There is no height or weight on file to calculate BMI.  Orders:  No orders of the defined types were placed in this encounter.  No orders of the defined types were placed in this encounter.    Procedures: No procedures performed  Clinical Data: No additional findings.  ROS:  All other systems negative, except as noted in the HPI. Review of Systems  Objective: Vital Signs: There were no vitals taken for this visit.  Specialty Comments:  No specialty comments available.  PMFS History: Patient Active Problem List   Diagnosis Date Noted   Gangrene of  toe of left foot (HCC) 07/04/2023   Amputated toe of left foot (HCC) 07/04/2023   Alcohol intoxication with delirium (HCC) 09/04/2022   Prepatellar bursitis, left knee 11/13/2021   Infection of left knee (HCC) 10/24/2021   Cellulitis of knee 10/23/2021   Uncontrolled type 2 diabetes mellitus with hyperglycemia (HCC) 10/23/2021   Past Medical History:  Diagnosis Date   Diabetes mellitus without complication (HCC)    Hypertension    Peripheral vascular disease (HCC)     Family History  Family history unknown: Yes    Past Surgical History:  Procedure Laterality Date   ABDOMINAL AORTOGRAM W/LOWER EXTREMITY Left 05/16/2023   Procedure: ABDOMINAL AORTOGRAM W/LOWER EXTREMITY;  Surgeon: Leonie Douglas, MD;  Location: [facility] INV[facility]IVE [facility] LAB;  Service: Cardiovascular;  Laterality: Left;   AMPUTATION Left 07/04/2023   Procedure: LEFT 4TH TOE AMPUTATION;  Surgeon: Nadara Mustard, MD;  Location: [facility] OR;  Service: Orthopedics;  Laterality: Left;   I & D EXTREMITY Left 10/24/2021   Procedure: IRRIGATION AND DEBRIDEMENT OF KNEE AND EXCISION PRE-PATELLA BURSA;  Surgeon: Ernestina Columbia, MD;  Location: [facility] OR;  Service: Orthopedics;  Laterality: Left;   PERIPHERAL VASCULAR BALLOON ANGIOPLASTY Left 05/16/2023   Procedure: PERIPHERAL VASCULAR BALLOON ANGIOPLASTY;  Surgeon: Leonie Douglas, MD;  Location: [facility];  Service: Cardiovascular;  Laterality: Left;  left AT and left peroneal   Social History   Occupational History   Not on file  Tobacco Use   Smoking status: Never   Smokeless tobacco: Not on file  Vaping Use   Vaping status: Never Used  Substance and Sexual Activity   Alcohol use: Not Currently   Drug use: No   Sexual activity: Not on file

## 2023-07-10 ENCOUNTER — Encounter: Payer: Self-pay | Admitting: Orthopedic Surgery

## 2023-07-14 ENCOUNTER — Other Ambulatory Visit: Payer: Self-pay

## 2023-07-14 ENCOUNTER — Other Ambulatory Visit (HOSPITAL_COMMUNITY): Payer: Self-pay

## 2023-07-15 ENCOUNTER — Encounter (HOSPITAL_COMMUNITY): Payer: Self-pay | Admitting: Orthopedic Surgery

## 2023-07-15 ENCOUNTER — Ambulatory Visit: Payer: Self-pay | Admitting: Orthopedic Surgery

## 2023-07-15 ENCOUNTER — Other Ambulatory Visit: Payer: Self-pay

## 2023-07-15 MED ORDER — TRANEXAMIC ACID 1000 MG/10ML IV SOLN
2000.0000 mg | INTRAVENOUS | Status: DC
  Filled 2023-07-15: qty 20

## 2023-07-15 NOTE — Progress Notes (Addendum)
Anesthesia Chart Review: Patrick [name]  Case: [id number] Date/Time: 07/16/23 0815   Procedure: LEFT BELOW KNEE AMPUTATION (Left: Knee)   Anesthesia type: Choice   Pre-op diagnosis: Gangrene Left Foot   [facility]: [facility] ROOM 04 / [facility]   Surgeons: Nadara Mustard, MD       DISCUSSION: Patient is a 77 year old male scheduled for the above procedure.  History includes never smoker, HTN, hypercholesterolemia, DM2, PVD (angioplasty lleft AT & peroneal arteries 05/16/23; left 4th toe amputation for gangrene 07/04/23).  A1c 6.8% on 07/04/23.  He has been on Trulicity 0.75 mg weekly, Jardiance 25 mg daily, metformin 500 mg BID, and Humalog SSI 0-12 units TID as needed. He reported that he has not taken Trulicity in > 1 month. Last Jardiance 07/14/23. Reports fasting CBGs ~ 170-220.   Last ASA and Plavix 07/14/23. Dr. Lajoyce Corners wants him to hold until after surgery. Plavix was started after left AT and peroneal artery angioplasties in July. Nitrogylcerin patch was prescribed by Dr. Lajoyce Corners also in July to improve microvascular circulation to his LLE.   He is a same day work-up, so anesthesia team to evaluate on the day of surgery. He is Spanish speaking.   VS:  BP Readings from Last 3 Encounters:  07/05/23 (!) 153/75  06/26/23 (!) 161/101  05/16/23 (!) 156/79   Pulse Readings from Last 3 Encounters:  07/05/23 89  06/26/23 95  05/16/23 70    PROVIDERS: Pcp, No   LABS: For day of surgery as indicated. Last results in [facility] include: Lab Results  Component Value Date   WBC 7.7 07/04/2023   HGB 10.3 (L) 07/04/2023   HCT 32.3 (L) 07/04/2023   PLT 292 07/04/2023   GLUCOSE 157 (H) 07/04/2023   NA 136 07/04/2023   K 3.3 (L) 07/04/2023   CL 104 07/04/2023   CREATININE 0.70 07/04/2023   BUN 20 07/04/2023   CO2 19 (L) 07/04/2023   HGBA1C 6.8 (H) 07/04/2023    EKG: 07/04/23: NSR   CV: N/A  Past Medical History:  Diagnosis Date   Diabetes mellitus without complication (HCC)    Hypertension     Peripheral vascular disease (HCC)     Past Surgical History:  Procedure Laterality Date   ABDOMINAL AORTOGRAM W/LOWER EXTREMITY Left 05/16/2023   Procedure: ABDOMINAL AORTOGRAM W/LOWER EXTREMITY;  Surgeon: Leonie Douglas, MD;  Location[facility];  Service: Cardiovascular;  Laterality: Left;   AMPUTATION Left 07/04/2023   Procedure: LEFT 4TH TOE AMPUTATION;  Surgeon: Nadara Mustard, MD;  Location: [facility] OR;  Service: Orthopedics;  Laterality: Left;   I & D EXTREMITY Left 10/24/2021   Procedure: IRRIGATION AND DEBRIDEMENT OF KNEE AND EXCISION PRE-PATELLA BURSA;  Surgeon: Ernestina Columbia, MD;  Location: [facility] OR;  Service: Orthopedics;  Laterality: Left;   PERIPHERAL VASCULAR BALLOON ANGIOPLASTY Left 05/16/2023   Procedure: PERIPHERAL VASCULAR BALLOON ANGIOPLASTY;  Surgeon: Leonie Douglas, MD;  Location: [facility];  Service: Cardiovascular;  Laterality: Left;  left AT and left peroneal    MEDICATIONS: No current facility-administered medications for this encounter.    acetaminophen (TYLENOL) 500 MG tablet   aspirin EC 81 MG tablet   atorvastatin (LIPITOR) 80 MG tablet   clopidogrel (PLAVIX) 75 MG tablet   Dulaglutide (TRULICITY) 0.75 MG/0.5ML SOPN   empagliflozin (JARDIANCE) 25 MG TABS tablet   hydrochlorothiazide (HYDRODIURIL) 25 MG tablet   HYDROcodone-acetaminophen (NORCO/VICODIN) 5-325 MG tablet   HYDROcodone-acetaminophen (NORCO/VICODIN) 5-325 MG tablet   insulin lispro (  HUMALOG) 100 UNIT/ML injection   Insulin Pen Needle (TECHLITE PEN NEEDLES) 32G X 4 MM MISC   lisinopril (ZESTRIL) 20 MG tablet   metFORMIN (GLUCOPHAGE-XR) 500 MG 24 hr tablet   nitroGLYCERIN (NITRODUR - DOSED IN MG/24 HR) 0.2 mg/hr patch   sodium chloride (OCEAN) 0.65 % SOLN nasal spray    Shonna Chock, PA-C Surgical Short Stay/Anesthesiology [facility] Phone [phone] [facility] Phone [phone] 07/15/2023 10:20 AM

## 2023-07-15 NOTE — Anesthesia Preprocedure Evaluation (Signed)
Anesthesia Evaluation  Patient identified by MRN, date of birth, ID band Patient awake    Reviewed: Allergy & Precautions, NPO status , Patient's Chart, lab work & pertinent test results  Airway Mallampati: II  TM Distance: >3 FB Neck ROM: Full    Dental  (+) Edentulous Lower, Edentulous Upper   Pulmonary neg pulmonary ROS   Pulmonary exam normal        Cardiovascular hypertension, Pt. on medications + Peripheral Vascular Disease  Normal cardiovascular exam     Neuro/Psych  PSYCHIATRIC DISORDERS      negative neurological ROS     GI/Hepatic negative GI ROS, Neg liver ROS,,,  Endo/Other  diabetes, Insulin Dependent, Oral Hypoglycemic Agents  Patient on GLP-1 Agonist  Renal/GU negative Renal ROS     Musculoskeletal negative musculoskeletal ROS (+)    Abdominal   Peds  Hematology  (+) Blood dyscrasia (Plavix), anemia   Anesthesia Other Findings Gangrene Left Foot  Reproductive/Obstetrics                             Anesthesia Physical Anesthesia Plan  ASA: 3  Anesthesia Plan: Regional   Post-op Pain Management:    Induction: Intravenous  PONV Risk Score and Plan: 1 and Propofol infusion, Treatment may vary due to age or medical condition, Ondansetron, Dexamethasone and Midazolam  Airway Management Planned: Simple Face Mask  Additional Equipment:   Intra-op Plan:   Post-operative Plan:   Informed Consent: I have reviewed the patients History and Physical, chart, labs and discussed the procedure including the risks, benefits and alternatives for the proposed anesthesia with the patient or authorized representative who has indicated his/her understanding and acceptance.     Interpreter used for [employer] and Sales promotion account executive given  Plan Discussed with: CRNA  Anesthesia Plan Comments: (PAT note written 07/15/2023 by Shonna Chock, PA-C.  )       Anesthesia Quick  Evaluation

## 2023-07-15 NOTE — Progress Notes (Signed)
SDW call  Patient's daughter, Lissa Hoard was given pre-op instructions over the phone by use of [employer] # [phone]. She verbalized understanding of instructions provided.    PCP - Denies Cardiologist - denies Pulmonary:    PPM/ICD - denies Device Orders - n/a Rep Notified - na   Chest x-ray - n/a EKG -  07/04/2023 Stress Test - ECHO -  Cardiac Cath -   Sleep Study/sleep apnea/CPAP: denies  Type II diabetic Fasting Blood sugar range: 170-220 How often check sugars: daily Trulicity, states last dose was over a month ago. Family has not been giving it to him. Jardiance, states last dose 07/14/2023. Instructed to hold today and DOS Metformin, instructed to hold DOS Insulin Lispro, instructed zero units DOS, if blood sugar >220, use 1/2 correction dose   Blood Thinner Instructions: Plavix, last dose 07/14/2023.  Instructed to hold per Dr. Lajoyce Corners Aspirin Instructions:last dose 07/14/2023, instructed to hold per Dr. Lajoyce Corners   ERAS Protcol - Yes, clear fluids until 0530   COVID TEST- n/a   Anesthesia review: Yes.  HTN, PVD, DM, on GLP1   Patient denies shortness of breath, fever, cough and chest pain over the phone call  Your procedure is scheduled on Wednesday July 16, 2023  Report to [facility] Main Entrance "A" at  0630  A.M., then check in with the Admitting office.  Call this number if you have problems the morning of surgery:  [phone]   If you have any questions prior to your surgery date call [phone]: Open Monday-Friday 8am-4pm If you experience any cold or flu symptoms such as cough, fever, chills, shortness of breath, etc. between now and your scheduled surgery, please notify us at the above number     Remember:  Do not eat after midnight the night before your surgery  You may drink clear liquids until  0530   the morning of your surgery.   Clear liquids allowed are: Water, Non-Citrus Juices (without pulp), Carbonated Beverages, Clear Tea, Black Coffee  ONLY (NO MILK, CREAM OR POWDERED CREAMER of any kind), and Gatorade   Take these medicines as needed the morning of surgery with A SIP OF WATER:  Tylenor or Norco  As of today, STOP taking any Aspirin (unless otherwise instructed by your surgeon) Aleve, Naproxen, Ibuprofen, Motrin, Advil, Goody's, BC's, all herbal medications, fish oil, and all vitamins.

## 2023-07-16 ENCOUNTER — Other Ambulatory Visit: Payer: Self-pay

## 2023-07-16 ENCOUNTER — Inpatient Hospital Stay (HOSPITAL_COMMUNITY): Payer: Self-pay | Admitting: Vascular Surgery

## 2023-07-16 ENCOUNTER — Inpatient Hospital Stay (HOSPITAL_COMMUNITY)
Admission: RE | Admit: 2023-07-16 | Discharge: 2023-07-23 | DRG: 475 | Disposition: A | Discharge status: Rehabilitation Facility | Payer: Self-pay | Attending: Orthopedic Surgery | Admitting: Orthopedic Surgery

## 2023-07-16 ENCOUNTER — Encounter (HOSPITAL_COMMUNITY)
Admission: RE | Disposition: A | Discharge status: Rehabilitation Facility | Payer: Self-pay | Source: Home / Self Care | Attending: Orthopedic Surgery

## 2023-07-16 ENCOUNTER — Encounter (HOSPITAL_COMMUNITY): Payer: Self-pay | Admitting: Orthopedic Surgery

## 2023-07-16 DIAGNOSIS — Z7984 Long term (current) use of oral hypoglycemic drugs: Secondary | ICD-10-CM

## 2023-07-16 DIAGNOSIS — I10 Essential (primary) hypertension: Secondary | ICD-10-CM | POA: Diagnosis present

## 2023-07-16 DIAGNOSIS — T8754 Necrosis of amputation stump, left lower extremity: Principal | ICD-10-CM | POA: Diagnosis present

## 2023-07-16 DIAGNOSIS — E1152 Type 2 diabetes mellitus with diabetic peripheral angiopathy with gangrene: Secondary | ICD-10-CM | POA: Diagnosis present

## 2023-07-16 DIAGNOSIS — Y835 Amputation of limb(s) as the cause of abnormal reaction of the patient, or of later complication, without mention of misadventure at the time of the procedure: Secondary | ICD-10-CM | POA: Diagnosis present

## 2023-07-16 DIAGNOSIS — I96 Gangrene, not elsewhere classified: Secondary | ICD-10-CM | POA: Diagnosis present

## 2023-07-16 DIAGNOSIS — Z89422 Acquired absence of other left toe(s): Secondary | ICD-10-CM

## 2023-07-16 DIAGNOSIS — Z7985 Long-term (current) use of injectable non-insulin antidiabetic drugs: Secondary | ICD-10-CM

## 2023-07-16 DIAGNOSIS — Z89512 Acquired absence of left leg below knee: Principal | ICD-10-CM

## 2023-07-16 DIAGNOSIS — Z794 Long term (current) use of insulin: Secondary | ICD-10-CM

## 2023-07-16 HISTORY — DX: Alcohol abuse, in remission: F10.11

## 2023-07-16 HISTORY — PX: APPLICATION OF WOUND VAC: SHX5189

## 2023-07-16 HISTORY — PX: AMPUTATION: SHX166

## 2023-07-16 LAB — CBC
HCT: 32.9 % — ABNORMAL LOW (ref 39.0–52.0)
Hemoglobin: 10.4 g/dL — ABNORMAL LOW (ref 13.0–17.0)
MCH: 26.4 pg (ref 26.0–34.0)
MCHC: 31.6 g/dL (ref 30.0–36.0)
MCV: 83.5 fL (ref 80.0–100.0)
Platelets: 328 10*3/uL (ref 150–400)
RBC: 3.94 MIL/uL — ABNORMAL LOW (ref 4.22–5.81)
RDW: 14.4 % (ref 11.5–15.5)
WBC: 8 10*3/uL (ref 4.0–10.5)
nRBC: 0 % (ref 0.0–0.2)

## 2023-07-16 LAB — BASIC METABOLIC PANEL WITH GFR
Anion gap: 16 — ABNORMAL HIGH (ref 5–15)
BUN: 12 mg/dL (ref 8–23)
CO2: 22 mmol/L (ref 22–32)
Calcium: 8.9 mg/dL (ref 8.9–10.3)
Chloride: 93 mmol/L — ABNORMAL LOW (ref 98–111)
Creatinine, Ser: 0.53 mg/dL — ABNORMAL LOW (ref 0.61–1.24)
GFR, Estimated: >60 mL/min (ref >60)
Glucose, Bld: 138 mg/dL — ABNORMAL HIGH (ref 70–99)
Potassium: 4.2 mmol/L (ref 3.5–5.1)
Sodium: 131 mmol/L — ABNORMAL LOW (ref 135–145)

## 2023-07-16 LAB — GLUCOSE, CAPILLARY
Glucose-Capillary: 149 mg/dL — ABNORMAL HIGH (ref 70–99)
Glucose-Capillary: 130 mg/dL — ABNORMAL HIGH (ref 70–99)
Glucose-Capillary: 103 mg/dL — ABNORMAL HIGH (ref 70–99)
Glucose-Capillary: 127 mg/dL — ABNORMAL HIGH (ref 70–99)
Glucose-Capillary: 165 mg/dL — ABNORMAL HIGH (ref 70–99)

## 2023-07-16 SURGERY — AMPUTATION BELOW KNEE
Anesthesia: Regional | Site: Knee | Laterality: Left

## 2023-07-16 MED ORDER — OXYCODONE HCL 5 MG PO TABS
5.0000 mg | ORAL_TABLET | ORAL | Status: DC | PRN
  Administered 2023-07-17 – 2023-07-18 (×3): 10 mg via ORAL
  Administered 2023-07-18: 5 mg via ORAL
  Administered 2023-07-18 – 2023-07-19 (×2): 10 mg via ORAL
  Administered 2023-07-20: 5 mg via ORAL
  Administered 2023-07-20 – 2023-07-21 (×3): 10 mg via ORAL
  Filled 2023-07-16 (×6): qty 2
  Filled 2023-07-16: qty 1
  Filled 2023-07-16 (×2): qty 2
  Filled 2023-07-16: qty 1

## 2023-07-16 MED ORDER — MAGNESIUM SULFATE 2 GM/50ML IV SOLN: 2.0000 g | Freq: Every day | INTRAVENOUS | Status: DC | PRN

## 2023-07-16 MED ORDER — INSULIN ASPART 100 UNIT/ML IJ SOLN: 0.0000 [IU] | INTRAMUSCULAR | Status: DC | PRN

## 2023-07-16 MED ORDER — ORAL CARE MOUTH RINSE: 15.0000 mL | Freq: Once | OROMUCOSAL | Status: AC

## 2023-07-16 MED ORDER — DOCUSATE SODIUM 100 MG PO CAPS
100.0000 mg | ORAL_CAPSULE | Freq: Every day | ORAL | Status: DC
  Administered 2023-07-17 – 2023-07-23 (×7): 100 mg via ORAL
  Filled 2023-07-16 (×7): qty 1

## 2023-07-16 MED ORDER — MIDAZOLAM HCL 2 MG/2ML IJ SOLN
INTRAMUSCULAR | Status: AC
  Filled 2023-07-16: qty 2

## 2023-07-16 MED ORDER — MAGNESIUM CITRATE PO SOLN: 1.0000 | Freq: Once | ORAL | Status: DC | PRN

## 2023-07-16 MED ORDER — CHLORHEXIDINE GLUCONATE 0.12 % MT SOLN
OROMUCOSAL | Status: AC
  Filled 2023-07-16: qty 15

## 2023-07-16 MED ORDER — ACETAMINOPHEN 325 MG PO TABS
325.0000 mg | ORAL_TABLET | Freq: Four times a day (QID) | ORAL | Status: DC | PRN
  Administered 2023-07-20 – 2023-07-22 (×2): 650 mg via ORAL
  Filled 2023-07-16 (×2): qty 2

## 2023-07-16 MED ORDER — 0.9 % SODIUM CHLORIDE (POUR BTL) OPTIME
TOPICAL | Status: DC | PRN
Start: 2023-07-16 — End: 2023-07-16
  Administered 2023-07-16: 1000 mL

## 2023-07-16 MED ORDER — POLYETHYLENE GLYCOL 3350 17 G PO PACK
17.0000 g | PACK | Freq: Every day | ORAL | Status: DC | PRN
  Administered 2023-07-18 – 2023-07-23 (×2): 17 g via ORAL
  Filled 2023-07-16 (×2): qty 1

## 2023-07-16 MED ORDER — PANTOPRAZOLE SODIUM 40 MG PO TBEC
40.0000 mg | DELAYED_RELEASE_TABLET | Freq: Every day | ORAL | Status: DC
  Administered 2023-07-16 – 2023-07-23 (×8): 40 mg via ORAL
  Filled 2023-07-16 (×8): qty 1

## 2023-07-16 MED ORDER — ATORVASTATIN CALCIUM 80 MG PO TABS
80.0000 mg | ORAL_TABLET | Freq: Every day | ORAL | Status: DC
  Administered 2023-07-16 – 2023-07-23 (×8): 80 mg via ORAL
  Filled 2023-07-16 (×8): qty 1

## 2023-07-16 MED ORDER — POTASSIUM CHLORIDE CRYS ER 20 MEQ PO TBCR: 20.0000 meq | EXTENDED_RELEASE_TABLET | Freq: Every day | ORAL | Status: DC | PRN

## 2023-07-16 MED ORDER — INSULIN ASPART 100 UNIT/ML IJ SOLN
4.0000 [IU] | Freq: Three times a day (TID) | INTRAMUSCULAR | Status: DC
  Administered 2023-07-16 – 2023-07-23 (×19): 4 [IU] via SUBCUTANEOUS

## 2023-07-16 MED ORDER — LACTATED RINGERS IV SOLN: INTRAVENOUS | Status: DC

## 2023-07-16 MED ORDER — FENTANYL CITRATE (PF) 100 MCG/2ML IJ SOLN
INTRAMUSCULAR | Status: DC | PRN
  Administered 2023-07-16: 100 ug via INTRAVENOUS

## 2023-07-16 MED ORDER — TRANEXAMIC ACID-NACL 1000-0.7 MG/100ML-% IV SOLN
1000.0000 mg | INTRAVENOUS | Status: AC
  Administered 2023-07-16: 1000 mg via INTRAVENOUS
  Filled 2023-07-16: qty 100

## 2023-07-16 MED ORDER — GUAIFENESIN-DM 100-10 MG/5ML PO SYRP: 15.0000 mL | ORAL_SOLUTION | ORAL | Status: DC | PRN

## 2023-07-16 MED ORDER — CHLORHEXIDINE GLUCONATE 0.12 % MT SOLN
15.0000 mL | Freq: Once | OROMUCOSAL | Status: AC
  Administered 2023-07-16: 15 mL via OROMUCOSAL

## 2023-07-16 MED ORDER — JUVEN PO PACK
1.0000 | PACK | Freq: Two times a day (BID) | ORAL | Status: DC
  Administered 2023-07-16 – 2023-07-23 (×15): 1 via ORAL
  Filled 2023-07-16 (×15): qty 1

## 2023-07-16 MED ORDER — HYDROCHLOROTHIAZIDE 25 MG PO TABS
25.0000 mg | ORAL_TABLET | Freq: Every day | ORAL | Status: DC
  Administered 2023-07-16 – 2023-07-23 (×8): 25 mg via ORAL
  Filled 2023-07-16 (×8): qty 1

## 2023-07-16 MED ORDER — BUPIVACAINE HCL (PF) 0.25 % IJ SOLN
INTRAMUSCULAR | Status: DC | PRN
Start: 2023-07-16 — End: 2023-07-16
  Administered 2023-07-16: 15 mL via PERINEURAL

## 2023-07-16 MED ORDER — FENTANYL CITRATE (PF) 250 MCG/5ML IJ SOLN
INTRAMUSCULAR | Status: AC
  Filled 2023-07-16: qty 5

## 2023-07-16 MED ORDER — FENTANYL CITRATE (PF) 100 MCG/2ML IJ SOLN
INTRAMUSCULAR | Status: AC
  Filled 2023-07-16: qty 2

## 2023-07-16 MED ORDER — INSULIN ASPART 100 UNIT/ML IJ SOLN
0.0000 [IU] | Freq: Three times a day (TID) | INTRAMUSCULAR | Status: DC
  Administered 2023-07-16 – 2023-07-18 (×5): 2 [IU] via SUBCUTANEOUS
  Administered 2023-07-18: 3 [IU] via SUBCUTANEOUS
  Administered 2023-07-18 – 2023-07-21 (×6): 2 [IU] via SUBCUTANEOUS
  Administered 2023-07-21: 3 [IU] via SUBCUTANEOUS
  Administered 2023-07-23: 5 [IU] via SUBCUTANEOUS

## 2023-07-16 MED ORDER — VASOPRESSIN 20 UNIT/ML IV SOLN
INTRAVENOUS | Status: AC
  Filled 2023-07-16: qty 1

## 2023-07-16 MED ORDER — CLOPIDOGREL BISULFATE 75 MG PO TABS
75.0000 mg | ORAL_TABLET | Freq: Every day | ORAL | Status: DC
  Administered 2023-07-16 – 2023-07-23 (×8): 75 mg via ORAL
  Filled 2023-07-16 (×8): qty 1

## 2023-07-16 MED ORDER — ASPIRIN 81 MG PO TBEC
81.0000 mg | DELAYED_RELEASE_TABLET | Freq: Every day | ORAL | Status: DC
  Administered 2023-07-16 – 2023-07-23 (×8): 81 mg via ORAL
  Filled 2023-07-16 (×8): qty 1

## 2023-07-16 MED ORDER — MIDAZOLAM HCL 2 MG/2ML IJ SOLN
INTRAMUSCULAR | Status: DC | PRN
  Administered 2023-07-16: 2 mg via INTRAVENOUS

## 2023-07-16 MED ORDER — BISACODYL 5 MG PO TBEC
5.0000 mg | DELAYED_RELEASE_TABLET | Freq: Every day | ORAL | Status: DC | PRN
  Administered 2023-07-20 – 2023-07-23 (×2): 5 mg via ORAL
  Filled 2023-07-16 (×2): qty 1

## 2023-07-16 MED ORDER — HYDRALAZINE HCL 20 MG/ML IJ SOLN
5.0000 mg | INTRAMUSCULAR | Status: DC | PRN
  Administered 2023-07-16: 5 mg via INTRAVENOUS
  Filled 2023-07-16: qty 1

## 2023-07-16 MED ORDER — SODIUM CHLORIDE 0.9 % IV SOLN: INTRAVENOUS | Status: DC

## 2023-07-16 MED ORDER — BUPIVACAINE HCL (PF) 0.5 % IJ SOLN
INTRAMUSCULAR | Status: DC | PRN
Start: 2023-07-16 — End: 2023-07-16
  Administered 2023-07-16: 25 mL via PERINEURAL

## 2023-07-16 MED ORDER — CEFAZOLIN SODIUM-DEXTROSE 2-4 GM/100ML-% IV SOLN
2.0000 g | INTRAVENOUS | Status: AC
  Administered 2023-07-16: 2 g via INTRAVENOUS
  Filled 2023-07-16: qty 100

## 2023-07-16 MED ORDER — ZINC SULFATE 220 (50 ZN) MG PO CAPS
220.0000 mg | ORAL_CAPSULE | Freq: Every day | ORAL | Status: DC
  Administered 2023-07-16 – 2023-07-23 (×8): 220 mg via ORAL
  Filled 2023-07-16 (×8): qty 1

## 2023-07-16 MED ORDER — ONDANSETRON HCL 4 MG/2ML IJ SOLN
INTRAMUSCULAR | Status: DC | PRN
  Administered 2023-07-16: 4 mg via INTRAVENOUS

## 2023-07-16 MED ORDER — PROPOFOL 500 MG/50ML IV EMUL
INTRAVENOUS | Status: DC | PRN
  Administered 2023-07-16: 100 ug/kg/min via INTRAVENOUS

## 2023-07-16 MED ORDER — CEFAZOLIN SODIUM-DEXTROSE 2-4 GM/100ML-% IV SOLN
2.0000 g | Freq: Three times a day (TID) | INTRAVENOUS | Status: AC
  Administered 2023-07-16 (×2): 2 g via INTRAVENOUS
  Filled 2023-07-16 (×2): qty 100

## 2023-07-16 MED ORDER — ALUM & MAG HYDROXIDE-SIMETH 200-200-20 MG/5ML PO SUSP: 15.0000 mL | ORAL | Status: DC | PRN

## 2023-07-16 MED ORDER — LABETALOL HCL 5 MG/ML IV SOLN: 10.0000 mg | INTRAVENOUS | Status: DC | PRN

## 2023-07-16 MED ORDER — OXYCODONE HCL 5 MG PO TABS
10.0000 mg | ORAL_TABLET | ORAL | Status: DC | PRN
  Administered 2023-07-20 – 2023-07-22 (×4): 15 mg via ORAL
  Administered 2023-07-22: 10 mg via ORAL
  Administered 2023-07-23: 15 mg via ORAL
  Filled 2023-07-16 (×2): qty 3
  Filled 2023-07-16: qty 2
  Filled 2023-07-16 (×3): qty 3

## 2023-07-16 MED ORDER — EMPAGLIFLOZIN 25 MG PO TABS
25.0000 mg | ORAL_TABLET | Freq: Every day | ORAL | Status: DC
  Administered 2023-07-17 – 2023-07-23 (×7): 25 mg via ORAL
  Filled 2023-07-16 (×7): qty 1

## 2023-07-16 MED ORDER — METOPROLOL TARTRATE 5 MG/5ML IV SOLN: 2.0000 mg | INTRAVENOUS | Status: DC | PRN

## 2023-07-16 MED ORDER — PHENOL 1.4 % MT LIQD: 1.0000 | OROMUCOSAL | Status: DC | PRN

## 2023-07-16 MED ORDER — METFORMIN HCL ER 500 MG PO TB24
500.0000 mg | ORAL_TABLET | Freq: Two times a day (BID) | ORAL | Status: DC
  Administered 2023-07-16 – 2023-07-23 (×15): 500 mg via ORAL
  Filled 2023-07-16 (×16): qty 1

## 2023-07-16 MED ORDER — ONDANSETRON HCL 4 MG/2ML IJ SOLN: 4.0000 mg | Freq: Four times a day (QID) | INTRAMUSCULAR | Status: DC | PRN

## 2023-07-16 MED ORDER — VITAMIN C 500 MG PO TABS
1000.0000 mg | ORAL_TABLET | Freq: Every day | ORAL | Status: DC
  Administered 2023-07-16 – 2023-07-23 (×8): 1000 mg via ORAL
  Filled 2023-07-16 (×8): qty 2

## 2023-07-16 MED ORDER — HYDROMORPHONE HCL 1 MG/ML IJ SOLN: 0.5000 mg | INTRAMUSCULAR | Status: DC | PRN

## 2023-07-16 MED ORDER — LISINOPRIL 20 MG PO TABS
20.0000 mg | ORAL_TABLET | Freq: Every day | ORAL | Status: DC
  Administered 2023-07-16 – 2023-07-23 (×8): 20 mg via ORAL
  Filled 2023-07-16 (×8): qty 1

## 2023-07-16 SURGICAL SUPPLY — 38 items
BAG COUNTER SPONGE SURGICOUNT (BAG) IMPLANT
BAG SPNG CNTER NS LX DISP (BAG)
BLADE SAW RECIP 87.9 MT (BLADE) ×1 IMPLANT
BLADE SURG 21 STRL SS (BLADE) ×1 IMPLANT
BNDG CMPR 5X6 CHSV STRCH STRL (GAUZE/BANDAGES/DRESSINGS)
BNDG COHESIVE 6X5 TAN ST LF (GAUZE/BANDAGES/DRESSINGS) IMPLANT
CANISTER WOUND CARE 500ML ATS (WOUND CARE) ×1 IMPLANT
COVER SURGICAL LIGHT HANDLE (MISCELLANEOUS) ×1 IMPLANT
CUFF TOURN SGL QUICK 34 (TOURNIQUET CUFF) ×1
CUFF TRNQT CYL 34X4.125X (TOURNIQUET CUFF) ×1 IMPLANT
DRAPE INCISE IOBAN 66X45 STRL (DRAPES) ×1 IMPLANT
DRAPE U-SHAPE 47X51 STRL (DRAPES) ×1 IMPLANT
DRESSING PREVENA PLUS CUSTOM (GAUZE/BANDAGES/DRESSINGS) ×1 IMPLANT
DRSG PREVENA PLUS CUSTOM (GAUZE/BANDAGES/DRESSINGS) ×1
DURAPREP 26ML APPLICATOR (WOUND CARE) ×1 IMPLANT
ELECT REM PT RETURN 9FT ADLT (ELECTROSURGICAL) ×1
ELECTRODE REM PT RTRN 9FT ADLT (ELECTROSURGICAL) ×1 IMPLANT
GLOVE BIOGEL PI IND STRL 9 (GLOVE) ×1 IMPLANT
GLOVE SURG ORTHO 9.0 STRL STRW (GLOVE) ×1 IMPLANT
GOWN STRL REUS W/ TWL XL LVL3 (GOWN DISPOSABLE) ×2 IMPLANT
GOWN STRL REUS W/TWL XL LVL3 (GOWN DISPOSABLE) ×2
KIT BASIN OR (CUSTOM PROCEDURE TRAY) ×1 IMPLANT
KIT TURNOVER KIT B (KITS) ×1 IMPLANT
MANIFOLD NEPTUNE II (INSTRUMENTS) ×1 IMPLANT
NS IRRIG 1000ML POUR BTL (IV SOLUTION) ×1 IMPLANT
PACK ORTHO EXTREMITY (CUSTOM PROCEDURE TRAY) ×1 IMPLANT
PAD ARMBOARD 7.5X6 YLW CONV (MISCELLANEOUS) ×1 IMPLANT
PREVENA RESTOR ARTHOFORM 46X30 (CANNISTER) ×1 IMPLANT
SPONGE T-LAP 18X18 ~~LOC~~+RFID (SPONGE) IMPLANT
STAPLER VISISTAT 35W (STAPLE) IMPLANT
STOCKINETTE IMPERVIOUS LG (DRAPES) ×1 IMPLANT
SUT ETHILON 2 0 PSLX (SUTURE) IMPLANT
SUT SILK 2 0 (SUTURE) ×1
SUT SILK 2-0 18XBRD TIE 12 (SUTURE) ×1 IMPLANT
SUT VIC AB 1 CTX 27 (SUTURE) ×2 IMPLANT
TOWEL GREEN STERILE (TOWEL DISPOSABLE) ×1 IMPLANT
TUBE CONNECTING 12X1/4 (SUCTIONS) ×1 IMPLANT
YANKAUER SUCT BULB TIP NO VENT (SUCTIONS) ×1 IMPLANT

## 2023-07-16 NOTE — H&P (Signed)
Patrick [name] is an 77 y.o. male.   Chief Complaint: Ischemic pain left foot HPI: Patient is a 78 year old gentleman who presents 4 days status post left foot fourth ray amputation status post endovascular revascularization with vascular vein surgery. Patient states he is having severe pain states he cannot elevate his leg. Stated that he could not use the nitroglycerin patch.   Past Medical History:  Diagnosis Date   Diabetes mellitus without complication (HCC)    Hypertension    Peripheral vascular disease [facility])     Past Surgical History:  Procedure Laterality Date   ABDOMINAL AORTOGRAM W/LOWER EXTREMITY Left 05/16/2023   Procedure: ABDOMINAL AORTOGRAM W/LOWER EXTREMITY;  Surgeon: Leonie Douglas, MD;  Location: [facility] INVASIVE CV LAB;  Service: Cardiovascular;  Laterality: Left;   AMPUTATION Left 07/04/2023   Procedure: LEFT 4TH TOE AMPUTATION;  Surgeon: Nadara Mustard, MD;  Location: [facility] OR;  Service: Orthopedics;  Laterality: Left;   I & D EXTREMITY Left 10/24/2021   Procedure: IRRIGATION AND DEBRIDEMENT OF KNEE AND EXCISION PRE-PATELLA BURSA;  Surgeon: Ernestina Columbia, MD;  Location: [facility] OR;  Service: Orthopedics;  Laterality: Left;   PERIPHERAL VASCULAR BALLOON ANGIOPLASTY Left 05/16/2023   Procedure: PERIPHERAL VASCULAR BALLOON ANGIOPLASTY;  Surgeon: Leonie Douglas, MD;  Location: [facility] INVASIVE CV LAB;  Service: Cardiovascular;  Laterality: Left;  left AT and left peroneal    Family History  Family history unknown: Yes   Social History:  reports that he has never smoked. He does not have any smokeless tobacco history on file. He reports that he does not currently use alcohol. He reports that he does not use drugs.  Allergies: No Known Allergies  No medications prior to admission.    No results found for this or any previous visit (from the past 48 hour(s)). No results found.  Review of Systems  All other systems reviewed and are negative.   There were no vitals taken for  this visit. Physical Exam  Patient is alert, oriented, no adenopathy, well-dressed, normal affect, normal respiratory effort. Examination patient is 4 days status post left foot fourth ray amputation.  There is ischemic black gangrenous changes over the dorsum of the left foot as well as plantar aspect of the forefoot.  These are new acute gangrenous changes.  The Doppler was used and he does have a strong monophasic dorsalis pedis pulse he still has inline flow to the ankle. Assessment/Plan 1. PAD (peripheral artery disease) (HCC)   2. Gangrene of left foot (HCC)       Plan: With the rapidly progressive gangrenous changes to the left foot discussed that his only option would be to proceed with a transtibial amputation.  Risks and benefits were discussed including risk of the wound not healing need for additional surgery.  Patient states he understands wished to proceed at this time.  Nadara Mustard, MD 07/16/2023, 6:35 AM

## 2023-07-16 NOTE — Anesthesia Procedure Notes (Signed)
Anesthesia Regional Block: Popliteal block   Pre-Anesthetic Checklist: , timeout performed,  Correct Patient, Correct Site, Correct Laterality,  Correct Procedure, Correct Position, site marked,  Risks and benefits discussed,  Surgical consent,  Pre-op evaluation,  At surgeon's request and post-op pain management  Laterality: Left  Prep: chloraprep       Needles:  Injection technique: Single-shot  Needle Type: Echogenic Stimulator Needle     Needle Length: 10cm  Needle Gauge: 20     Additional Needles:   Procedures:,,,, ultrasound used (permanent image in chart),,    Narrative:  Start time: 07/16/2023 8:00 AM End time: 07/16/2023 8:10 AM Injection made incrementally with aspirations every 5 mL.  Performed by: Personally  Anesthesiologist: Leonides Grills, MD  Additional Notes: Functioning IV was confirmed and monitors were applied.  A timeout was performed. Sterile prep, hand hygiene and sterile gloves were used. A  20ga Bbraun echogenic stimulator needle was used. Negative aspiration and negative test dose prior to incremental administration of local anesthetic. The patient tolerated the procedure well.  Ultrasound guidance: relevent anatomy identified, needle position confirmed, local anesthetic spread visualized around nerve(s), vascular puncture avoided.  Image printed for medical record.

## 2023-07-16 NOTE — Op Note (Signed)
07/16/2023  9:15 AM  PATIENT:  Patrick [name]    PRE-OPERATIVE DIAGNOSIS:  Gangrene Left Foot  POST-OPERATIVE DIAGNOSIS:  Same  PROCEDURE:  LEFT BELOW KNEE AMPUTATION, APPLICATION OF WOUND VAC  Application of Vive Wear stump shrinker  SURGEON:  Nadara Mustard, MD  ANESTHESIA:   General  PREOPERATIVE INDICATIONS:  [name] is a  77 y.o. male with a diagnosis of Gangrene Left Foot who failed conservative measures and elected for surgical management.    The risks benefits and alternatives were discussed with the patient preoperatively including but not limited to the risks of infection, bleeding, nerve injury, cardiopulmonary complications, the need for revision surgery, among others, and the patient was willing to proceed.  OPERATIVE IMPLANTS:   * No implants in log *   OPERATIVE FINDINGS: arteries calcified, muscle healthy  OPERATIVE PROCEDURE: Patient was brought to the operating room after undergoing a regional anesthetic.  After adequate levels anesthesia were obtained a thigh tourniquet was placed and the lower extremity was prepped using DuraPrep draped into a sterile field. The foot was draped out of the sterile field with impervious stockinette.  A timeout was called and the tourniquet inflated.  A transverse skin incision was made 12 cm distal to the tibial tubercle, the incision curved proximally, and a large posterior flap was created.  The tibia was transected just proximal to the skin incision and beveled anteriorly.  The fibula was transected just proximal to the tibial incision.  The sciatic nerve was pulled cut and allowed to retract.  The vascular bundles were suture ligated with 2-0 silk.  The tourniquet was deflated and hemostasis obtained.     The deep and superficial fascial layers were closed using #1 Vicryl.  The skin was closed using staples.    The Prevena customizable dressing was applied this was overwrapped with the arthroform sponge.  Collier Flowers  was used to secure the sponges and the circumferential compression was secured to the skin with Dermatac.  This was connected to the wound VAC pump and had a good suction fit this was covered with a stump shrinker.  Patient was taken to the PACU in stable condition.   DISCHARGE PLANNING:  Antibiotic duration: 24-hour antibiotics  Weightbearing: Nonweightbearing on the operative extremity  Pain medication: Opioid pathway  Dressing care/ Wound VAC: Continue wound VAC with the Prevena plus pump at discharge for 1 week  Ambulatory devices: Walker or kneeling scooter  Discharge to: Discharge planning based on recommendations per physical therapy  Follow-up: In the office 1 week after discharge.

## 2023-07-16 NOTE — Transfer of Care (Signed)
Immediate Anesthesia Transfer of Care Note  Patient: Patrick [name]  Procedure(s) Performed: LEFT BELOW KNEE AMPUTATION (Left: Knee) APPLICATION OF WOUND VAC (Left: Knee)  Patient Location: PACU  Anesthesia Type:MAC and Regional  Level of Consciousness: drowsy  Airway & Oxygen Therapy: Patient connected to face mask oxygen  Post-op Assessment: Report given to RN and Post -op Vital signs reviewed and stable  Post vital signs: Reviewed and stable  Last Vitals:  Vitals Value Taken Time  BP 121/72 07/16/23 0915  Temp    Pulse 60 07/16/23 0916  Resp    SpO2 100 % 07/16/23 0916  Vitals shown include unfiled device data.  Last Pain:  Vitals:   07/16/23 0744  TempSrc:   PainSc: 10-Worst pain ever         Complications: There were no known notable events for this encounter.

## 2023-07-16 NOTE — Anesthesia Procedure Notes (Signed)
Anesthesia Regional Block: Adductor canal block   Pre-Anesthetic Checklist: , timeout performed,  Correct Patient, Correct Site, Correct Laterality,  Correct Procedure,, site marked,  Risks and benefits discussed,  Surgical consent,  Pre-op evaluation,  At surgeon's request and post-op pain management  Laterality: Left  Prep: chloraprep       Needles:  Injection technique: Single-shot  Needle Type: Echogenic Stimulator Needle     Needle Length: 9cm  Needle Gauge: 21     Additional Needles:   Procedures:,,,, ultrasound used (permanent image in chart),,    Narrative:  Start time: 07/16/2023 8:10 AM End time: 07/16/2023 8:20 AM Injection made incrementally with aspirations every 5 mL.  Performed by: Personally  Anesthesiologist: Leonides Grills, MD  Additional Notes: Functioning IV was confirmed and monitors were applied. A time-out was performed. Hand hygiene and sterile gloves were used. The thigh was placed in a frog-leg position and prepped in a sterile fashion. A 90mm 21ga Arrow echogenic stimulator needle was placed using ultrasound guidance.  Negative aspiration and negative test dose prior to incremental administration of local anesthetic. The patient tolerated the procedure well.

## 2023-07-17 ENCOUNTER — Encounter (HOSPITAL_COMMUNITY): Payer: Self-pay | Admitting: Orthopedic Surgery

## 2023-07-17 LAB — GLUCOSE, CAPILLARY
Glucose-Capillary: 129 mg/dL — ABNORMAL HIGH (ref 70–99)
Glucose-Capillary: 133 mg/dL — ABNORMAL HIGH (ref 70–99)
Glucose-Capillary: 128 mg/dL — ABNORMAL HIGH (ref 70–99)
Glucose-Capillary: 106 mg/dL — ABNORMAL HIGH (ref 70–99)

## 2023-07-17 LAB — SURGICAL PATHOLOGY

## 2023-07-17 NOTE — Evaluation (Signed)
Occupational Therapy Evaluation Patient Details Name: Patrick [name] MRN: [id number] DOB: [birth year] Today's Date: 07/17/2023   History of Present Illness Pt is a 77 y.o. 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 PMH significant for DM, HTN, PVD.   Clinical Impression   Pt is typically independent. Has been needing help to hop with RW with wife, for ADLs and IADLs since he became NWB after previous toe amputation. Pt presents with generalized weakness and impaired balance. Pt transfers with min assist and needs set up to max assist for ADLs. Began educating pt and daughter in compensatory strategies for ADLs leaning side to side, use of 3 in 1 and tub transfer bench. Patient will benefit from intensive inpatient follow up therapy, >3 hours/day, but family may opt to take pt home.       If plan is discharge home, recommend the following: A little help with walking and/or transfers;A lot of help with bathing/dressing/bathroom;Assistance with cooking/housework;Assist for transportation;Help with stairs or ramp for entrance    Functional Status Assessment  Patient has had a recent decline in their functional status and demonstrates the ability to make significant improvements in function in a reasonable and predictable amount of time.  Equipment Recommendations  BSC/3in1;Wheelchair (measurements OT);Wheelchair cushion (measurements OT)    Recommendations for Other Services       Precautions / Restrictions Precautions Precautions: Fall Precaution Comments: wound vac L residual limb Restrictions Weight Bearing Restrictions: Yes LLE Weight Bearing: Non weight bearing      Mobility Bed Mobility               General bed mobility comments: in chair    Transfers Overall transfer level: Needs assistance Equipment used: Rolling walker (2 wheels) Transfers: Sit to/from Stand, Bed to  chair/wheelchair/BSC Sit to Stand: Min assist           General transfer comment: cues for technique, assist to rise and steady      Balance Overall balance assessment: Needs assistance   Sitting balance-Leahy Scale: Fair     Standing balance support: Bilateral upper extremity supported, During functional activity, Reliant on assistive device for balance Standing balance-Leahy Scale: Poor Standing balance comment: Reliant on RW and min assist                           ADL either performed or assessed with clinical judgement   ADL Overall ADL's : Needs assistance/impaired Eating/Feeding: Independent;Sitting   Grooming: Set up;Sitting   Upper Body Bathing: Supervision/ safety;Sitting   Lower Body Bathing: Moderate assistance;Sitting/lateral leans   Upper Body Dressing : Supervision/safety;Sitting   Lower Body Dressing: Moderate assistance;Sitting/lateral leans   Toilet Transfer: Minimal assistance;Stand-pivot;BSC/3in1;Rolling walker (2 wheels)             General ADL Comments: Educated pt and daughter in completing bathing and dressing in sitting, leaning side to side, benefits of 3 in 1 and tub transfer bench.     Vision Ability to See in Adequate Light: 0 Adequate Patient Visual Report: No change from baseline       Perception         Praxis         Pertinent Vitals/Pain Pain Assessment Pain Assessment: Faces Faces Pain Scale: Hurts even more Pain Location: L LE Pain Descriptors / Indicators: Aching Pain Intervention(s): Patient requesting pain meds-RN notified  Extremity/Trunk Assessment Upper Extremity Assessment Upper Extremity Assessment: Overall WFL for tasks assessed   Lower Extremity Assessment Lower Extremity Assessment: Defer to PT evaluation LLE Deficits / Details: s/p BKA 07/16/23 with wound vac attached; [facility] AROM   Cervical / Trunk Assessment Cervical / Trunk Assessment: Normal   Communication  Communication Communication: Other (comment) (daughter interpreted)   Cognition Arousal: Alert Behavior During Therapy: WFL for tasks assessed/performed Overall Cognitive Status: Difficult to assess                                       General Comments  encouraged AROM of L leg and elevating leg at rest; discussed PT options, AIR vs OP and pt and daughter prefer to pursue AIR at this time    Exercises     Shoulder Instructions      Home Living Family/patient expects to be discharged to:: Private residence Living Arrangements: Spouse/significant other;Children (daughter) Available Help at Discharge: Family;Available 24 hours/day Type of Home: House Home Access: Stairs to enter [employer] of Steps: 5 Entrance Stairs-Rails: None Home Layout: One level     Bathroom Shower/Tub: Chief Strategy Officer: Standard Bathroom Accessibility: Yes (to RW)   Home Equipment: Agricultural consultant (2 wheels);Crutches          Prior Functioning/Environment Prior Level of Function : Independent/Modified Independent             Mobility Comments: A few months ago before the pain in the foot began he was independent without DME; recently began to use RW to manage the pain with help of wife ADLs Comments: assist for bathing/dressing and toileting from pt's spouse, dependent in IADLs        OT Problem List: Impaired balance (sitting and/or standing);Decreased activity tolerance;Pain;Decreased knowledge of use of DME or AE;Decreased strength      OT Treatment/Interventions: Self-care/ADL training;DME and/or AE instruction;Therapeutic activities;Patient/family education;Balance training    OT Goals(Current goals can be found in the care plan section) Acute Rehab OT Goals OT Goal Formulation: With patient Time For Goal Achievement: 07/31/23 Potential to Achieve Goals: Good ADL Goals Pt Will Perform Upper Body Bathing: with set-up;sitting Pt Will  Perform Lower Body Bathing: with min assist;sitting/lateral leans Pt Will Perform Upper Body Dressing: with set-up;sitting Pt Will Perform Lower Body Dressing: with min assist;sitting/lateral leans Pt Will Transfer to Toilet: with supervision;ambulating;bedside commode Pt Will Perform Toileting - Clothing Manipulation and hygiene: with supervision;sitting/lateral leans Pt Will Perform Tub/Shower Transfer: Tub transfer;with supervision;tub bench;rolling walker  OT Frequency: Min 1X/week    Co-evaluation              AM-PAC OT "6 Clicks" Daily Activity     Outcome Measure Help from another person eating meals?: None Help from another person taking care of personal grooming?: A Little Help from another person toileting, which includes using toliet, bedpan, or urinal?: A Lot Help from another person bathing (including washing, rinsing, drying)?: A Lot Help from another person to put on and taking off regular upper body clothing?: A Little Help from another person to put on and taking off regular lower body clothing?: A Lot 6 Click Score: 16   End of Session Equipment Utilized During Treatment: Gait belt;Rolling walker (2 wheels)  Activity Tolerance: Patient tolerated treatment well Patient left: in chair;with call bell/phone within reach;with chair alarm set;with family/visitor present  OT Visit Diagnosis: Unsteadiness on feet (R26.81);Pain  Pain - Right/Left: Left Pain - part of body: Leg                Time: 6962-9528 OT Time Calculation (min): 20 min Charges:  OT General Charges $OT Visit: 1 Visit OT Evaluation $OT Eval Moderate Complexity: 1 Mod  Berna Spare, OTR/L Acute Rehabilitation Services Office: [phone]   Evern Bio 07/17/2023, 2:12 PM

## 2023-07-17 NOTE — Progress Notes (Signed)
Patient ID: Patrick [name], male   DOB: 05-21-1946, 77 y.o.   MRN: [id number] Patient is a 77 year old gentleman who is postoperative day 1 left below-knee amputation.  There is no drainage in the wound VAC canister there is a good suction fit.  Patient is resting comfortably.  Anticipate discharge to inpatient versus outpatient rehab.

## 2023-07-17 NOTE — Plan of Care (Signed)
Problem: Education: Goal: Knowledge of the prescribed therapeutic regimen will improve Outcome: Progressing Goal: Ability to verbalize activity precautions or restrictions will improve Outcome: Progressing Goal: Understanding of discharge needs will improve Outcome: Progressing

## 2023-07-17 NOTE — Evaluation (Signed)
Physical Therapy Evaluation Patient Details Name: Patrick [name] MRN: [id number] DOB: 07-Sep-1946 Today's Date: 07/17/2023  History of Present Illness  Pt is a 77 y.o. 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 PMH significant for DM, HTN, PVD.   Clinical Impression  Pt presents with condition above and deficits mentioned below, see PT Problem List. Prior to onset of his L leg pain, he was independent without DME for functional mobility. He resides with his wife and daughter in a 1-level house with 5 STE. Currently, pt demonstrates deficits in balance, activity tolerance, and gross strength and he is at risk for falls. Pt required minA to transfer to stand and min-modA to ambulate a short distance in the room with a RW this date. Pt has had a drastic functional change and could greatly benefit from intensive inpatient rehab, > 3 hours/day, in order to teach him how to manage his residual limb and how to mobilize differently now. However, it would not be unreasonable for pt to d/c home with OPPT follow-up if the pt and family would rather pursue this option due to finances, provided the family can provide the level of assistance he needs. Will continue to follow acutely.      If plan is discharge home, recommend the following: A little help with walking and/or transfers;A little help with bathing/dressing/bathroom;Assistance with cooking/housework;Assist for transportation;Help with stairs or ramp for entrance   Can travel by private vehicle        Equipment Recommendations BSC/3in1;Wheelchair (measurements PT);Wheelchair cushion (measurements PT)  Recommendations for Other Services  Rehab consult    Functional Status Assessment Patient has had a recent decline in their functional status and demonstrates the ability to make significant improvements in function in a reasonable and predictable amount of  time.     Precautions / Restrictions Precautions Precautions: Fall Precaution Comments: wound vac L residual limb Restrictions Weight Bearing Restrictions: Yes LLE Weight Bearing: Non weight bearing      Mobility  Bed Mobility Overal bed mobility: Needs Assistance Bed Mobility: Supine to Sit     Supine to sit: Supervision, HOB elevated     General bed mobility comments: Supervision for safety coming to sit R EOB    Transfers Overall transfer level: Needs assistance Equipment used: Rolling walker (2 wheels) Transfers: Sit to/from Stand Sit to Stand: Min assist           General transfer comment: MinA to power up to stand from EOB to RW, cuing pt for proper hand placement.    Ambulation/Gait Ambulation/Gait assistance: Min assist, Mod assist Gait Distance (Feet): 16 Feet Assistive device: Rolling walker (2 wheels) Gait Pattern/deviations:  (hop-to) Gait velocity: reduced Gait velocity interpretation: <1.31 ft/sec, indicative of household ambulator   General Gait Details: Pt hops anteriorly ~8 ft then posteriorly ~8 ft with RW. MinA majority of time for balance ambulating forward but modA to prevent LOB when hopping posteriorly as pt was noted to be unsteady and lose balance on occasion and need assistance to recover.  Stairs            Wheelchair Mobility     Tilt Bed    Modified Rankin (Stroke Patients Only)       Balance Overall balance assessment: Needs assistance Sitting-balance support: No upper extremity supported, Feet supported Sitting balance-Leahy Scale: Fair     Standing balance support: Bilateral upper extremity supported, During functional activity, Reliant  on assistive device for balance Standing balance-Leahy Scale: Poor Standing balance comment: Reliant on RW and external physical assistance                             Pertinent Vitals/Pain Pain Assessment Pain Assessment: No/denies pain    Home Living  Family/patient expects to be discharged to:: Private residence Living Arrangements: Spouse/significant other;Children (daughter) Available Help at Discharge: Family;Available 24 hours/day Type of Home: House Home Access: Stairs to enter Entrance Stairs-Rails: None Entrance Stairs-Number of Steps: 5   Home Layout: One level Home Equipment: Agricultural consultant (2 wheels);Crutches      Prior Function Prior Level of Function : Independent/Modified Independent             Mobility Comments: A few months ago before the pain in the foot began he was independent without DME; recently began to use RW to manage the pain       Extremity/Trunk Assessment   Upper Extremity Assessment Upper Extremity Assessment: Defer to OT evaluation    Lower Extremity Assessment Lower Extremity Assessment: LLE deficits/detail LLE Deficits / Details: s/p BKA 07/16/23 with wound vac attached; [facility] AROM    Cervical / Trunk Assessment Cervical / Trunk Assessment: Normal  Communication   Communication Communication: Other (comment) (speaks Spanish - used video interpreter throughout session)  Cognition Arousal: Alert Behavior During Therapy: WFL for tasks assessed/performed Overall Cognitive Status: Difficult to assess                                 General Comments: Follows cues, but language barrier impacting full cognitive assessment        General Comments General comments (skin integrity, edema, etc.): encouraged AROM of L leg and elevating leg at rest; discussed PT options, AIR vs OP and pt and daughter prefer to pursue AIR at this time    Exercises     Assessment/Plan    PT Assessment Patient needs continued PT services  PT Problem List Decreased strength;Decreased range of motion;Decreased activity tolerance;Decreased balance;Decreased skin integrity       PT Treatment Interventions DME instruction;Gait training;Stair training;Functional mobility training;Therapeutic  activities;Therapeutic exercise;Balance training;Neuromuscular re-education;Patient/family education;Wheelchair mobility training    PT Goals (Current goals can be found in the Care Plan section)  Acute Rehab PT Goals Patient Stated Goal: to go to AIR to improve independence with mobility PT Goal Formulation: With patient/family Time For Goal Achievement: 07/31/23 Potential to Achieve Goals: Good    Frequency Min 1X/week     Co-evaluation               AM-PAC PT "6 Clicks" Mobility  Outcome Measure Help needed turning from your back to your side while in a flat bed without using bedrails?: A Little Help needed moving from lying on your back to sitting on the side of a flat bed without using bedrails?: A Little Help needed moving to and from a bed to a chair (including a wheelchair)?: A Little Help needed standing up from a chair using your arms (e.g., wheelchair or bedside chair)?: A Little Help needed to walk in hospital room?: Total Help needed climbing 3-5 steps with a railing? : Total 6 Click Score: 14    End of Session Equipment Utilized During Treatment: Gait belt Activity Tolerance: Patient tolerated treatment well Patient left: in chair;with call bell/phone within reach;with chair alarm set;with family/visitor present  Nurse Communication: Mobility status PT Visit Diagnosis: Unsteadiness on feet (R26.81);Other abnormalities of gait and mobility (R26.89);Muscle weakness (generalized) (M62.81);Difficulty in walking, not elsewhere classified (R26.2)    Time: 4098-1191 PT Time Calculation (min) (ACUTE ONLY): 32 min   Charges:   PT Evaluation $PT Eval Moderate Complexity: 1 Mod PT Treatments $Therapeutic Activity: 8-22 mins PT General Charges $$ ACUTE PT VISIT: 1 Visit         Virgil Benedict, PT, DPT Acute Rehabilitation Services  Office: [phone]   Bettina Gavia 07/17/2023, 1:29 PM

## 2023-07-17 NOTE — Anesthesia Postprocedure Evaluation (Signed)
Anesthesia Post Note  Patient: Aldan Dudding  Procedure(s) Performed: LEFT BELOW KNEE AMPUTATION (Left: Knee) APPLICATION OF WOUND VAC (Left: Knee)     Patient location during evaluation: PACU Anesthesia Type: Regional Level of consciousness: awake Pain management: pain level controlled Vital Signs Assessment: post-procedure vital signs reviewed and stable Respiratory status: spontaneous breathing, nonlabored ventilation and respiratory function stable Cardiovascular status: blood pressure returned to baseline and stable Postop Assessment: no apparent nausea or vomiting Anesthetic complications: no   There were no known notable events for this encounter.  Last Vitals:  Vitals:   07/16/23 1956 07/17/23 0339  BP: 128/68 (!) 163/93  Pulse: 90 85  Resp: 18 18  Temp: 37 C 37.2 C  SpO2: 95% 98%    Last Pain:  Vitals:   07/17/23 0339  TempSrc: Oral  PainSc:                  Catheryn Bacon Toussaint Golson

## 2023-07-18 LAB — GLUCOSE, CAPILLARY
Glucose-Capillary: 146 mg/dL — ABNORMAL HIGH (ref 70–99)
Glucose-Capillary: 145 mg/dL — ABNORMAL HIGH (ref 70–99)
Glucose-Capillary: 151 mg/dL — ABNORMAL HIGH (ref 70–99)
Glucose-Capillary: 131 mg/dL — ABNORMAL HIGH (ref 70–99)

## 2023-07-18 MED ORDER — ORAL CARE MOUTH RINSE: 15.0000 mL | OROMUCOSAL | Status: DC | PRN

## 2023-07-18 NOTE — Plan of Care (Signed)
Problem: Education: Goal: Knowledge of the prescribed therapeutic regimen will improve Outcome: Progressing   Problem: Clinical Measurements: Goal: Postoperative complications will be avoided or minimized Outcome: Progressing   Problem: Pain Management: Goal: Pain level will decrease with appropriate interventions Outcome: Progressing

## 2023-07-18 NOTE — Progress Notes (Signed)
Inpatient Rehab Coordinator Note:  I met with patient and his daughter, Byrd Hesselbach, at bedside to discuss CIR recommendations and goals/expectations of CIR stay.  We reviewed 3 hrs/day of therapy, physician follow up, and average length of stay 2 weeks (dependent upon progress) with goals of supervision w/c level.  We discussed cost of care and no bed availability until next week.  At this time, pt feels that he is not mobilizing at a level where he feels comfortable going home with family support and wishes to continue to pursue CIR.  I will f/u with them on Monday.    Estill Dooms, PT, DPT Admissions Coordinator [phone] 07/18/23  11:42 AM

## 2023-07-18 NOTE — Progress Notes (Signed)
Physical Therapy Treatment Patient Details Name: Patrick [name] MRN: [id number] DOB: 15-Feb-1946 Today's Date: 07/18/2023   History of Present Illness Pt is a 77 y.o. 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 PMH significant for DM, HTN, PVD.    PT Comments  Pt tolerates treatment well. Pt demonstrates improvement in transfer quality with verbal cues for hand placement. PT encourages pt to reduce distance between desired destination prior to initiating stand to sit during transfers in an effort to reduce the risk for falls. Pt's ambulation tolerance remains limited at this time, only hopping for short distances prior to fatiguing. Pt also requires assistance to mobilize in manual wheelchair, with assist for brake management and steering as he tends to drift to R side. Pt is impulsive and remains at a high risk for falls. Pt and family still express the desire to pursue inpatient rehab at this time. PT will continue to follow in an effort to improve activity tolerance and to initiate stair training.    If plan is discharge home, recommend the following: A little help with walking and/or transfers;A little help with bathing/dressing/bathroom;Assistance with cooking/housework;Assist for transportation;Help with stairs or ramp for entrance   Can travel by private vehicle        Equipment Recommendations  BSC/3in1;Wheelchair (measurements PT);Wheelchair cushion (measurements PT)    Recommendations for Other Services       Precautions / Restrictions Precautions Precautions: Fall Precaution Comments: wound vac L residual limb Restrictions Weight Bearing Restrictions: Yes LLE Weight Bearing: Non weight bearing     Mobility  Bed Mobility Overal bed mobility: Needs Assistance Bed Mobility: Supine to Sit, Sit to Supine     Supine to sit: Supervision, HOB elevated Sit to supine: Contact guard  assist        Transfers Overall transfer level: Needs assistance Equipment used: Rolling walker (2 wheels) Transfers: Sit to/from Stand, Bed to chair/wheelchair/BSC Sit to Stand: Min assist, Contact guard assist (minA progressing to CGA with verbal cues. pt stood 7 times during session)                Ambulation/Gait Ambulation/Gait assistance: Min assist Gait Distance (Feet): 7 Feet (7' x 2 trials) Assistive device: Rolling walker (2 wheels) Gait Pattern/deviations:  (hop-to gait) Gait velocity: reduced Gait velocity interpretation: <1.31 ft/sec, indicative of household ambulator   General Gait Details: pt with short hop-to gait, minmal foot clearance. PT cues to ensure increased proximity to chair/bed prior to initiating stand to sit   Stairs             Wheelchair Mobility     Tilt Bed    Modified Rankin (Stroke Patients Only)       Balance Overall balance assessment: Needs assistance Sitting-balance support: No upper extremity supported, Feet supported Sitting balance-Leahy Scale: Good     Standing balance support: Bilateral upper extremity supported, Reliant on assistive device for balance Standing balance-Leahy Scale: Poor                              Cognition Arousal: Alert Behavior During Therapy: Impulsive Overall Cognitive Status: Impaired/Different from baseline Area of Impairment: Memory, Safety/judgement                     Memory: Decreased short-term memory   Safety/Judgement: Decreased awareness of safety  Exercises      General Comments General comments (skin integrity, edema, etc.): VSS on RA      Pertinent Vitals/Pain Pain Assessment Pain Assessment: Faces Faces Pain Scale: Hurts even more Pain Location: L residual limb Pain Descriptors / Indicators: Aching Pain Intervention(s): Monitored during session    Home Living                          Prior Function             PT Goals (current goals can now be found in the care plan section) Acute Rehab PT Goals Patient Stated Goal: to go to AIR to improve independence with mobility Progress towards PT goals: Progressing toward goals    Frequency    Min 1X/week      PT Plan      Co-evaluation              AM-PAC PT "6 Clicks" Mobility   Outcome Measure  Help needed turning from your back to your side while in a flat bed without using bedrails?: A Little Help needed moving from lying on your back to sitting on the side of a flat bed without using bedrails?: A Little Help needed moving to and from a bed to a chair (including a wheelchair)?: A Little Help needed standing up from a chair using your arms (e.g., wheelchair or bedside chair)?: A Little Help needed to walk in hospital room?: Total Help needed climbing 3-5 steps with a railing? : Total 6 Click Score: 14    End of Session Equipment Utilized During Treatment: Gait belt Activity Tolerance: Patient tolerated treatment well Patient left: in bed;with call bell/phone within reach;with bed alarm set Nurse Communication: Mobility status PT Visit Diagnosis: Unsteadiness on feet (R26.81);Other abnormalities of gait and mobility (R26.89);Muscle weakness (generalized) (M62.81);Difficulty in walking, not elsewhere classified (R26.2) Pain - Right/Left: Left Pain - part of body: Ankle and joints of foot     Time: 1458-1536 PT Time Calculation (min) (ACUTE ONLY): 38 min  Charges:    $Gait Training: 8-22 mins $Therapeutic Activity: 8-22 mins $Wheel Chair Management: 8-22 mins PT General Charges $$ ACUTE PT VISIT: 1 Visit                     Arlyss Gandy, PT, DPT Acute Rehabilitation Office [phone]    Arlyss Gandy 07/18/2023, 4:00 PM

## 2023-07-18 NOTE — Plan of Care (Signed)
Problem: Activity: Goal: Ability to perform//tolerate increased activity and mobilize with assistive devices will improve Outcome: Progressing

## 2023-07-19 LAB — GLUCOSE, CAPILLARY
Glucose-Capillary: 135 mg/dL — ABNORMAL HIGH (ref 70–99)
Glucose-Capillary: 132 mg/dL — ABNORMAL HIGH (ref 70–99)
Glucose-Capillary: 107 mg/dL — ABNORMAL HIGH (ref 70–99)
Glucose-Capillary: 144 mg/dL — ABNORMAL HIGH (ref 70–99)

## 2023-07-19 NOTE — Progress Notes (Signed)
Physical Therapy Treatment Patient Details Name: Patrick [name] MRN: [id number] DOB: 09/24/1946 Today's Date: 07/19/2023   History of Present Illness Pt is a 77 y.o. 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 PMH significant for DM, HTN, PVD.    PT Comments  Pt supine in bed on arrival this session.  Planned to trial knee walker this session but upon sitting he reported feeling dizzy.  Pt performed sit to stand and BP showed decline indicative of orthostasis and RN was informed.  He was transferred to recliner via squat pivot once reclined his BP improved.  He also reports pain in his R heel and RN was informed of need for prevalon boot in supine to protect his heal.    BP in sitting: 105/73(84) BP in standing: 83/54(65) BP in recliner reclined: 140/82(100)    If plan is discharge home, recommend the following: A little help with walking and/or transfers;A little help with bathing/dressing/bathroom;Assistance with cooking/housework;Assist for transportation;Help with stairs or ramp for entrance   Can travel by private vehicle        Equipment Recommendations  BSC/3in1;Wheelchair (measurements PT);Wheelchair cushion (measurements PT)    Recommendations for Other Services Rehab consult     Precautions / Restrictions Precautions Precautions: Fall Precaution Comments: wound vac L residual limb Restrictions Weight Bearing Restrictions: Yes LLE Weight Bearing: Non weight bearing     Mobility  Bed Mobility Overal bed mobility: Needs Assistance Bed Mobility: Supine to Sit     Supine to sit: Min assist, HOB elevated     General bed mobility comments: Reports dizziness once in sitting performed assessement of BP in sitting and then in standing.    Transfers Overall transfer level: Needs assistance Equipment used: Rolling walker (2 wheels) Transfers: Sit to/from Stand Sit to Stand:  Contact guard assist           General transfer comment: cues for technique, assist to rise and steady.  Performed BP in standing with + orthostasis returned to sitting.  Opted for squat pivot OOB to recliner.    Ambulation/Gait Ambulation/Gait assistance:  (unable d/t + orthostasis.)                 Stairs             Wheelchair Mobility     Tilt Bed    Modified Rankin (Stroke Patients Only)       Balance Overall balance assessment: Needs assistance Sitting-balance support: No upper extremity supported, Feet supported Sitting balance-Leahy Scale: Good     Standing balance support: Bilateral upper extremity supported, Reliant on assistive device for balance Standing balance-Leahy Scale: Poor Standing balance comment: Reliant on RW and min assist                            Cognition Arousal: Alert Behavior During Therapy: WFL for tasks assessed/performed Overall Cognitive Status: Impaired/Different from baseline                                          Exercises      General Comments        Pertinent Vitals/Pain Pain Assessment Pain Assessment: 0-10 Pain Score: 5  (but appears more painful then 5/10) Pain Location: L residual limb Pain Descriptors / Indicators:  Aching, Grimacing, Discomfort    Home Living                          Prior Function            PT Goals (current goals can now be found in the care plan section) Acute Rehab PT Goals Patient Stated Goal: to go to AIR to improve independence with mobility PT Goal Formulation: With patient Potential to Achieve Goals: Good Additional Goals Additional Goal #1: Pt will be able to propel a w/c >/= 250 ft with supervision Progress towards PT goals: Progressing toward goals    Frequency    Min 1X/week      PT Plan      Co-evaluation              AM-PAC PT "6 Clicks" Mobility   Outcome Measure  Help needed turning from your  back to your side while in a flat bed without using bedrails?: A Little Help needed moving from lying on your back to sitting on the side of a flat bed without using bedrails?: A Little Help needed moving to and from a bed to a chair (including a wheelchair)?: A Little Help needed standing up from a chair using your arms (e.g., wheelchair or bedside chair)?: A Little Help needed to walk in hospital room?: Total Help needed climbing 3-5 steps with a railing? : Total 6 Click Score: 14    End of Session Equipment Utilized During Treatment: Gait belt Activity Tolerance: Patient tolerated treatment well Patient left: with call bell/phone within reach;in chair;with chair alarm set Nurse Communication: Mobility status (encouraged OOB to recliner as he wanted to return to bed right after.  Informed nurse he was educated to sit x 2 hours to promote OOB mobility.) PT Visit Diagnosis: Unsteadiness on feet (R26.81);Other abnormalities of gait and mobility (R26.89);Muscle weakness (generalized) (M62.81);Difficulty in walking, not elsewhere classified (R26.2) Pain - Right/Left: Left Pain - part of body: Ankle and joints of foot     Time: 1211-1248 PT Time Calculation (min) (ACUTE ONLY): 37 min  Charges:    $Therapeutic Activity: 23-37 mins PT General Charges $$ ACUTE PT VISIT: 1 Visit                     Bonney Leitz , PTA Acute Rehabilitation Services Office [phone]    Shalev Helminiak Artis Delay 07/19/2023, 1:09 PM

## 2023-07-19 NOTE — Progress Notes (Signed)
Patient ID: Patrick [name], male   DOB: 1946-05-04, 77 y.o.   MRN: [id number] Patient is comfortable this morning.  No drainage in the wound VAC canister there is a good suction fit.  Patient's family at bedside.  They are interested in inpatient rehab.  Patient to be evaluated on Monday.

## 2023-07-19 NOTE — Plan of Care (Signed)
Problem: Activity: Goal: Ability to perform//tolerate increased activity and mobilize with assistive devices will improve Outcome: Progressing

## 2023-07-20 LAB — GLUCOSE, CAPILLARY
Glucose-Capillary: 130 mg/dL — ABNORMAL HIGH (ref 70–99)
Glucose-Capillary: 150 mg/dL — ABNORMAL HIGH (ref 70–99)
Glucose-Capillary: 104 mg/dL — ABNORMAL HIGH (ref 70–99)
Glucose-Capillary: 137 mg/dL — ABNORMAL HIGH (ref 70–99)

## 2023-07-20 NOTE — Plan of Care (Signed)
  Problem: Education: Goal: Knowledge of the prescribed therapeutic regimen will improve Outcome: Progressing Goal: Ability to verbalize activity precautions or restrictions will improve Outcome: Progressing Goal: Understanding of discharge needs will improve Outcome: Progressing   Problem: Clinical Measurements: Goal: Postoperative complications will be avoided or minimized Outcome: Progressing   Problem: Self-Concept: Goal: Ability to maintain and perform role responsibilities to the fullest extent possible will improve Outcome: Progressing   Problem: Pain Management: Goal: Pain level will decrease with appropriate interventions Outcome: Progressing   Problem: Skin Integrity: Goal: Demonstration of wound healing without infection will improve Outcome: Progressing   Problem: Education: Goal: Understanding of CV disease, CV risk reduction, and recovery process will improve Outcome: Progressing Goal: Individualized Educational Video(s) Outcome: Progressing   Problem: Activity: Goal: Ability to return to baseline activity level will improve Outcome: Progressing   Problem: Cardiovascular: Goal: Ability to achieve and maintain adequate cardiovascular perfusion will improve Outcome: Progressing Goal: Vascular access site(s) Level 0-1 will be maintained Outcome: Progressing   Problem: Health Behavior/Discharge Planning: Goal: Ability to safely manage health-related needs after discharge will improve Outcome: Progressing   Problem: Education: Goal: Ability to describe self-care measures that may prevent or decrease complications (Diabetes Survival Skills Education) will improve Outcome: Progressing Goal: Individualized Educational Video(s) Outcome: Progressing   Problem: Coping: Goal: Ability to adjust to condition or change in health will improve Outcome: Progressing   Problem: Fluid Volume: Goal: Ability to maintain a balanced intake and output will improve Outcome:  Progressing   Problem: Health Behavior/Discharge Planning: Goal: Ability to identify and utilize available resources and services will improve Outcome: Progressing Goal: Ability to manage health-related needs will improve Outcome: Progressing   Problem: Metabolic: Goal: Ability to maintain appropriate glucose levels will improve Outcome: Progressing   Problem: Nutritional: Goal: Maintenance of adequate nutrition will improve Outcome: Progressing Goal: Progress toward achieving an optimal weight will improve Outcome: Progressing   Problem: Skin Integrity: Goal: Risk for impaired skin integrity will decrease Outcome: Progressing   Problem: Tissue Perfusion: Goal: Adequacy of tissue perfusion will improve Outcome: Progressing   Problem: Education: Goal: Knowledge of General Education information will improve Description: Including pain rating scale, medication(s)/side effects and non-pharmacologic comfort measures Outcome: Progressing   Problem: Health Behavior/Discharge Planning: Goal: Ability to manage health-related needs will improve Outcome: Progressing   Problem: Clinical Measurements: Goal: Ability to maintain clinical measurements within normal limits will improve Outcome: Progressing Goal: Will remain free from infection Outcome: Progressing Goal: Diagnostic test results will improve Outcome: Progressing Goal: Respiratory complications will improve Outcome: Progressing Goal: Cardiovascular complication will be avoided Outcome: Progressing   Problem: Activity: Goal: Risk for activity intolerance will decrease Outcome: Progressing   Problem: Nutrition: Goal: Adequate nutrition will be maintained Outcome: Progressing   Problem: Coping: Goal: Level of anxiety will decrease Outcome: Progressing   Problem: Pain Managment: Goal: General experience of comfort will improve Outcome: Progressing   Problem: Elimination: Goal: Will not experience complications  related to bowel motility Outcome: Progressing Goal: Will not experience complications related to urinary retention Outcome: Progressing

## 2023-07-20 NOTE — Plan of Care (Signed)
  Problem: Education: Goal: Knowledge of the prescribed therapeutic regimen will improve Outcome: Progressing Goal: Ability to verbalize activity precautions or restrictions will improve Outcome: Progressing Goal: Understanding of discharge needs will improve Outcome: Progressing   Problem: Activity: Goal: Ability to perform//tolerate increased activity and mobilize with assistive devices will improve Outcome: Progressing   Problem: Clinical Measurements: Goal: Postoperative complications will be avoided or minimized Outcome: Progressing   Problem: Self-Care: Goal: Ability to meet self-care needs will improve Outcome: Progressing   Problem: Self-Concept: Goal: Ability to maintain and perform role responsibilities to the fullest extent possible will improve Outcome: Progressing   Problem: Pain Management: Goal: Pain level will decrease with appropriate interventions Outcome: Progressing   Problem: Skin Integrity: Goal: Demonstration of wound healing without infection will improve Outcome: Progressing   Problem: Education: Goal: Understanding of CV disease, CV risk reduction, and recovery process will improve Outcome: Progressing Goal: Individualized Educational Video(s) Outcome: Progressing   Problem: Activity: Goal: Ability to return to baseline activity level will improve Outcome: Progressing   Problem: Cardiovascular: Goal: Ability to achieve and maintain adequate cardiovascular perfusion will improve Outcome: Progressing Goal: Vascular access site(s) Level 0-1 will be maintained Outcome: Progressing   Problem: Health Behavior/Discharge Planning: Goal: Ability to safely manage health-related needs after discharge will improve Outcome: Progressing   Problem: Education: Goal: Ability to describe self-care measures that may prevent or decrease complications (Diabetes Survival Skills Education) will improve Outcome: Progressing Goal: Individualized Educational  Video(s) Outcome: Progressing   Problem: Coping: Goal: Ability to adjust to condition or change in health will improve Outcome: Progressing   Problem: Fluid Volume: Goal: Ability to maintain a balanced intake and output will improve Outcome: Progressing   Problem: Health Behavior/Discharge Planning: Goal: Ability to identify and utilize available resources and services will improve Outcome: Progressing Goal: Ability to manage health-related needs will improve Outcome: Progressing   Problem: Metabolic: Goal: Ability to maintain appropriate glucose levels will improve Outcome: Progressing   Problem: Nutritional: Goal: Maintenance of adequate nutrition will improve Outcome: Progressing Goal: Progress toward achieving an optimal weight will improve Outcome: Progressing   Problem: Skin Integrity: Goal: Risk for impaired skin integrity will decrease Outcome: Progressing   Problem: Tissue Perfusion: Goal: Adequacy of tissue perfusion will improve Outcome: Progressing   Problem: Education: Goal: Knowledge of General Education information will improve Description: Including pain rating scale, medication(s)/side effects and non-pharmacologic comfort measures Outcome: Progressing   Problem: Health Behavior/Discharge Planning: Goal: Ability to manage health-related needs will improve Outcome: Progressing   Problem: Clinical Measurements: Goal: Ability to maintain clinical measurements within normal limits will improve Outcome: Progressing Goal: Will remain free from infection Outcome: Progressing Goal: Diagnostic test results will improve Outcome: Progressing Goal: Respiratory complications will improve Outcome: Progressing Goal: Cardiovascular complication will be avoided Outcome: Progressing   Problem: Activity: Goal: Risk for activity intolerance will decrease Outcome: Progressing   Problem: Nutrition: Goal: Adequate nutrition will be maintained Outcome:  Progressing   Problem: Coping: Goal: Level of anxiety will decrease Outcome: Progressing   Problem: Elimination: Goal: Will not experience complications related to bowel motility Outcome: Progressing Goal: Will not experience complications related to urinary retention Outcome: Progressing   Problem: Pain Managment: Goal: General experience of comfort will improve Outcome: Progressing   Problem: Safety: Goal: Ability to remain free from injury will improve Outcome: Progressing   Problem: Skin Integrity: Goal: Risk for impaired skin integrity will decrease Outcome: Progressing

## 2023-07-20 NOTE — Progress Notes (Signed)
Patient ID: Patrick [name], male   DOB: 07-02-1946, 77 y.o.   MRN: [id number] Patient was some pain overnight.  Vitals otherwise stable.  Back remains with nothing in the canister with good suction seal.  Pending evaluation by inpatient rehab.  Work with physical therapy.

## 2023-07-21 LAB — GLUCOSE, CAPILLARY
Glucose-Capillary: 194 mg/dL — ABNORMAL HIGH (ref 70–99)
Glucose-Capillary: 100 mg/dL — ABNORMAL HIGH (ref 70–99)
Glucose-Capillary: 121 mg/dL — ABNORMAL HIGH (ref 70–99)
Glucose-Capillary: 98 mg/dL (ref 70–99)

## 2023-07-21 NOTE — Progress Notes (Signed)
Inpatient Rehab Admissions Coordinator:   Pt continues to hope for CIR.  I will plan for admit when bed available.  Updated dtr Byrd Hesselbach via phone.    Estill Dooms, PT, DPT Admissions Coordinator [phone] 07/21/23  3:06 PM

## 2023-07-21 NOTE — Progress Notes (Signed)
Physical Therapy Treatment Patient Details Name: Patrick [name] MRN: [id number] DOB: 06-26-1946 Today's Date: 07/21/2023   History of Present Illness Pt is a 77 y.o. 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 PMH significant for DM, HTN, PVD.    PT Comments  Continuing work on functional mobility and activity tolerance;  Raquel, Estate manager/land agent, present and facilitated clear communication; Session focused on transfers and gait, with particular attention to activity tolerance; Vitals WNL taken in sitting and standing throughout session; Pt participating well and motivated to be independent; also stated he wants to prep for a prosthesis;   Provided eduction re: stretching hip and knee flexors and strengthening hip and knee extensors in prep for a prosthesis; Good form with bolstered bridging;   Pt and daughter would like to pursue an AIR stay to maximize independence and safety with mobility and ADLs in prep for going home; Daughter had some questions about possibility of family staying after hours, or if Spanish Interpreteation will be available   If plan is discharge home, recommend the following: A little help with walking and/or transfers;A little help with bathing/dressing/bathroom;Assistance with cooking/housework;Assist for transportation;Help with stairs or ramp for entrance   Can travel by private vehicle        Equipment Recommendations  BSC/3in1;Wheelchair (measurements PT);Wheelchair cushion (measurements PT)    Recommendations for Other Services Rehab consult     Precautions / Restrictions Precautions Precautions: Fall Precaution Comments: wound vac L residual limb Restrictions Weight Bearing Restrictions: Yes LLE Weight Bearing: Non weight bearing     Mobility  Bed Mobility Overal bed mobility: Needs Assistance Bed Mobility: Sit to Supine       Sit to supine:  Contact guard assist   General bed mobility comments: line management assist    Transfers Overall transfer level: Needs assistance Equipment used: Rolling walker (2 wheels) Transfers: Sit to/from Stand Sit to Stand: Contact guard assist, Min assist, From elevated surface           General transfer comment: cues for safe hand placement and assist for balance    Ambulation/Gait Ambulation/Gait assistance: Min assist, Mod assist Gait Distance (Feet): 25 Feet (including up and down small incline in and out of bathroom) Assistive device: Rolling walker (2 wheels) Gait Pattern/deviations:  (Hop-to gait)       General Gait Details: pt with short hop-to gait, minmal foot clearance. PT cues to ensure increased proximity to chair/bed prior to initiating stand to sit   Stairs             Wheelchair Mobility     Tilt Bed    Modified Rankin (Stroke Patients Only)       Balance Overall balance assessment: Needs assistance Sitting-balance support: No upper extremity supported, Feet supported Sitting balance-Leahy Scale: Good     Standing balance support: Bilateral upper extremity supported, Reliant on assistive device for balance Standing balance-Leahy Scale: Poor Standing balance comment: Reliant on RW and min assist                            Cognition Arousal: Alert Behavior During Therapy: WFL for tasks assessed/performed Overall Cognitive Status: Within Functional Limits for tasks assessed  General Comments: cues for safe hand placement - but overall wfl        Exercises Amputee Exercises Gluteal Sets: AROM, Both, 5 reps Other Exercises Other Exercises: Bolstered bridging x10    General Comments General comments (skin integrity, edema, etc.): VSS on RA, see vital flow sheets for orthostatic measurements [facility])      Pertinent Vitals/Pain Pain Assessment Pain Assessment: 0-10 Pain Score: 5   Pain Location: L residual limb at end of session Pain Descriptors / Indicators: Aching, Grimacing, Discomfort Pain Intervention(s): Patient requesting pain meds-RN notified    Home Living                          Prior Function            PT Goals (current goals can now be found in the care plan section) Acute Rehab PT Goals Patient Stated Goal: to go to AIR to improve independence with mobility PT Goal Formulation: With patient Time For Goal Achievement: 07/31/23 Potential to Achieve Goals: Good Progress towards PT goals: Progressing toward goals    Frequency    Min 1X/week      PT Plan      Co-evaluation              AM-PAC PT "6 Clicks" Mobility   Outcome Measure  Help needed turning from your back to your side while in a flat bed without using bedrails?: A Little Help needed moving from lying on your back to sitting on the side of a flat bed without using bedrails?: A Little Help needed moving to and from a bed to a chair (including a wheelchair)?: A Little Help needed standing up from a chair using your arms (e.g., wheelchair or bedside chair)?: A Little Help needed to walk in hospital room?: A Lot Help needed climbing 3-5 steps with a railing? : Total 6 Click Score: 15    End of Session Equipment Utilized During Treatment: Gait belt Activity Tolerance: Patient tolerated treatment well Patient left: in bed;with call bell/phone within reach;with family/visitor present Nurse Communication: Mobility status PT Visit Diagnosis: Unsteadiness on feet (R26.81);Other abnormalities of gait and mobility (R26.89);Muscle weakness (generalized) (M62.81);Difficulty in walking, not elsewhere classified (R26.2) Pain - Right/Left: Left Pain - part of body: Leg     Time: 1315-1345 PT Time Calculation (min) (ACUTE ONLY): 30 min  Charges:    $Gait Training: 8-22 mins $Therapeutic Activity: 8-22 mins PT General Charges $$ ACUTE PT VISIT: 1 Visit                      Van Clines, PT  Acute Rehabilitation Services Office [phone] Secure Chat welcomed    Levi Aland 07/21/2023, 2:02 PM

## 2023-07-21 NOTE — Plan of Care (Signed)

## 2023-07-21 NOTE — Progress Notes (Signed)
Patient ID: Patrick [name], male   DOB: 10-04-1946, 77 y.o.   MRN: [id number] Patient is comfortable this morning.  No drainage in the wound VAC canister.  Awaiting authorization for inpatient rehab.

## 2023-07-21 NOTE — Progress Notes (Signed)
Occupational Therapy Treatment Patient Details Name: Patrick [name] MRN: [id number] DOB: November 16, 1945 Today's Date: 07/21/2023   History of present illness Pt is a 77 y.o. 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 PMH significant for DM, HTN, PVD.   OT comments  In person translator present this session as well as his daughter Patrick [name]. Pt is progressing towards OT goals this session. See vital flow sheet or info below for negative orthostatics. Pt denied dizziness or lightheadedness. Pt demonstrating transfers at min A, LB dressing at min guard from seated position. Set up for seated grooming tasks. Pt continues to demonstrate motivation and tolerance for intensive rehab, excellent family support - but needs to be supervision/mod I level. OT will continue to follow acutely and POC remains appropriate at this time.   Orthostatic Vitals      07/21/23 1300  Orthostatic Sitting  BP- Sitting 103/71  Pulse- Sitting 91  Orthostatic Standing at 0 minutes  BP- Standing at 0 minutes 99/63  Pulse- Standing at 0 minutes 99  Orthostatic Standing at 3 minutes  BP- Standing at 3 minutes 100/64  Pulse- Standing at 3 minutes 102         If plan is discharge home, recommend the following:  A little help with walking and/or transfers;A lot of help with bathing/dressing/bathroom;Assistance with cooking/housework;Assist for transportation;Help with stairs or ramp for entrance   Equipment Recommendations  BSC/3in1;Wheelchair (measurements OT);Wheelchair cushion (measurements OT)    Recommendations for Other Services      Precautions / Restrictions Precautions Precautions: Fall Precaution Comments: wound vac L residual limb Restrictions Weight Bearing Restrictions: Yes LLE Weight Bearing: Non weight bearing       Mobility Bed Mobility Overal bed mobility: Needs Assistance Bed Mobility: Sit to Supine        Sit to supine: Contact guard assist   General bed mobility comments: line management assist    Transfers Overall transfer level: Needs assistance Equipment used: Rolling walker (2 wheels) Transfers: Sit to/from Stand Sit to Stand: Contact guard assist, Min assist, From elevated surface           General transfer comment: cues for safe hand placement and assist for balance     Balance Overall balance assessment: Needs assistance Sitting-balance support: No upper extremity supported, Feet supported Sitting balance-Leahy Scale: Good     Standing balance support: Bilateral upper extremity supported, Reliant on assistive device for balance Standing balance-Leahy Scale: Poor Standing balance comment: Reliant on RW and min assist                           ADL either performed or assessed with clinical judgement   ADL Overall ADL's : Needs assistance/impaired                     Lower Body Dressing: Minimal assistance;Sitting/lateral leans Lower Body Dressing Details (indicate cue type and reason): donning sock Toilet Transfer: Minimal assistance;Cueing for safety;Ambulation;Rolling walker (2 wheels) Toilet Transfer Details (indicate cue type and reason): into bathroom and sitting on toilet Toileting- Clothing Manipulation and Hygiene: Minimal assistance;Sitting/lateral lean       Functional mobility during ADLs: Minimal assistance;Rolling walker (2 wheels)      Extremity/Trunk Assessment Upper Extremity Assessment Upper Extremity Assessment: Overall WFL for tasks assessed            Vision  Perception     Praxis      Cognition Arousal: Alert Behavior During Therapy: WFL for tasks assessed/performed Overall Cognitive Status: Within Functional Limits for tasks assessed                                 General Comments: cues for safe hand placement - but overall wfl        Exercises      Shoulder Instructions        General Comments VSS on RA, see vital flow sheets for orthostatic measurements [facility])    Pertinent Vitals/ Pain       Pain Assessment Pain Assessment: No/denies pain Pain Intervention(s): Monitored during session, Repositioned  Home Living                                          Prior Functioning/Environment              Frequency  Min 1X/week        Progress Toward Goals  OT Goals(current goals can now be found in the care plan section)  Progress towards OT goals: Progressing toward goals  Acute Rehab OT Goals OT Goal Formulation: With patient/family (daughter Patrick [name]) Time For Goal Achievement: 07/31/23 Potential to Achieve Goals: Good  Plan      Co-evaluation                 AM-PAC OT "6 Clicks" Daily Activity     Outcome Measure   Help from another person eating meals?: None Help from another person taking care of personal grooming?: A Little Help from another person toileting, which includes using toliet, bedpan, or urinal?: A Lot Help from another person bathing (including washing, rinsing, drying)?: A Lot Help from another person to put on and taking off regular upper body clothing?: A Little Help from another person to put on and taking off regular lower body clothing?: A Lot 6 Click Score: 16    End of Session Equipment Utilized During Treatment: Gait belt;Rolling walker (2 wheels)  OT Visit Diagnosis: Unsteadiness on feet (R26.81);Pain Pain - Right/Left: Left Pain - part of body: Leg   Activity Tolerance Patient tolerated treatment well   Patient Left in bed;with call bell/phone within reach;with family/visitor present (inetrepreter and PT still working with pt)   Nurse Communication Mobility status        Time: 7253-6644 OT Time Calculation (min): 23 min  Charges: OT General Charges $OT Visit: 1 Visit OT Treatments $Self Care/Home Management : 8-22 mins  Patrick [name] OTR/L Acute Rehabilitation  Services Office: [phone]  Patrick [name] [facility] 07/21/2023, 1:40 PM

## 2023-07-21 NOTE — PMR Pre-admission (Signed)
PMR Admission Coordinator Pre-Admission Assessment  Patient: Patrick [name] is an 77 y.o., male MRN: [id number] DOB: February 08, 1946 Height: 5\' 1"  (154.9 Patrick) Weight: 60 kg  Insurance Information HMO:     PPO:      PCP:      IPA:      80/20:      OTHER:  PRIMARY: uninsured      Policy#:       Subscriber:  Patrick [name]:       Phone#:      Fax#:  Pre-Cert#:       Employer:  Benefits:  Phone #:      [name]:  Eff. Date:      Deduct:       Out of Pocket Max:       Life Max:  CIR:       SNF:  Outpatient:      Co-Pay:  Home Health:       Co-Pay:  DME:      Co-Pay:  Providers:  SECONDARY:       Policy#:      Phone#:   Artist:       Phone#:   The Data processing manager" for patients in Inpatient Rehabilitation Facilities with attached "Privacy Act Statement-Health Care Records" was provided and verbally reviewed with: Patient and Family  Emergency Contact Information Contact Information     [name] Relation Home Work Patrick [name] Daughter   [phone]   Patrick [name] Daughter   [phone]      Other Contacts     [name] Relation Home Work Mobile   Patrick [name],Patrick [name] Brother [phone]     Patrick [name], Patrick [name]   [phone]   Patrick [name]   [phone]       Current Medical History  Patient Admitting Diagnosis: BKA L  History of Present Illness: Pt is a 77 y/o male with PMH of DM, HTN, PVD admitted to [facility] on 07/16/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  Previous admit for L toe amputation on 9/6.  Post op course pain management, wound vac to LLE residual limb. Therapy evaluations completed and pt was recommended for CIR>     Patient's medical record from [facility] has been reviewed by the rehabilitation admission coordinator and physician.  Past Medical History  Past Medical History:  Diagnosis Date   Diabetes mellitus without complication (HCC)    Hypertension    Peripheral vascular disease (HCC)     Has the patient had major surgery during 100 days prior  to admission? Yes  Family History   Family history is unknown by patient.  Current Medications  Current Facility-Administered Medications:    0.9 %  sodium chloride infusion, , Intravenous, Continuous, Nadara Mustard, MD, Last Rate: 75 mL/hr at 07/16/23 1343, New Bag at 07/16/23 1343   acetaminophen (TYLENOL) tablet 325-650 mg, 325-650 mg, Oral, Q6H PRN, Nadara Mustard, MD, 650 mg at 07/22/23 2027   alum & mag hydroxide-simeth (MAALOX/MYLANTA) 200-200-20 MG/5ML suspension 15-30 mL, 15-30 mL, Oral, Q2H PRN, Nadara Mustard, MD   ascorbic acid (VITAMIN C) tablet 1,000 mg, 1,000 mg, Oral, Daily, Nadara Mustard, MD, 1,000 mg at 07/23/23 0820   aspirin EC tablet 81 mg, 81 mg, Oral, Daily, Nadara Mustard, MD, 81 mg at 07/23/23 0820   atorvastatin (LIPITOR) tablet 80 mg, 80 mg, Oral, Daily, Nadara Mustard, MD, 80 mg at 07/23/23 0626   bisacodyl (DULCOLAX) EC tablet 5 mg, 5 mg, Oral, Daily PRN, Lajoyce Corners,  Randa Evens, MD, 5 mg at 07/20/23 0805   clopidogrel (PLAVIX) tablet 75 mg, 75 mg, Oral, Daily, Nadara Mustard, MD, 75 mg at 07/23/23 4098   docusate sodium (COLACE) capsule 100 mg, 100 mg, Oral, Daily, Nadara Mustard, MD, 100 mg at 07/23/23 1191   empagliflozin (JARDIANCE) tablet 25 mg, 25 mg, Oral, QAC breakfast, Nadara Mustard, MD, 25 mg at 07/23/23 0908   guaiFENesin-dextromethorphan (ROBITUSSIN DM) 100-10 MG/5ML syrup 15 mL, 15 mL, Oral, Q4H PRN, Nadara Mustard, MD   hydrALAZINE (APRESOLINE) injection 5 mg, 5 mg, Intravenous, Q20 Min PRN, Nadara Mustard, MD, 5 mg at 07/16/23 1728   hydrochlorothiazide (HYDRODIURIL) tablet 25 mg, 25 mg, Oral, Daily, Nadara Mustard, MD, 25 mg at 07/23/23 0820   HYDROmorphone (DILAUDID) injection 0.5-1 mg, 0.5-1 mg, Intravenous, Q4H PRN, Nadara Mustard, MD   insulin aspart (novoLOG) injection 0-15 Units, 0-15 Units, Subcutaneous, TID WC, Nadara Mustard, MD, 5 Units at 07/23/23 0906   insulin aspart (novoLOG) injection 4 Units, 4 Units, Subcutaneous, TID WC, Nadara Mustard,  MD, 4 Units at 07/23/23 4782   labetalol (NORMODYNE) injection 10 mg, 10 mg, Intravenous, Q10 min PRN, Nadara Mustard, MD   lisinopril (ZESTRIL) tablet 20 mg, 20 mg, Oral, Daily, Nadara Mustard, MD, 20 mg at 07/23/23 9562   magnesium citrate solution 1 Bottle, 1 Bottle, Oral, Once PRN, Nadara Mustard, MD   magnesium sulfate IVPB 2 g 50 mL, 2 g, Intravenous, Daily PRN, Nadara Mustard, MD   metFORMIN (GLUCOPHAGE-XR) 24 hr tablet 500 mg, 500 mg, Oral, BID AC, Nadara Mustard, MD, 500 mg at 07/23/23 1308   metoprolol tartrate (LOPRESSOR) injection 2-5 mg, 2-5 mg, Intravenous, Q2H PRN, Nadara Mustard, MD   nutrition supplement (JUVEN) (JUVEN) powder packet 1 packet, 1 packet, Oral, BID BM, Nadara Mustard, MD, 1 packet at 07/23/23 0819   ondansetron [facility]) injection 4 mg, 4 mg, Intravenous, Q6H PRN, Nadara Mustard, MD   Oral care mouth rinse, 15 mL, Mouth Rinse, PRN, Nadara Mustard, MD   oxyCODONE (Oxy IR/ROXICODONE) immediate release tablet 10-15 mg, 10-15 mg, Oral, Q4H PRN, Nadara Mustard, MD, 15 mg at 07/23/23 6578   oxyCODONE (Oxy IR/ROXICODONE) immediate release tablet 5-10 mg, 5-10 mg, Oral, Q4H PRN, Nadara Mustard, MD, 10 mg at 07/21/23 1348   pantoprazole (PROTONIX) EC tablet 40 mg, 40 mg, Oral, Daily, Nadara Mustard, MD, 40 mg at 07/23/23 0819   phenol (CHLORASEPTIC) mouth spray 1 spray, 1 spray, Mouth/Throat, PRN, Nadara Mustard, MD   polyethylene glycol (MIRALAX / GLYCOLAX) packet 17 g, 17 g, Oral, Daily PRN, Nadara Mustard, MD, 17 g at 07/18/23 0827   potassium chloride SA (KLOR-CON M) CR tablet 20-40 mEq, 20-40 mEq, Oral, Daily PRN, Nadara Mustard, MD   zinc sulfate capsule 220 mg, 220 mg, Oral, Daily, Nadara Mustard, MD, 220 mg at 07/23/23 0820  Patients Current Diet:  Diet Order             Diet - low sodium heart healthy           Diet Carb Modified Fluid consistency: Thin; Room service appropriate? Yes  Diet effective now                   Precautions /  Restrictions Precautions Precautions: Fall Precaution Comments: wound vac L residual limb Restrictions Weight Bearing Restrictions: Yes LLE Weight Bearing: Non weight bearing  Has the patient had 2 or more falls or a fall with injury in the past year? No  Prior Activity Level Limited Community (1-2x/wk): the last few months he has been limited by pain, using a RW, and requiring assist for ADLs  Prior Functional Level Self Care: Did the patient need help bathing, dressing, using the toilet or eating? Independent  Indoor Mobility: Did the patient need assistance with walking from room to room (with or without device)? Independent  Stairs: Did the patient need assistance with internal or external stairs (with or without device)? Independent  Functional Cognition: Did the patient need help planning regular tasks such as shopping or remembering to take medications? Independent  Patient Information Are you of Hispanic, Latino/a,or Spanish origin?: B. Yes, [place] American, Chicano/a What is your race?: A. White Do you need or want an interpreter to communicate with a doctor or health care staff?: 1. Yes  Patient's Response To:  Health Literacy and Transportation Is the patient able to respond to health literacy and transportation needs?: Yes Health Literacy - How often do you need to have someone help you when you read instructions, pamphlets, or other written material from your doctor or pharmacy?: Always In the past 12 months, has lack of transportation kept you from medical appointments or from getting medications?: No In the past 12 months, has lack of transportation kept you from meetings, work, or from getting things needed for daily living?: No  Home Assistive Devices / Equipment Home Assistive Devices/Equipment: Environmental consultant (specify type), Crutches, CBG Meter (urinal) Home Equipment: Rolling Walker (2 wheels), Crutches  Prior Device Use: Indicate devices/aids used by the  patient prior to current illness, exacerbation or injury? Walker  Current Functional Level Cognition  Overall Cognitive Status: Within Functional Limits for tasks assessed Difficult to assess due to: Non-English speaking Orientation Level: Oriented X4 Safety/Judgement: Decreased awareness of safety General Comments: cues for safe hand placement - but overall wfl    Extremity Assessment (includes Sensation/Coordination)  Upper Extremity Assessment: Overall WFL for tasks assessed  Lower Extremity Assessment: Defer to PT evaluation LLE Deficits / Details: s/p BKA 07/16/23 with wound vac attached; WFL AROM    ADLs  Overall ADL's : Needs assistance/impaired Eating/Feeding: Independent, Sitting Grooming: Set up, Sitting Upper Body Bathing: Supervision/ safety, Sitting Lower Body Bathing: Moderate assistance, Sitting/lateral leans Upper Body Dressing : Supervision/safety, Sitting Lower Body Dressing: Minimal assistance, Sitting/lateral leans Lower Body Dressing Details (indicate cue type and reason): donning sock Toilet Transfer: Minimal assistance, Cueing for safety, Ambulation, Rolling walker (2 wheels) Toilet Transfer Details (indicate cue type and reason): into bathroom and sitting on toilet Toileting- Clothing Manipulation and Hygiene: Minimal assistance, Sitting/lateral lean Functional mobility during ADLs: Minimal assistance, Rolling walker (2 wheels) General ADL Comments: Educated pt and daughter in completing bathing and dressing in sitting, leaning side to side, benefits of 3 in 1 and tub transfer bench.    Mobility  Overal bed mobility: Needs Assistance Bed Mobility: Sit to Supine Supine to sit: Min assist, HOB elevated Sit to supine: Contact guard assist General bed mobility comments: line management assist    Transfers  Overall transfer level: Needs assistance Equipment used: Rolling walker (2 wheels) Transfers: Sit to/from Stand Sit to Stand: Contact guard assist, Min  assist, From elevated surface General transfer comment: cues for safe hand placement and assist for balance    Ambulation / Gait / Stairs / Wheelchair Mobility  Ambulation/Gait Ambulation/Gait assistance: Min assist, Mod assist Gait Distance (Feet): 25 Feet (  including up and down small incline in and out of bathroom) Assistive device: Rolling walker (2 wheels) Gait Pattern/deviations:  (Hop-to gait) General Gait Details: pt with short hop-to gait, minmal foot clearance. PT cues to ensure increased proximity to chair/bed prior to initiating stand to sit Gait velocity: reduced Gait velocity interpretation: <1.31 ft/sec, indicative of household ambulator    Posture / Balance Balance Overall balance assessment: Needs assistance Sitting-balance support: No upper extremity supported, Feet supported Sitting balance-Leahy Scale: Good Standing balance support: Bilateral upper extremity supported, Reliant on assistive device for balance Standing balance-Leahy Scale: Poor Standing balance comment: Reliant on RW and min assist    Special needs/care consideration Wound Vac yes LLE, Skin L BKA incision, and Diabetic management yes   Previous Home Environment (from acute therapy documentation) Living Arrangements: Spouse/significant other, Children (daughter) Available Help at Discharge: Family, Available 24 hours/day Type of Home: House Home Layout: One level Home Access: Stairs to enter Entrance Stairs-Rails: None Entrance Stairs-Number of Steps: 5 Bathroom Shower/Tub: Engineer, manufacturing systems: Standard Bathroom Accessibility: Yes (to RW) Home Care Services: No  Discharge Living Setting Plans for Discharge Living Setting: Patient's home, Lives with (comment) (spouse and daughters) Type of Home at Discharge: House Discharge Home Layout: One level Discharge Home Access: Stairs to enter Entrance Stairs-Rails: None Entrance Stairs-Number of Steps: 5 Discharge Bathroom Shower/Tub:  Tub/shower unit Discharge Bathroom Toilet: Standard Discharge Bathroom Accessibility: Yes How Accessible: Accessible via walker Does the patient have any problems obtaining your medications?: Yes (Describe) (uninsured)  Social/Family/Support Systems Patient Roles: Spouse Anticipated Caregiver: daughter, Patrick [name], is primary contact Anticipated Caregiver's Contact Information: [phone] Ability/Limitations of Caregiver: none stated, though pt hopes he will not require any assist at discharge Caregiver Availability: 24/7 Discharge Plan Discussed with Primary Caregiver: Yes Is Caregiver In Agreement with Plan?: Yes Does Caregiver/Family have Issues with Lodging/Transportation while Pt is in Rehab?: No  Goals Patient/Family Goal for Rehab: PT/OT mod I w/c level, supervision ambulation Expected length of stay: 10-12 days Cultural Considerations: needs spanish interpreter Additional Information: DIscharge plan: home to pt's home with family providing 24/7 supervision if needed Pt/Family Agrees to Admission and willing to participate: Yes Program Orientation Provided & Reviewed with Pt/Caregiver Including Roles  & Responsibilities: Yes  Barriers to Discharge: Insurance for SNF coverage, Home environment access/layout  Decrease burden of Care through IP rehab admission: n/a  Possible need for SNF placement upon discharge: Not anticipated.  Pt with good family support and expected to reach mod I w/c level.  Home with spouse and children.   Patient Condition: I have reviewed [facility], spoken with Patrick, and patient and daughter. I met with patient at the bedside for inpatient rehabilitation assessment.  Patient will benefit from ongoing PT and OT, can actively participate in 3 hours of therapy a day 5 days of the week, and can make measurable gains during the admission.  Patient will also benefit from the coordinated team approach during an Inpatient Acute Rehabilitation  admission.  The patient will receive intensive therapy as well as Rehabilitation physician, nursing, social worker, and care management interventions.  Due to safety, skin/wound care, disease management, medication administration, pain management, and patient education the patient requires 24 hour a day rehabilitation nursing.  The patient is currently min assist with mobility and basic ADLs.  Discharge setting and therapy post discharge at home with outpatient is anticipated.  Patient has agreed to participate in the Acute Inpatient Rehabilitation Program and will admit today.  Preadmission  Screen Completed By:  Stephania Fragmin, PT, DPT 07/23/2023 11:37 AM ______________________________________________________________________   Discussed status with Dr. Carlis Abbott  on 07/23/23 . at now  and received approval for admission today.  Admission Coordinator:  Stephania Fragmin, PT, DPT time 11:37 AM Dorna Bloom 07/23/23    Assessment/Plan: Diagnosis: L BKA Does the need for close, 24 hr/day Medical supervision in concert with the patient's rehab needs make it unreasonable for this patient to be served in a less intensive setting? Yes Co-Morbidities requiring supervision/potential complications: DM, HTN, PVD, knee cellulitis, history of alcohol intoxication Due to bladder management, bowel management, safety, skin/wound care, disease management, medication administration, pain management, and patient education, does the patient require 24 hr/day rehab nursing? Yes Does the patient require coordinated care of a physician, rehab nurse, PT, OT, and SLP to address physical and functional deficits in the context of the above medical diagnosis(es)? Yes Addressing deficits in the following areas: balance, endurance, locomotion, strength, transferring, bowel/bladder control, bathing, dressing, feeding, grooming, toileting, and psychosocial support Can the patient actively participate in an intensive therapy program of at  least 3 hrs of therapy 5 days a week? Yes The potential for patient to make measurable gains while on inpatient rehab is excellent Anticipated functional outcomes upon discharge from inpatient rehab: supervision PT, supervision OT, supervision SLP Estimated rehab length of stay to reach the above functional goals is: 7 days Anticipated discharge destination: Home 10. Overall Rehab/Functional Prognosis: excellent   MD Signature: Sula Soda, MD

## 2023-07-21 NOTE — Plan of Care (Signed)
  Problem: Education: Goal: Knowledge of the prescribed therapeutic regimen will improve Outcome: Progressing Goal: Ability to verbalize activity precautions or restrictions will improve Outcome: Progressing Goal: Understanding of discharge needs will improve Outcome: Progressing   Problem: Activity: Goal: Ability to perform//tolerate increased activity and mobilize with assistive devices will improve Outcome: Progressing   Problem: Clinical Measurements: Goal: Postoperative complications will be avoided or minimized Outcome: Progressing   Problem: Self-Care: Goal: Ability to meet self-care needs will improve Outcome: Progressing   Problem: Self-Concept: Goal: Ability to maintain and perform role responsibilities to the fullest extent possible will improve Outcome: Progressing   Problem: Pain Management: Goal: Pain level will decrease with appropriate interventions Outcome: Progressing   Problem: Skin Integrity: Goal: Demonstration of wound healing without infection will improve Outcome: Progressing   Problem: Education: Goal: Understanding of CV disease, CV risk reduction, and recovery process will improve Outcome: Progressing Goal: Individualized Educational Video(s) Outcome: Progressing   Problem: Activity: Goal: Ability to return to baseline activity level will improve Outcome: Progressing   Problem: Cardiovascular: Goal: Ability to achieve and maintain adequate cardiovascular perfusion will improve Outcome: Progressing   Problem: Education: Goal: Ability to describe self-care measures that may prevent or decrease complications (Diabetes Survival Skills Education) will improve Outcome: Progressing Goal: Individualized Educational Video(s) Outcome: Progressing   Problem: Coping: Goal: Ability to adjust to condition or change in health will improve Outcome: Progressing   Problem: Health Behavior/Discharge Planning: Goal: Ability to identify and utilize  available resources and services will improve Outcome: Progressing Goal: Ability to manage health-related needs will improve Outcome: Progressing   Problem: Nutritional: Goal: Maintenance of adequate nutrition will improve Outcome: Progressing Goal: Progress toward achieving an optimal weight will improve Outcome: Progressing   Problem: Skin Integrity: Goal: Risk for impaired skin integrity will decrease Outcome: Progressing   Problem: Education: Goal: Knowledge of General Education information will improve Description: Including pain rating scale, medication(s)/side effects and non-pharmacologic comfort measures Outcome: Progressing   Problem: Health Behavior/Discharge Planning: Goal: Ability to manage health-related needs will improve Outcome: Progressing   Problem: Clinical Measurements: Goal: Ability to maintain clinical measurements within normal limits will improve Outcome: Progressing Goal: Will remain free from infection Outcome: Progressing Goal: Diagnostic test results will improve Outcome: Progressing Goal: Respiratory complications will improve Outcome: Progressing Goal: Cardiovascular complication will be avoided Outcome: Progressing   Problem: Activity: Goal: Risk for activity intolerance will decrease Outcome: Progressing   Problem: Nutrition: Goal: Adequate nutrition will be maintained Outcome: Progressing   Problem: Elimination: Goal: Will not experience complications related to bowel motility Outcome: Progressing Goal: Will not experience complications related to urinary retention Outcome: Progressing   Problem: Safety: Goal: Ability to remain free from injury will improve Outcome: Progressing   Problem: Skin Integrity: Goal: Risk for impaired skin integrity will decrease Outcome: Progressing

## 2023-07-22 LAB — GLUCOSE, CAPILLARY
Glucose-Capillary: 114 mg/dL — ABNORMAL HIGH (ref 70–99)
Glucose-Capillary: 95 mg/dL (ref 70–99)
Glucose-Capillary: 105 mg/dL — ABNORMAL HIGH (ref 70–99)
Glucose-Capillary: 168 mg/dL — ABNORMAL HIGH (ref 70–99)

## 2023-07-22 NOTE — Progress Notes (Signed)
Inpatient Rehab Admissions Coordinator:   I have no beds available for this patient to admit to CIR today.  Will continue to follow for timing of potential admission pending bed availability.   Estill Dooms, PT, DPT Admissions Coordinator [phone] 07/22/23  9:40 AM

## 2023-07-22 NOTE — Progress Notes (Signed)
Patient ID: Patrick [name], male   DOB: Feb 21, 1946, 77 y.o.   MRN: [id number] Family at bedside.  Patient without complaints.  Patient awaiting discharge to inpatient rehab when bed available.

## 2023-07-22 NOTE — Progress Notes (Signed)
Mobility Specialist: Progress Note   07/22/23 1428  Mobility  Activity Ambulated with assistance in hallway  Level of Assistance Minimal assist, patient does 75% or more  Assistive Device Front wheel walker  Distance Ambulated (ft) 30 ft  LLE Weight Bearing NWB  Activity Response Tolerated well  Mobility Referral Yes  $Mobility charge 1 Mobility  Mobility Specialist Start Time (ACUTE ONLY) 1256  Mobility Specialist Stop Time (ACUTE ONLY) 1309  Mobility Specialist Time Calculation (min) (ACUTE ONLY) 13 min    Pt was agreeable to mobility session - received in chair. Had c/o LLE pain rated 5/10 that decreased to 3/10 by EOS. CG for STS. Pt was initially CG for ambulation but became minA towards EOS d/t UE and RLE fatigue and pain. Returned to room without fault. Left on EOB with all needs met, call bell in reach.   Maurene Capes Mobility Specialist Please contact via SecureChat or Rehab office at [phone]

## 2023-07-22 NOTE — Plan of Care (Signed)
Problem: Education: Goal: Knowledge of the prescribed therapeutic regimen will improve Outcome: Progressing Goal: Ability to verbalize activity precautions or restrictions will improve Outcome: Progressing Goal: Understanding of discharge needs will improve Outcome: Progressing   Problem: Activity: Goal: Ability to perform//tolerate increased activity and mobilize with assistive devices will improve Outcome: Progressing   Problem: Clinical Measurements: Goal: Postoperative complications will be avoided or minimized Outcome: Progressing   Problem: Self-Care: Goal: Ability to meet self-care needs will improve Outcome: Progressing   Problem: Self-Concept: Goal: Ability to maintain and perform role responsibilities to the fullest extent possible will improve Outcome: Progressing   Problem: Pain Management: Goal: Pain level will decrease with appropriate interventions Outcome: Progressing   Problem: Skin Integrity: Goal: Demonstration of wound healing without infection will improve Outcome: Progressing   Problem: Education: Goal: Understanding of CV disease, CV risk reduction, and recovery process will improve Outcome: Progressing Goal: Individualized Educational Video(s) Outcome: Progressing   Problem: Activity: Goal: Ability to return to baseline activity level will improve Outcome: Progressing   Problem: Cardiovascular: Goal: Ability to achieve and maintain adequate cardiovascular perfusion will improve Outcome: Progressing   Problem: Education: Goal: Ability to describe self-care measures that may prevent or decrease complications (Diabetes Survival Skills Education) will improve Outcome: Progressing Goal: Individualized Educational Video(s) Outcome: Progressing   Problem: Coping: Goal: Ability to adjust to condition or change in health will improve Outcome: Progressing   Problem: Health Behavior/Discharge Planning: Goal: Ability to identify and utilize  available resources and services will improve Outcome: Progressing Goal: Ability to manage health-related needs will improve Outcome: Progressing   Problem: Nutritional: Goal: Maintenance of adequate nutrition will improve Outcome: Progressing Goal: Progress toward achieving an optimal weight will improve Outcome: Progressing   Problem: Skin Integrity: Goal: Risk for impaired skin integrity will decrease Outcome: Progressing   Problem: Education: Goal: Knowledge of General Education information will improve Description: Including pain rating scale, medication(s)/side effects and non-pharmacologic comfort measures Outcome: Progressing   Problem: Health Behavior/Discharge Planning: Goal: Ability to manage health-related needs will improve Outcome: Progressing   Problem: Clinical Measurements: Goal: Ability to maintain clinical measurements within normal limits will improve Outcome: Progressing Goal: Will remain free from infection Outcome: Progressing Goal: Diagnostic test results will improve Outcome: Progressing Goal: Respiratory complications will improve Outcome: Progressing Goal: Cardiovascular complication will be avoided Outcome: Progressing   Problem: Activity: Goal: Risk for activity intolerance will decrease Outcome: Progressing   Problem: Nutrition: Goal: Adequate nutrition will be maintained Outcome: Progressing   Problem: Elimination: Goal: Will not experience complications related to bowel motility Outcome: Progressing Goal: Will not experience complications related to urinary retention Outcome: Progressing   Problem: Safety: Goal: Ability to remain free from injury will improve Outcome: Progressing   Problem: Skin Integrity: Goal: Risk for impaired skin integrity will decrease Outcome: Progressing

## 2023-07-23 ENCOUNTER — Inpatient Hospital Stay (HOSPITAL_COMMUNITY)
Admission: AD | Admit: 2023-07-23 | Discharge: 2023-08-03 | DRG: 560 | Disposition: A | Discharge status: Home / Self Care | Payer: Self-pay | Source: Intra-hospital | Attending: Physical Medicine & Rehabilitation | Admitting: Physical Medicine & Rehabilitation

## 2023-07-23 ENCOUNTER — Encounter (HOSPITAL_COMMUNITY): Payer: Self-pay | Admitting: Physical Medicine & Rehabilitation

## 2023-07-23 ENCOUNTER — Encounter (HOSPITAL_COMMUNITY): Payer: Self-pay | Admitting: Orthopedic Surgery

## 2023-07-23 ENCOUNTER — Other Ambulatory Visit: Payer: Self-pay

## 2023-07-23 DIAGNOSIS — Z79899 Other long term (current) drug therapy: Secondary | ICD-10-CM

## 2023-07-23 DIAGNOSIS — K5903 Drug induced constipation: Secondary | ICD-10-CM | POA: Diagnosis present

## 2023-07-23 DIAGNOSIS — Z7902 Long term (current) use of antithrombotics/antiplatelets: Secondary | ICD-10-CM

## 2023-07-23 DIAGNOSIS — Z7982 Long term (current) use of aspirin: Secondary | ICD-10-CM

## 2023-07-23 DIAGNOSIS — R7989 Other specified abnormal findings of blood chemistry: Secondary | ICD-10-CM | POA: Diagnosis present

## 2023-07-23 DIAGNOSIS — Z794 Long term (current) use of insulin: Secondary | ICD-10-CM

## 2023-07-23 DIAGNOSIS — S88112D Complete traumatic amputation at level between knee and ankle, left lower leg, subsequent encounter: Secondary | ICD-10-CM

## 2023-07-23 DIAGNOSIS — E871 Hypo-osmolality and hyponatremia: Secondary | ICD-10-CM | POA: Diagnosis present

## 2023-07-23 DIAGNOSIS — D62 Acute posthemorrhagic anemia: Secondary | ICD-10-CM | POA: Diagnosis present

## 2023-07-23 DIAGNOSIS — Z4781 Encounter for orthopedic aftercare following surgical amputation: Principal | ICD-10-CM

## 2023-07-23 DIAGNOSIS — Z833 Family history of diabetes mellitus: Secondary | ICD-10-CM

## 2023-07-23 DIAGNOSIS — I10 Essential (primary) hypertension: Secondary | ICD-10-CM | POA: Diagnosis present

## 2023-07-23 DIAGNOSIS — Z7984 Long term (current) use of oral hypoglycemic drugs: Secondary | ICD-10-CM

## 2023-07-23 DIAGNOSIS — D638 Anemia in other chronic diseases classified elsewhere: Secondary | ICD-10-CM | POA: Diagnosis present

## 2023-07-23 DIAGNOSIS — G546 Phantom limb syndrome with pain: Secondary | ICD-10-CM | POA: Diagnosis present

## 2023-07-23 DIAGNOSIS — L89611 Pressure ulcer of right heel, stage 1: Secondary | ICD-10-CM | POA: Diagnosis present

## 2023-07-23 DIAGNOSIS — Z7985 Long-term (current) use of injectable non-insulin antidiabetic drugs: Secondary | ICD-10-CM

## 2023-07-23 DIAGNOSIS — K625 Hemorrhage of anus and rectum: Secondary | ICD-10-CM | POA: Diagnosis not present

## 2023-07-23 DIAGNOSIS — K5900 Constipation, unspecified: Secondary | ICD-10-CM | POA: Diagnosis present

## 2023-07-23 DIAGNOSIS — E1151 Type 2 diabetes mellitus with diabetic peripheral angiopathy without gangrene: Secondary | ICD-10-CM | POA: Diagnosis present

## 2023-07-23 DIAGNOSIS — S88112A Complete traumatic amputation at level between knee and ankle, left lower leg, initial encounter: Principal | ICD-10-CM | POA: Diagnosis present

## 2023-07-23 DIAGNOSIS — Z9862 Peripheral vascular angioplasty status: Secondary | ICD-10-CM

## 2023-07-23 DIAGNOSIS — T402X5D Adverse effect of other opioids, subsequent encounter: Secondary | ICD-10-CM

## 2023-07-23 DIAGNOSIS — Z7409 Other reduced mobility: Secondary | ICD-10-CM | POA: Diagnosis present

## 2023-07-23 DIAGNOSIS — D509 Iron deficiency anemia, unspecified: Secondary | ICD-10-CM | POA: Diagnosis present

## 2023-07-23 DIAGNOSIS — E1165 Type 2 diabetes mellitus with hyperglycemia: Secondary | ICD-10-CM

## 2023-07-23 DIAGNOSIS — Z89512 Acquired absence of left leg below knee: Secondary | ICD-10-CM

## 2023-07-23 DIAGNOSIS — R52 Pain, unspecified: Secondary | ICD-10-CM | POA: Insufficient documentation

## 2023-07-23 LAB — GLUCOSE, CAPILLARY
Glucose-Capillary: 213 mg/dL — ABNORMAL HIGH (ref 70–99)
Glucose-Capillary: 120 mg/dL — ABNORMAL HIGH (ref 70–99)
Glucose-Capillary: 108 mg/dL — ABNORMAL HIGH (ref 70–99)
Glucose-Capillary: 103 mg/dL — ABNORMAL HIGH (ref 70–99)

## 2023-07-23 MED ORDER — ASPIRIN 81 MG PO TBEC
81.0000 mg | DELAYED_RELEASE_TABLET | Freq: Every day | ORAL | Status: DC
  Administered 2023-07-24 – 2023-08-01 (×9): 81 mg via ORAL
  Filled 2023-07-23 (×9): qty 1

## 2023-07-23 MED ORDER — EMPAGLIFLOZIN 25 MG PO TABS
25.0000 mg | ORAL_TABLET | Freq: Every day | ORAL | Status: DC
  Administered 2023-07-24 – 2023-08-03 (×11): 25 mg via ORAL
  Filled 2023-07-23 (×14): qty 1

## 2023-07-23 MED ORDER — FLEET ENEMA RE ENEM
1.0000 | ENEMA | Freq: Once | RECTAL | Status: AC | PRN
  Administered 2023-07-24: 1 via RECTAL
  Filled 2023-07-23: qty 1

## 2023-07-23 MED ORDER — DOCUSATE SODIUM 100 MG PO CAPS
100.0000 mg | ORAL_CAPSULE | Freq: Every day | ORAL | Status: DC
  Administered 2023-07-24 – 2023-08-01 (×9): 100 mg via ORAL
  Filled 2023-07-23 (×9): qty 1

## 2023-07-23 MED ORDER — DULAGLUTIDE 0.75 MG/0.5ML ~~LOC~~ SOAJ: 0.7500 mg | SUBCUTANEOUS | Status: DC

## 2023-07-23 MED ORDER — PROCHLORPERAZINE EDISYLATE 10 MG/2ML IJ SOLN: 5.0000 mg | Freq: Four times a day (QID) | INTRAMUSCULAR | Status: DC | PRN

## 2023-07-23 MED ORDER — ENOXAPARIN SODIUM 40 MG/0.4ML IJ SOSY
40.0000 mg | PREFILLED_SYRINGE | INTRAMUSCULAR | Status: DC
  Administered 2023-07-23 – 2023-08-01 (×10): 40 mg via SUBCUTANEOUS
  Filled 2023-07-23 (×9): qty 0.4

## 2023-07-23 MED ORDER — METFORMIN HCL ER 500 MG PO TB24
500.0000 mg | ORAL_TABLET | Freq: Two times a day (BID) | ORAL | Status: DC
  Administered 2023-07-24 – 2023-08-03 (×21): 500 mg via ORAL
  Filled 2023-07-23 (×22): qty 1

## 2023-07-23 MED ORDER — PANTOPRAZOLE SODIUM 40 MG PO TBEC
40.0000 mg | DELAYED_RELEASE_TABLET | Freq: Every day | ORAL | Status: DC
  Administered 2023-07-24 – 2023-08-03 (×11): 40 mg via ORAL
  Filled 2023-07-23 (×11): qty 1

## 2023-07-23 MED ORDER — SENNOSIDES-DOCUSATE SODIUM 8.6-50 MG PO TABS
2.0000 | ORAL_TABLET | Freq: Every day | ORAL | Status: DC
  Administered 2023-07-24 – 2023-07-26 (×3): 2 via ORAL
  Filled 2023-07-23 (×3): qty 2

## 2023-07-23 MED ORDER — INSULIN ASPART 100 UNIT/ML IJ SOLN
0.0000 [IU] | Freq: Every day | INTRAMUSCULAR | Status: DC
  Administered 2023-07-24: 2 [IU] via SUBCUTANEOUS

## 2023-07-23 MED ORDER — INSULIN ASPART 100 UNIT/ML IJ SOLN
0.0000 [IU] | Freq: Three times a day (TID) | INTRAMUSCULAR | Status: DC
  Administered 2023-07-24: 2 [IU] via SUBCUTANEOUS
  Administered 2023-07-24 – 2023-07-28 (×4): 1 [IU] via SUBCUTANEOUS
  Administered 2023-07-30: 2 [IU] via SUBCUTANEOUS
  Administered 2023-07-30 – 2023-07-31 (×2): 1 [IU] via SUBCUTANEOUS

## 2023-07-23 MED ORDER — ORAL CARE MOUTH RINSE: 15.0000 mL | OROMUCOSAL | Status: DC | PRN

## 2023-07-23 MED ORDER — CLOPIDOGREL BISULFATE 75 MG PO TABS
75.0000 mg | ORAL_TABLET | Freq: Every day | ORAL | Status: DC
  Administered 2023-07-24 – 2023-08-01 (×9): 75 mg via ORAL
  Filled 2023-07-23 (×9): qty 1

## 2023-07-23 MED ORDER — ENSURE MAX PROTEIN PO LIQD
11.0000 [oz_av] | Freq: Two times a day (BID) | ORAL | Status: DC
  Administered 2023-07-23 – 2023-08-03 (×22): 11 [oz_av] via ORAL

## 2023-07-23 MED ORDER — BISACODYL 10 MG RE SUPP
10.0000 mg | Freq: Every day | RECTAL | Status: DC | PRN
  Administered 2023-08-01: 10 mg via RECTAL
  Filled 2023-07-23 (×2): qty 1

## 2023-07-23 MED ORDER — BENEPROTEIN PO POWD
1.0000 | Freq: Three times a day (TID) | ORAL | Status: DC
  Administered 2023-07-24 – 2023-08-02 (×26): 6 g via ORAL
  Filled 2023-07-23 (×2): qty 227

## 2023-07-23 MED ORDER — OXYCODONE HCL ER 10 MG PO T12A
10.0000 mg | EXTENDED_RELEASE_TABLET | Freq: Two times a day (BID) | ORAL | Status: DC
  Administered 2023-07-23 – 2023-07-30 (×15): 10 mg via ORAL
  Filled 2023-07-23 (×17): qty 1

## 2023-07-23 MED ORDER — MELATONIN 5 MG PO TABS: 5.0000 mg | ORAL_TABLET | Freq: Every evening | ORAL | Status: DC | PRN

## 2023-07-23 MED ORDER — GUAIFENESIN-DM 100-10 MG/5ML PO SYRP: 5.0000 mL | ORAL_SOLUTION | Freq: Four times a day (QID) | ORAL | Status: DC | PRN

## 2023-07-23 MED ORDER — MAGNESIUM HYDROXIDE 400 MG/5ML PO SUSP
30.0000 mL | Freq: Once | ORAL | Status: AC
  Administered 2023-07-23: 30 mL via ORAL
  Filled 2023-07-23: qty 30

## 2023-07-23 MED ORDER — OXYCODONE HCL 5 MG PO TABS
5.0000 mg | ORAL_TABLET | ORAL | Status: DC | PRN
  Administered 2023-07-25: 10 mg via ORAL
  Administered 2023-07-26 – 2023-07-27 (×3): 5 mg via ORAL
  Administered 2023-07-28: 10 mg via ORAL
  Administered 2023-07-28: 5 mg via ORAL
  Administered 2023-07-29 – 2023-07-31 (×2): 10 mg via ORAL
  Administered 2023-08-01: 5 mg via ORAL
  Filled 2023-07-23: qty 2
  Filled 2023-07-23: qty 1
  Filled 2023-07-23: qty 2
  Filled 2023-07-23: qty 1
  Filled 2023-07-23: qty 2
  Filled 2023-07-23: qty 1
  Filled 2023-07-23: qty 2
  Filled 2023-07-23: qty 1
  Filled 2023-07-23: qty 2
  Filled 2023-07-23: qty 1
  Filled 2023-07-23: qty 2

## 2023-07-23 MED ORDER — ATORVASTATIN CALCIUM 80 MG PO TABS
80.0000 mg | ORAL_TABLET | Freq: Every day | ORAL | Status: DC
  Administered 2023-07-24 – 2023-08-03 (×11): 80 mg via ORAL
  Filled 2023-07-23 (×11): qty 1

## 2023-07-23 MED ORDER — ZINC SULFATE 220 (50 ZN) MG PO CAPS
220.0000 mg | ORAL_CAPSULE | Freq: Every day | ORAL | Status: AC
  Administered 2023-07-24 – 2023-07-29 (×6): 220 mg via ORAL
  Filled 2023-07-23 (×6): qty 1

## 2023-07-23 MED ORDER — ALUM & MAG HYDROXIDE-SIMETH 200-200-20 MG/5ML PO SUSP
30.0000 mL | ORAL | Status: DC | PRN
  Filled 2023-07-23: qty 30

## 2023-07-23 MED ORDER — ACETAMINOPHEN 325 MG PO TABS: 325.0000 mg | ORAL_TABLET | ORAL | Status: DC | PRN

## 2023-07-23 MED ORDER — DIPHENHYDRAMINE HCL 25 MG PO CAPS: 25.0000 mg | ORAL_CAPSULE | Freq: Four times a day (QID) | ORAL | Status: DC | PRN

## 2023-07-23 MED ORDER — GABAPENTIN 100 MG PO CAPS
100.0000 mg | ORAL_CAPSULE | Freq: Three times a day (TID) | ORAL | Status: DC
  Administered 2023-07-23 – 2023-07-25 (×6): 100 mg via ORAL
  Filled 2023-07-23 (×6): qty 1

## 2023-07-23 MED ORDER — JUVEN PO PACK
1.0000 | PACK | Freq: Two times a day (BID) | ORAL | Status: DC
  Administered 2023-07-24 – 2023-08-03 (×21): 1 via ORAL
  Filled 2023-07-23 (×22): qty 1

## 2023-07-23 MED ORDER — INSULIN ASPART 100 UNIT/ML IJ SOLN: 4.0000 [IU] | Freq: Three times a day (TID) | INTRAMUSCULAR | Status: DC

## 2023-07-23 MED ORDER — HYDROCHLOROTHIAZIDE 25 MG PO TABS
25.0000 mg | ORAL_TABLET | Freq: Every day | ORAL | Status: DC
  Administered 2023-07-24: 25 mg via ORAL
  Filled 2023-07-23: qty 1

## 2023-07-23 MED ORDER — LISINOPRIL 20 MG PO TABS
20.0000 mg | ORAL_TABLET | Freq: Every day | ORAL | Status: DC
  Administered 2023-07-24 – 2023-08-03 (×10): 20 mg via ORAL
  Filled 2023-07-23 (×11): qty 1

## 2023-07-23 MED ORDER — PROCHLORPERAZINE MALEATE 5 MG PO TABS
5.0000 mg | ORAL_TABLET | Freq: Four times a day (QID) | ORAL | Status: DC | PRN
  Filled 2023-07-23: qty 2

## 2023-07-23 MED ORDER — PROCHLORPERAZINE 25 MG RE SUPP: 12.5000 mg | Freq: Four times a day (QID) | RECTAL | Status: DC | PRN

## 2023-07-23 MED ORDER — VITAMIN C 500 MG PO TABS
1000.0000 mg | ORAL_TABLET | Freq: Every day | ORAL | Status: DC
  Administered 2023-07-24 – 2023-08-03 (×11): 1000 mg via ORAL
  Filled 2023-07-23 (×12): qty 2

## 2023-07-23 NOTE — Discharge Summary (Signed)
Physician Discharge Summary  Patient ID: Patrick [name] MRN: [id number] DOB/AGE: [birth year] 77 y.o.  Admit date: 07/16/2023 Discharge date: 07/23/2023  Admission Diagnoses:  Principal Problem:   Gangrene of left foot (HCC) Active Problems:   S/P BKA (below knee amputation) unilateral, left [name]   Discharge Diagnoses: Left BKA Same  Past Medical History:  Diagnosis Date   Diabetes mellitus without complication (HCC)    Hypertension    Peripheral vascular disease (HCC)     Surgeries: Procedure(s): LEFT BELOW KNEE AMPUTATION APPLICATION OF WOUND VAC on 07/16/2023   Consultants:   Discharged Condition: Improved  Hospital Course: Patrick [name] is an 77 y.o. male who was admitted 07/16/2023 with a chief complaint of No chief complaint on file. , and found to have a diagnosis of Gangrene of left foot (HCC).  They were brought to the operating room on 07/16/2023 and underwent the above named procedures.    They were given perioperative antibiotics:  Anti-infectives (From admission, onward)    Start     Dose/Rate Route Frequency Ordered Stop   07/16/23 1430  ceFAZolin (ANCEF) IVPB 2g/100 mL premix        2 g 200 mL/hr over 30 Minutes Intravenous Every 8 hours 07/16/23 1333 07/16/23 2227   07/16/23 0700  ceFAZolin (ANCEF) IVPB 2g/100 mL premix        2 g 200 mL/hr over 30 Minutes Intravenous On call to O.R. 07/16/23 4098 07/16/23 1191     .  They were given compression stockings, early ambulation, and chemoprophylaxis for DVT prophylaxis.  They benefited maximally from their hospital stay and there were no complications.    Recent vital signs:  Vitals:   07/23/23 0442 07/23/23 0823  BP: 133/72 129/69  Pulse: 78 87  Resp: 14 18  Temp:  98 F (36.7 C)  SpO2: 100% 100%    Recent laboratory studies:  Results for orders placed or performed during the hospital encounter of 07/16/23  Basic metabolic panel per protocol  Result Value Ref Range   Sodium 131 (L)  135 - 145 mmol/L   Potassium 4.2 3.5 - 5.1 mmol/L   Chloride 93 (L) 98 - 111 mmol/L   CO2 22 22 - 32 mmol/L   Glucose, Bld 138 (H) 70 - 99 mg/dL   BUN 12 8 - 23 mg/dL   Creatinine, Ser 4.78 (L) 0.61 - 1.24 mg/dL   Calcium 8.9 8.9 - 29.5 mg/dL   GFR, Estimated >62 >13 mL/min   Anion gap 16 (H) 5 - 15  CBC per protocol  Result Value Ref Range   WBC 8.0 4.0 - 10.5 K/uL   RBC 3.94 (L) 4.22 - 5.81 MIL/uL   Hemoglobin 10.4 (L) 13.0 - 17.0 g/dL   HCT 08.6 (L) 57.8 - 46.9 %   MCV 83.5 80.0 - 100.0 fL   MCH 26.4 26.0 - 34.0 pg   MCHC 31.6 30.0 - 36.0 g/dL   RDW 62.9 52.8 - 41.3 %   Platelets 328 150 - 400 K/uL   nRBC 0.0 0.0 - 0.2 %  Glucose, capillary  Result Value Ref Range   Glucose-Capillary 149 (H) 70 - 99 mg/dL  Glucose, capillary  Result Value Ref Range   Glucose-Capillary 130 (H) 70 - 99 mg/dL  Glucose, capillary  Result Value Ref Range   Glucose-Capillary 103 (H) 70 - 99 mg/dL  Glucose, capillary  Result Value Ref Range   Glucose-Capillary 127 (H) 70 - 99 mg/dL  Glucose, capillary  Result Value Ref  Range   Glucose-Capillary 165 (H) 70 - 99 mg/dL  Glucose, capillary  Result Value Ref Range   Glucose-Capillary 129 (H) 70 - 99 mg/dL  Glucose, capillary  Result Value Ref Range   Glucose-Capillary 133 (H) 70 - 99 mg/dL  Glucose, capillary  Result Value Ref Range   Glucose-Capillary 128 (H) 70 - 99 mg/dL  Glucose, capillary  Result Value Ref Range   Glucose-Capillary 106 (H) 70 - 99 mg/dL  Glucose, capillary  Result Value Ref Range   Glucose-Capillary 146 (H) 70 - 99 mg/dL  Glucose, capillary  Result Value Ref Range   Glucose-Capillary 145 (H) 70 - 99 mg/dL  Glucose, capillary  Result Value Ref Range   Glucose-Capillary 151 (H) 70 - 99 mg/dL   Comment 1 Notify RN    Comment 2 Document in Chart   Glucose, capillary  Result Value Ref Range   Glucose-Capillary 131 (H) 70 - 99 mg/dL  Glucose, capillary  Result Value Ref Range   Glucose-Capillary 135 (H) 70 - 99  mg/dL  Glucose, capillary  Result Value Ref Range   Glucose-Capillary 132 (H) 70 - 99 mg/dL  Glucose, capillary  Result Value Ref Range   Glucose-Capillary 107 (H) 70 - 99 mg/dL  Glucose, capillary  Result Value Ref Range   Glucose-Capillary 144 (H) 70 - 99 mg/dL  Glucose, capillary  Result Value Ref Range   Glucose-Capillary 130 (H) 70 - 99 mg/dL  Glucose, capillary  Result Value Ref Range   Glucose-Capillary 150 (H) 70 - 99 mg/dL  Glucose, capillary  Result Value Ref Range   Glucose-Capillary 104 (H) 70 - 99 mg/dL  Glucose, capillary  Result Value Ref Range   Glucose-Capillary 137 (H) 70 - 99 mg/dL  Glucose, capillary  Result Value Ref Range   Glucose-Capillary 194 (H) 70 - 99 mg/dL  Glucose, capillary  Result Value Ref Range   Glucose-Capillary 100 (H) 70 - 99 mg/dL  Glucose, capillary  Result Value Ref Range   Glucose-Capillary 121 (H) 70 - 99 mg/dL  Glucose, capillary  Result Value Ref Range   Glucose-Capillary 98 70 - 99 mg/dL  Glucose, capillary  Result Value Ref Range   Glucose-Capillary 114 (H) 70 - 99 mg/dL  Glucose, capillary  Result Value Ref Range   Glucose-Capillary 95 70 - 99 mg/dL  Glucose, capillary  Result Value Ref Range   Glucose-Capillary 105 (H) 70 - 99 mg/dL  Glucose, capillary  Result Value Ref Range   Glucose-Capillary 168 (H) 70 - 99 mg/dL  Glucose, capillary  Result Value Ref Range   Glucose-Capillary 213 (H) 70 - 99 mg/dL  Surgical pathology  Result Value Ref Range   SURGICAL PATHOLOGY      SURGICAL PATHOLOGY CASE: [id number] PATIENT: Patrick [name] Surgical Pathology Report     Clinical History: gangrene left foot (cm)     FINAL MICROSCOPIC DIAGNOSIS:  A. LEG, LEFT BELOW KNEE, AMPUTATION: Ulceration with acute inflammation and necrosis. Severe calcified atherosclerosis.   GROSS DESCRIPTION:  Specimen: Received fresh is a received fresh is a clinically left below the knee amputation with 4 digits present  (the fifth digit is notably absent) Measurements: The leg measures 28 cm in length, the foot measures 23 cm in length, the tibia and fibula extend 1 cm from the soft tissue margin Soft Tissue Margin: Grossly viable Vasculature: The anterior and posterior tibial arteries are identified and followed revealing ample amounts of atherosclerosis within both arteries. Lesions: There is a large area of gray-black ulceration  involving the dorsal and plantar surface of the foot as well as the second, third, and fourth digits.  Sectioning the  fourth digit reveals minimally softened underlying bone Representative sections are submitted as follows: A1 soft tissue margin A2 anterior tibial artery margin A3 mid and distal anterior tibial artery A4 posterior tibial artery margin A5 mid and distal posterior tibial artery A6 area of ulceration and bone underlying fourth digit, following decal (KW, 07/16/2023)  Final Diagnosis performed by Jimmy Picket, MD.   Electronically signed 07/17/2023 Technical and / or Professional components performed at [employer]. [facility], [address] [place], [place] [address].  Immunohistochemistry Technical component (if applicable) was performed at [facility]. [address], [place], [place] [address].   IMMUNOHISTOCHEMISTRY DISCLAIMER (if applicable): Some of these immunohistochemical stains may have been developed and the performance characteristics determine by [facility]. Some may not have been cleared or approved by the  U.S. Food and Drug Administration. The FDA has determined that such clearance or approval is not necessary. This test is used for clinical purposes. It should not be regarded as investigational or for research. This laboratory is certified under the Clinical Laboratory Improvement Amendments of 1988 (CLIA-88) as qualified to perform high complexity clinical laboratory testing.  The controls  stained appropriately.   IHC stains are performed on formalin fixed, paraffin embedded tissue using a 3,3"diaminobenzidine (DAB) chromogen and Leica Bond Autostainer System. The staining intensity of the nucleus is score manually and is reported as the percentage of tumor cell nuclei demonstrating specific nuclear staining. The specimens are fixed in 10% Neutral Formalin for at least 6 hours and up to 72hrs. These tests are validated on decalcified tissue. Results should be interpreted with caution given the possibility of false negative results on decalcified specimens. Antibody Clon es are as follows ER-clone 42F, PR-clone 16, Ki67- clone MM1. Some of these immunohistochemical stains may have been developed and the performance characteristics determined by [facility].     Discharge Medications:   Allergies as of 07/23/2023   No Known Allergies      Medication List     STOP taking these medications    HYDROcodone-acetaminophen 5-325 MG tablet Commonly known as: NORCO/VICODIN       TAKE these medications    acetaminophen 500 MG tablet Commonly known as: TYLENOL Take 1,000 mg by mouth every 8 (eight) hours as needed (pain.).   Aspirin Low Dose 81 MG tablet Generic drug: aspirin EC Take 1 tablet (81 mg total) by mouth daily. Swallow whole.   atorvastatin 80 MG tablet Commonly known as: LIPITOR Tome 1 tableta (80 mg en total) por va oral diariamente. (Take 1 tablet (80 mg total) by mouth daily.)   clopidogrel 75 MG tablet Commonly known as: Plavix Tome 1 tableta (75 mg en total) por va oral diariamente. (Take 1 tablet (75 mg total) by mouth daily.)   HumaLOG 100 UNIT/ML injection Generic drug: insulin lispro Inject 0.1 mLs (10 Units total) into the skin 3 (three) times daily before meals. For blood sugars 0-150: 0 units, 151-200: 2 units, 201-250: 4 units, 251-300 6 units, 301-350 8 units, 351-400 10 units,> 400:12 units & call M.D. What changed:  how  much to take when to take this reasons to take this additional instructions   hydrochlorothiazide 25 MG tablet Commonly known as: HYDRODIURIL Tome 1 tableta (25 mg en total) por va oral diariamente. (Take 1 tablet (25 mg total) by mouth daily.) What  changed: when to take this   Jardiance 25 MG Tabs tablet Generic drug: empagliflozin Tome 1 tableta (25 mg en total) por va oral diariamente antes de desayunar. (Take 1 tablet (25 mg total) by mouth daily before breakfast.)   lisinopril 20 MG tablet Commonly known as: ZESTRIL Tome 1 tableta (20 mg en total) por va oral diariamente. (Take 1 tablet (20 mg total) by mouth daily.) What changed: when to take this   metFORMIN 500 MG 24 hr tablet Commonly known as: GLUCOPHAGE-XR Take 1 tablet (500 mg total) by mouth 2 (two) times daily before a meal.   nitroGLYCERIN 0.2 mg/hr patch Commonly known as: NITRODUR - Dosed in mg/24 hr Place 1 patch (0.2 mg total) onto the skin daily.   sodium chloride 0.65 % Soln nasal spray Commonly known as: OCEAN Place 1 spray into both nostrils as needed for congestion.   TechLite Plus Pen Needles 32G X 4 MM Misc Generic drug: Insulin Pen Needle use up to four times daily as directed   Trulicity 0.75 MG/0.5ML Sopn Generic drug: Dulaglutide Inject 0.75 mg into the skin once a week.        Diagnostic Studies: PERIPHERAL VASCULAR CATHETERIZATION  Result Date: 07/16/2023 See surgical note for result.   Disposition: Discharge disposition: 02-Transferred to [facility]       Discharge Instructions     Call MD / Call 911   Complete by: As directed    If you experience chest pain or shortness of breath, CALL 911 and be transported to the hospital emergency room.  If you develope a fever above 101 F, pus (white drainage) or increased drainage or redness at the wound, or calf pain, call your surgeon's office.   Constipation Prevention   Complete by: As directed    Drink plenty of  fluids.  Prune juice may be helpful.  You may use a stool softener, such as Colace (over the counter) 100 mg twice a day.  Use MiraLax (over the counter) for constipation as needed.   Diet - low sodium heart healthy   Complete by: As directed    Increase activity slowly as tolerated   Complete by: As directed    Negative Pressure Wound Therapy - Incisional   Complete by: As directed    Attached the wound VAC dressing to the Prevena plus portable wound VAC pump for discharge to inpatient rehab.   Post-operative opioid taper instructions:   Complete by: As directed    POST-OPERATIVE OPIOID TAPER INSTRUCTIONS: It is important to wean off of your opioid medication as soon as possible. If you do not need pain medication after your surgery it is ok to stop day one. Opioids include: Codeine, Hydrocodone(Norco, Vicodin), Oxycodone(Percocet, oxycontin) and hydromorphone amongst others.  Long term and even short term use of opiods can cause: Increased pain response Dependence Constipation Depression Respiratory depression And more.  Withdrawal symptoms can include Flu like symptoms Nausea, vomiting And more Techniques to manage these symptoms Hydrate well Eat regular healthy meals Stay active Use relaxation techniques(deep breathing, meditating, yoga) Do Not substitute Alcohol to help with tapering If you have been on opioids for less than two weeks and do not have pain than it is ok to stop all together.  Plan to wean off of opioids This plan should start within one week post op of your joint replacement. Maintain the same interval or time between taking each dose and first decrease the dose.  Cut the total daily intake of  opioids by one tablet each day Next start to increase the time between doses. The last dose that should be eliminated is the evening dose.           Follow-up Information     Nadara Mustard, MD Follow up in 1 week(s).   Specialty: Orthopedic Surgery Contact  information: [address] [place] [address] [phone]                  Signed: Nadara Mustard 07/23/2023, 11:23 AM

## 2023-07-23 NOTE — H&P (Signed)
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 Lajoyce Corners. Post op, pain control remains and issue as well as decreased po intake due to edentulous state. Labs done at admission reveal hyponatremia with ongoing anemia- hgb 10.4. PT/OT has been working with patient who requires min assist  He was independent without AD prior to toe amputation a couple of months ago. CIR recommended due to functional decline. Complains of weakness.    Review of Systems  Constitutional:  Positive for weight loss.  Eyes:  Positive for blurred vision (Has poor vision--uses large print. Never had formal eye exam?).  Respiratory:  Negative for cough and shortness of breath.   Cardiovascular:  Negative for chest pain and leg swelling.  Gastrointestinal:  Positive for constipation. Negative for abdominal pain.  Musculoskeletal:  Positive for falls (due to poor vision/numb feet?). Negative for myalgias.  Neurological:  Positive for sensory change (right foot feels "asleep") and weakness.    Past Medical History:  Diagnosis Date   Diabetes mellitus without complication (HCC)    History of alcohol abuse    quit in 2015   Hypertension    Peripheral vascular disease [facility])     Past Surgical History:  Procedure Laterality Date   ABDOMINAL AORTOGRAM W/LOWER EXTREMITY Left 05/16/2023   Procedure: ABDOMINAL AORTOGRAM W/LOWER EXTREMITY;  Surgeon: Leonie Douglas, MD;  Location: [facility];  Service: Cardiovascular;  Laterality: Left;   AMPUTATION Left 07/04/2023   Procedure: LEFT 4TH TOE AMPUTATION;  Surgeon: Nadara Mustard, MD;  Location: [facility] OR;  Service: Orthopedics;  Laterality: Left;   AMPUTATION Left  07/16/2023   Procedure: LEFT BELOW KNEE AMPUTATION;  Surgeon: Nadara Mustard, MD;  Location: [facility] OR;  Service: Orthopedics;  Laterality: Left;   APPLICATION OF WOUND VAC Left 07/16/2023   Procedure: APPLICATION OF WOUND VAC;  Surgeon: Nadara Mustard, MD;  Location: [facility] OR;  Service: Orthopedics;  Laterality: Left;   I & D EXTREMITY Left 10/24/2021   Procedure: IRRIGATION AND DEBRIDEMENT OF KNEE AND EXCISION PRE-PATELLA BURSA;  Surgeon: Ernestina Columbia, MD;  Location: [facility] OR;  Service: Orthopedics;  Laterality: Left;   PERIPHERAL VASCULAR BALLOON ANGIOPLASTY Left 05/16/2023   Procedure: PERIPHERAL VASCULAR BALLOON ANGIOPLASTY;  Surgeon: Leonie Douglas, MD;  Location: [facility];  Service: Cardiovascular;  Laterality: Left;  left AT and left peroneal    Family History  Problem Relation Age of Onset   Diabetes Sister    Cancer Sister    Diabetes Brother     Social History:  Married. Lives with wife and daughter. Retired at age 84 due to health issues--used to work in construction/laid pipes. Per reports that he has never smoked. He does not have any smokeless tobacco history on file. He reports that he does not currently use alcohol--history of alcohol abuse and quit in 2015. He reports that he does not use drugs.   Allergies: No Known Allergies   Medications Prior to Admission  Medication Sig Dispense Refill   acetaminophen (TYLENOL) 500 MG tablet Take 1,000 mg by mouth every 8 (eight) hours as needed (pain.).     aspirin EC 81 MG  tablet Take 1 tablet (81 mg total) by mouth daily. Swallow whole. 120 tablet 2   atorvastatin (LIPITOR) 80 MG tablet Take 1 tablet (80 mg total) by mouth daily. (Patient not taking: Reported on 07/02/2023) 90 tablet 1   clopidogrel (PLAVIX) 75 MG tablet Take 1 tablet (75 mg total) by mouth daily. 30 tablet 11   Dulaglutide (TRULICITY) 0.75 MG/0.5ML SOPN Inject 0.75 mg into the skin once a week. 2 mL 4   empagliflozin (JARDIANCE) 25 MG TABS tablet Take 1 tablet  (25 mg total) by mouth daily before breakfast. 90 tablet 1   hydrochlorothiazide (HYDRODIURIL) 25 MG tablet Take 1 tablet (25 mg total) by mouth daily. (Patient taking differently: Take 25 mg by mouth every evening.) 90 tablet 3   insulin lispro (HUMALOG) 100 UNIT/ML injection Inject 0.1 mLs (10 Units total) into the skin 3 (three) times daily before meals. For blood sugars 0-150: 0 units, 151-200: 2 units, 201-250: 4 units, 251-300 6 units, 301-350 8 units, 351-400 10 units,> 400:12 units & call M.D. (Patient taking differently: Inject 0-12 Units into the skin 3 (three) times daily as needed for high blood sugar. Sliding scale insulin) 10 mL 11   Insulin Pen Needle (TECHLITE PEN NEEDLES) 32G X 4 MM MISC use up to four times daily as directed 100 each 6   lisinopril (ZESTRIL) 20 MG tablet Take 1 tablet (20 mg total) by mouth daily. (Patient taking differently: Take 20 mg by mouth every evening.) 90 tablet 3   metFORMIN (GLUCOPHAGE-XR) 500 MG 24 hr tablet Take 1 tablet (500 mg total) by mouth 2 (two) times daily before a meal. 90 tablet 1   nitroGLYCERIN (NITRODUR - DOSED IN MG/24 HR) 0.2 mg/hr patch Place 1 patch (0.2 mg total) onto the skin daily. 30 patch 12   sodium chloride (OCEAN) 0.65 % SOLN nasal spray Place 1 spray into both nostrils as needed for congestion.        Home: Home Living Family/patient expects to be discharged to:: Private residence Living Arrangements: Spouse/significant other, Children   Functional History:    Functional Status:  Mobility:          ADL:    Cognition: Cognition Orientation Level: Oriented X4    Physical Exam: Blood pressure (!) 144/77, pulse 80, temperature 97.8 F (36.6 C), temperature source Oral, resp. rate 17, height 5\' 1"  (1.549 m), SpO2 99%.   Results for orders placed or performed during the hospital encounter of 07/23/23 (from the past 48 hour(s))  Glucose, capillary     Status: Abnormal   Collection Time: 07/23/23  5:15 PM   Result Value Ref Range   Glucose-Capillary 108 (H) 70 - 99 mg/dL    Comment: Glucose reference range applies only to samples taken after fasting for at least 8 hours.   No results found. Gen: no distress, normal appearing HEENT: oral mucosa pink and moist, NCAT Cardio: Reg rate Chest: normal effort, normal rate of breathing Abd: soft, non-distended Ext: no edema Psych: pleasant, normal affect Musculoskeletal:     Comments: Left BKA with wound VAC in place  Skin:    General: Skin is warm and dry.  Neurological:     Mental Status: He is alert.     Comments: Makes eye contact and interacts appropriately but looked to daughter to answer most questions. He was aware that he's in hospital in [place] but unable to state name. Unable to recall month or time of year "getting close to winter" he is  able to follow simple motor commands without difficulty.     Blood pressure (!) 144/77, pulse 80, temperature 97.8 F (36.6 C), temperature source Oral, resp. rate 17, height 5\' 1"  (1.549 m), SpO2 99%.  Medical Problem List and Plan: 1. Functional deficits secondary to L BKA  -patient may not shower  -ELOS/Goals: 7 days Therapeutic grounds pass prescribed, discussed that grounds pass timeframe is 1 hour and can be used during the times of 10:00am and 6:30pm and should no be used during therapy hours. Advised patient to check out and in at nursing station when using grounds pass   2. Impaired mobility: continue lovenox  -antiplatelet therapy: DAPT 3. Pain Management: oxycodone--Oxycontin 10 mg BID added (using oxy 15 TID in past 24 hours). as reports pain nagging and does not ask. Daughter has to advocate for him.   --will add gabapentin 100 mg TID also  -consider mirror therapy 4. Mood/Behavior/Sleep:  LCSW to follow for evaluation and support.   -antipsychotic agents: N/A  --sleeping well except for pain 5. Neuropsych/cognition: This patient may not be fully capable of making decisions on  his own behalf. 6. Skin/Wound Care:  Routine pressure relief measures. Continue wound VAC for 7-10 days.  7. Fluids/Electrolytes/Nutrition: Monitor I/O. Family concerned about his recent wt  loss/poor intake  --ensure max and beneprotein added to promote healing.   --continue Zinc, Juven and Vitamin C  --diet down graded to D2 (per family )due to edentulous state. (They are willing to further modify to puree if D2 still difficult to chew.) 8. T2DM: Monitor BS ac/hs. Continue Metformin 500 mg bid and Jardiance 25 mg. Will ask daughter to bring in Trulicity.  --Continue   novolog 4 units TID meal coverage for now-->may need to d/c if BS drop with increase in activity.  9. Anemia of chronic illness?: Hgb has been trending down since July surgery. Wt loss and poor intake past 3 months. Will order anemia panel.   --Will check post op H/H in am. . 10. HTN: Monitor BP TID--continue Lisinopril and hydrochlorothiazide daily.  11. Constipation: Will add Senna to colace.  --Dose of MOM today as had been straining without results.  12. PAD: On DAPT and Lipitor.   13. Screening for vitamin D deficiency: check vitamin D level tomorrow   I have personally performed a face to face diagnostic evaluation, including, but not limited to relevant history and physical exam findings, of this patient and developed relevant assessment and plan.  Additionally, I have reviewed and concur with the physician assistant's documentation above.  Jacquelynn Cree, PA-C   Horton Chin, MD 07/23/2023

## 2023-07-23 NOTE — Plan of Care (Signed)
Problem: Education: Goal: Knowledge of the prescribed therapeutic regimen will improve Outcome: Progressing Goal: Ability to verbalize activity precautions or restrictions will improve Outcome: Progressing Goal: Understanding of discharge needs will improve Outcome: Progressing   Problem: Activity: Goal: Ability to perform//tolerate increased activity and mobilize with assistive devices will improve Outcome: Progressing   Problem: Self-Care: Goal: Ability to meet self-care needs will improve Outcome: Progressing

## 2023-07-23 NOTE — TOC Transition Note (Signed)
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 Spanish speaking. From home with family ( wife, daughter/Sonya [phone]) and grandchildren). Daughter Elease Hashimoto [phone]) speaks English. PTA independent with ADL's. Pt with cane , RW and crutches @ home. Pt without transportation issues. PCP: [facility]  Per MD patient ready for DC today.  Pt will transition to CIR. RN, patient, patient's family aware of DC plan.    RNCM will sign off for now as intervention is no longer needed. Please consult [place] again if new needs arise.     Final next level of care: IP Rehab Facility Barriers to Discharge: No Barriers Identified   Patient Goals and CMS Choice      Discharge Placement                         Discharge Plan and Services Additional resources added to the After Visit Summary for                                       Social Determinants of Health (SDOH) Interventions SDOH Screenings   Food Insecurity: No Food Insecurity (07/16/2023)  Housing: Patient Declined (07/16/2023)  Transportation Needs: No Transportation Needs (07/16/2023)  Utilities: Not At Risk (07/16/2023)  Depression (PHQ2-9): Low Risk  (05/06/2023)  Tobacco Use: Unknown (07/16/2023)     Readmission Risk Interventions     No data to display

## 2023-07-23 NOTE — Progress Notes (Signed)
Horton Chin, MD  Physician Physical Medicine and Rehabilitation   PMR Pre-admission    Signed   Date of Service: 07/23/2023 11:36 AM  Related encounter: Admission (Current) from 07/16/2023 in [facility]   Signed     Expand All Collapse All  PMR Admission Coordinator Pre-Admission Assessment   Patient: Patrick [name] is an 77 y.o., male MRN: [id number] DOB: 1946-06-30 Height: 5\' 1"  (154.9 cm) Weight: 60 kg   Insurance Information HMO:     PPO:      PCP:      IPA:      80/20:      OTHER:  PRIMARY: uninsured      Policy#:       Subscriber:  CM Name:       Phone#:      Fax#:  Pre-Cert#:       Employer:  Benefits:  Phone #:      Name:  Eff. Date:      Deduct:       Out of Pocket Max:       Life Max:  CIR:       SNF:  Outpatient:      Co-Pay:  Home Health:       Co-Pay:  DME:      Co-Pay:  Providers:  SECONDARY:       Policy#:      Phone#:    Artist:       Phone#:    The Data processing manager" for patients in Inpatient Rehabilitation Facilities with attached "Privacy Act Statement-Health Care Records" was provided and verbally reviewed with: Patient and Family   Emergency Contact Information Contact Information       Name Relation Home Work [place] Daughter     [phone]    [place] Daughter     [phone]         Other Contacts       Name Relation Home Work Mobile    Oros,Omar Brother [phone]        [place], Destefano     [phone]    Thurmond Butts     [phone]           Current Medical History  Patient Admitting Diagnosis: BKA L   History of Present Illness: Pt is a 77 y/o male with PMH of DM, HTN, PVD admitted to [facility] on 07/16/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  Previous admit for L toe amputation on 9/6.  Post op course pain management, wound vac to LLE residual limb. Therapy evaluations completed and pt was recommended for CIR>    Patient's  medical record from [facility] has been reviewed by the rehabilitation admission coordinator and physician.   Past Medical History      Past Medical History:  Diagnosis Date   Diabetes mellitus without complication (HCC)     Hypertension     Peripheral vascular disease (HCC)            Has the patient had major surgery during 100 days prior to admission? Yes   Family History   Family history is unknown by patient.   Current Medications  Current Medications    Current Facility-Administered Medications:    0.9 %  sodium chloride infusion, , Intravenous, Continuous, Nadara Mustard, MD, Last Rate: 75 mL/hr at 07/16/23 1343, New Bag at 07/16/23 1343   acetaminophen (TYLENOL) tablet 325-650 mg, 325-650 mg, Oral, Q6H  PRN, Nadara Mustard, MD, 650 mg at 07/22/23 2027   alum & mag hydroxide-simeth (MAALOX/MYLANTA) 200-200-20 MG/5ML suspension 15-30 mL, 15-30 mL, Oral, Q2H PRN, Nadara Mustard, MD   ascorbic acid (VITAMIN C) tablet 1,000 mg, 1,000 mg, Oral, Daily, Nadara Mustard, MD, 1,000 mg at 07/23/23 0820   aspirin EC tablet 81 mg, 81 mg, Oral, Daily, Nadara Mustard, MD, 81 mg at 07/23/23 0820   atorvastatin (LIPITOR) tablet 80 mg, 80 mg, Oral, Daily, Nadara Mustard, MD, 80 mg at 07/23/23 8119   bisacodyl (DULCOLAX) EC tablet 5 mg, 5 mg, Oral, Daily PRN, Nadara Mustard, MD, 5 mg at 07/20/23 0805   clopidogrel (PLAVIX) tablet 75 mg, 75 mg, Oral, Daily, Nadara Mustard, MD, 75 mg at 07/23/23 1478   docusate sodium (COLACE) capsule 100 mg, 100 mg, Oral, Daily, Nadara Mustard, MD, 100 mg at 07/23/23 2956   empagliflozin (JARDIANCE) tablet 25 mg, 25 mg, Oral, QAC breakfast, Nadara Mustard, MD, 25 mg at 07/23/23 0908   guaiFENesin-dextromethorphan (ROBITUSSIN DM) 100-10 MG/5ML syrup 15 mL, 15 mL, Oral, Q4H PRN, Nadara Mustard, MD   hydrALAZINE (APRESOLINE) injection 5 mg, 5 mg, Intravenous, Q20 Min PRN, Nadara Mustard, MD, 5 mg at 07/16/23 1728   hydrochlorothiazide (HYDRODIURIL) tablet 25 mg, 25  mg, Oral, Daily, Nadara Mustard, MD, 25 mg at 07/23/23 0820   HYDROmorphone (DILAUDID) injection 0.5-1 mg, 0.5-1 mg, Intravenous, Q4H PRN, Nadara Mustard, MD   insulin aspart (novoLOG) injection 0-15 Units, 0-15 Units, Subcutaneous, TID WC, Nadara Mustard, MD, 5 Units at 07/23/23 0906   insulin aspart (novoLOG) injection 4 Units, 4 Units, Subcutaneous, TID WC, Nadara Mustard, MD, 4 Units at 07/23/23 2130   labetalol (NORMODYNE) injection 10 mg, 10 mg, Intravenous, Q10 min PRN, Nadara Mustard, MD   lisinopril (ZESTRIL) tablet 20 mg, 20 mg, Oral, Daily, Nadara Mustard, MD, 20 mg at 07/23/23 8657   magnesium citrate solution 1 Bottle, 1 Bottle, Oral, Once PRN, Nadara Mustard, MD   magnesium sulfate IVPB 2 g 50 mL, 2 g, Intravenous, Daily PRN, Nadara Mustard, MD   metFORMIN (GLUCOPHAGE-XR) 24 hr tablet 500 mg, 500 mg, Oral, BID AC, Nadara Mustard, MD, 500 mg at 07/23/23 8469   metoprolol tartrate (LOPRESSOR) injection 2-5 mg, 2-5 mg, Intravenous, Q2H PRN, Nadara Mustard, MD   nutrition supplement (JUVEN) (JUVEN) powder packet 1 packet, 1 packet, Oral, BID BM, Nadara Mustard, MD, 1 packet at 07/23/23 0819   ondansetron [facility] injection 4 mg, 4 mg, Intravenous, Q6H PRN, Nadara Mustard, MD   Oral care mouth rinse, 15 mL, Mouth Rinse, PRN, Nadara Mustard, MD   oxyCODONE (Oxy IR/ROXICODONE) immediate release tablet 10-15 mg, 10-15 mg, Oral, Q4H PRN, Nadara Mustard, MD, 15 mg at 07/23/23 6295   oxyCODONE (Oxy IR/ROXICODONE) immediate release tablet 5-10 mg, 5-10 mg, Oral, Q4H PRN, Nadara Mustard, MD, 10 mg at 07/21/23 1348   pantoprazole (PROTONIX) EC tablet 40 mg, 40 mg, Oral, Daily, Nadara Mustard, MD, 40 mg at 07/23/23 0819   phenol (CHLORASEPTIC) mouth spray 1 spray, 1 spray, Mouth/Throat, PRN, Nadara Mustard, MD   polyethylene glycol (MIRALAX / GLYCOLAX) packet 17 g, 17 g, Oral, Daily PRN, Nadara Mustard, MD, 17 g at 07/18/23 0827   potassium chloride SA (KLOR-CON M) CR tablet 20-40 mEq, 20-40 mEq,  Oral, Daily PRN, Nadara Mustard, MD   zinc sulfate capsule  220 mg, 220 mg, Oral, Daily, Nadara Mustard, MD, 220 mg at 07/23/23 0820     Patients Current Diet:  Diet Order                  Diet - low sodium heart healthy             Diet Carb Modified Fluid consistency: Thin; Room service appropriate? Yes  Diet effective now                         Precautions / Restrictions Precautions Precautions: Fall Precaution Comments: wound vac L residual limb Restrictions Weight Bearing Restrictions: Yes LLE Weight Bearing: Non weight bearing    Has the patient had 2 or more falls or a fall with injury in the past year? No   Prior Activity Level Limited Community (1-2x/wk): the last few months he has been limited by pain, using a RW, and requiring assist for ADLs   Prior Functional Level Self Care: Did the patient need help bathing, dressing, using the toilet or eating? Independent   Indoor Mobility: Did the patient need assistance with walking from room to room (with or without device)? Independent   Stairs: Did the patient need assistance with internal or external stairs (with or without device)? Independent   Functional Cognition: Did the patient need help planning regular tasks such as shopping or remembering to take medications? Independent   Patient Information Are you of Hispanic, Latino/a,or Spanish origin?: B. Yes, [place] American, Chicano/a What is your race?: A. White Do you need or want an interpreter to communicate with a doctor or health care staff?: 1. Yes   Patient's Response To:  Health Literacy and Transportation Is the patient able to respond to health literacy and transportation needs?: Yes Health Literacy - How often do you need to have someone help you when you read instructions, pamphlets, or other written material from your doctor or pharmacy?: Always In the past 12 months, has lack of transportation kept you from medical appointments or from  getting medications?: No In the past 12 months, has lack of transportation kept you from meetings, work, or from getting things needed for daily living?: No   Home Assistive Devices / Equipment Home Assistive Devices/Equipment: Environmental consultant (specify type), Crutches, CBG Meter (urinal) Home Equipment: Rolling Walker (2 wheels), Crutches   Prior Device Use: Indicate devices/aids used by the patient prior to current illness, exacerbation or injury? Walker   Current Functional Level Cognition   Overall Cognitive Status: Within Functional Limits for tasks assessed Difficult to assess due to: Non-English speaking Orientation Level: Oriented X4 Safety/Judgement: Decreased awareness of safety General Comments: cues for safe hand placement - but overall wfl    Extremity Assessment (includes Sensation/Coordination)   Upper Extremity Assessment: Overall WFL for tasks assessed  Lower Extremity Assessment: Defer to PT evaluation LLE Deficits / Details: s/p BKA 07/16/23 with wound vac attached; WFL AROM     ADLs   Overall ADL's : Needs assistance/impaired Eating/Feeding: Independent, Sitting Grooming: Set up, Sitting Upper Body Bathing: Supervision/ safety, Sitting Lower Body Bathing: Moderate assistance, Sitting/lateral leans Upper Body Dressing : Supervision/safety, Sitting Lower Body Dressing: Minimal assistance, Sitting/lateral leans Lower Body Dressing Details (indicate cue type and reason): donning sock Toilet Transfer: Minimal assistance, Cueing for safety, Ambulation, Rolling walker (2 wheels) Toilet Transfer Details (indicate cue type and reason): into bathroom and sitting on toilet Toileting- Clothing Manipulation and Hygiene: Minimal assistance, Sitting/lateral lean Functional  mobility during ADLs: Minimal assistance, Rolling walker (2 wheels) General ADL Comments: Educated pt and daughter in completing bathing and dressing in sitting, leaning side to side, benefits of 3 in 1 and tub  transfer bench.     Mobility   Overal bed mobility: Needs Assistance Bed Mobility: Sit to Supine Supine to sit: Min assist, HOB elevated Sit to supine: Contact guard assist General bed mobility comments: line management assist     Transfers   Overall transfer level: Needs assistance Equipment used: Rolling walker (2 wheels) Transfers: Sit to/from Stand Sit to Stand: Contact guard assist, Min assist, From elevated surface General transfer comment: cues for safe hand placement and assist for balance     Ambulation / Gait / Stairs / Wheelchair Mobility   Ambulation/Gait Ambulation/Gait assistance: Min assist, Mod assist Gait Distance (Feet): 25 Feet (including up and down small incline in and out of bathroom) Assistive device: Rolling walker (2 wheels) Gait Pattern/deviations:  (Hop-to gait) General Gait Details: pt with short hop-to gait, minmal foot clearance. PT cues to ensure increased proximity to chair/bed prior to initiating stand to sit Gait velocity: reduced Gait velocity interpretation: <1.31 ft/sec, indicative of household ambulator     Posture / Balance Balance Overall balance assessment: Needs assistance Sitting-balance support: No upper extremity supported, Feet supported Sitting balance-Leahy Scale: Good Standing balance support: Bilateral upper extremity supported, Reliant on assistive device for balance Standing balance-Leahy Scale: Poor Standing balance comment: Reliant on RW and min assist     Special needs/care consideration Wound Vac yes LLE, Skin L BKA incision, and Diabetic management yes    Previous Home Environment (from acute therapy documentation) Living Arrangements: Spouse/significant other, Children (daughter) Available Help at Discharge: Family, Available 24 hours/day Type of Home: House Home Layout: One level Home Access: Stairs to enter Entrance Stairs-Rails: None Entrance Stairs-Number of Steps: 5 Bathroom Shower/Tub: Teacher, music: Standard Bathroom Accessibility: Yes (to RW) Home Care Services: No   Discharge Living Setting Plans for Discharge Living Setting: Patient's home, Lives with (comment) (spouse and daughters) Type of Home at Discharge: House Discharge Home Layout: One level Discharge Home Access: Stairs to enter Entrance Stairs-Rails: None Entrance Stairs-Number of Steps: 5 Discharge Bathroom Shower/Tub: Tub/shower unit Discharge Bathroom Toilet: Standard Discharge Bathroom Accessibility: Yes How Accessible: Accessible via walker Does the patient have any problems obtaining your medications?: Yes (Describe) (uninsured)   Social/Family/Support Systems Patient Roles: Spouse Anticipated Caregiver: daughter, Byrd Hesselbach, is primary contact Anticipated Caregiver's Contact Information: [phone] Ability/Limitations of Caregiver: none stated, though pt hopes he will not require any assist at discharge Caregiver Availability: 24/7 Discharge Plan Discussed with Primary Caregiver: Yes Is Caregiver In Agreement with Plan?: Yes Does Caregiver/Family have Issues with Lodging/Transportation while Pt is in Rehab?: No   Goals Patient/Family Goal for Rehab: PT/OT mod I w/c level, supervision ambulation Expected length of stay: 10-12 days Cultural Considerations: needs spanish interpreter Additional Information: DIscharge plan: home to pt's home with family providing 24/7 supervision if needed Pt/Family Agrees to Admission and willing to participate: Yes Program Orientation Provided & Reviewed with Pt/Caregiver Including Roles  & Responsibilities: Yes  Barriers to Discharge: Insurance for SNF coverage, Home environment access/layout   Decrease burden of Care through IP rehab admission: n/a   Possible need for SNF placement upon discharge: Not anticipated.  Pt with good family support and expected to reach mod I w/c level.  Home with spouse and children.    Patient Condition: I have reviewed  medical records from  [facility], spoken with CM, and patient and daughter. I met with patient at the bedside for inpatient rehabilitation assessment.  Patient will benefit from ongoing PT and OT, can actively participate in 3 hours of therapy a day 5 days of the week, and can make measurable gains during the admission.  Patient will also benefit from the coordinated team approach during an Inpatient Acute Rehabilitation admission.  The patient will receive intensive therapy as well as Rehabilitation physician, nursing, social worker, and care management interventions.  Due to safety, skin/wound care, disease management, medication administration, pain management, and patient education the patient requires 24 hour a day rehabilitation nursing.  The patient is currently min assist with mobility and basic ADLs.  Discharge setting and therapy post discharge at home with outpatient is anticipated.  Patient has agreed to participate in the Acute Inpatient Rehabilitation Program and will admit today.   Preadmission Screen Completed By:  Stephania Fragmin, PT, DPT 07/23/2023 11:37 AM ______________________________________________________________________   Discussed status with Dr. Carlis Abbott  on 07/23/23 . at now  and received approval for admission today.   Admission Coordinator:  Stephania Fragmin, PT, DPT time 11:37 AM Dorna Bloom 07/23/23     Assessment/Plan: Diagnosis: L BKA Does the need for close, 24 hr/day Medical supervision in concert with the patient's rehab needs make it unreasonable for this patient to be served in a less intensive setting? Yes Co-Morbidities requiring supervision/potential complications: DM, HTN, PVD, knee cellulitis, history of alcohol intoxication Due to bladder management, bowel management, safety, skin/wound care, disease management, medication administration, pain management, and patient education, does the patient require 24 hr/day rehab nursing? Yes Does the patient require coordinated  care of a physician, rehab nurse, PT, OT, and SLP to address physical and functional deficits in the context of the above medical diagnosis(es)? Yes Addressing deficits in the following areas: balance, endurance, locomotion, strength, transferring, bowel/bladder control, bathing, dressing, feeding, grooming, toileting, and psychosocial support Can the patient actively participate in an intensive therapy program of at least 3 hrs of therapy 5 days a week? Yes The potential for patient to make measurable gains while on inpatient rehab is excellent Anticipated functional outcomes upon discharge from inpatient rehab: supervision PT, supervision OT, supervision SLP Estimated rehab length of stay to reach the above functional goals is: 7 days Anticipated discharge destination: Home 10. Overall Rehab/Functional Prognosis: excellent     MD Signature: Sula Soda, MD         Revision History

## 2023-07-23 NOTE — Progress Notes (Signed)
Inpatient Rehab Admissions Coordinator:   I have a bed for this patient to admit to CIR today.  Dr. Lajoyce Corners in agreement and [facility] aware.  Pt/family aware and I will make arrangements.   Estill Dooms, PT, DPT Admissions Coordinator [phone] 07/23/23  11:36 AM

## 2023-07-23 NOTE — Progress Notes (Signed)
[facility] after giving report to the nurse on duty.

## 2023-07-23 NOTE — Progress Notes (Signed)
Arrived to CIR. Patient accompanied by hospital staff and granddaughter. Patient oriented to unit and assigned room. Admission/ Assessments complete and cosigned by RN.    Tilden Dome, LPN

## 2023-07-23 NOTE — Progress Notes (Signed)
Patient ID: Patrick [name], male   DOB: 1946-03-29, 77 y.o.   MRN: [id number] Plan for discharge to CIR today.  Discharge with the Prevena plus portable wound VAC pump attached to the wound VAC dressing.

## 2023-07-23 NOTE — Plan of Care (Signed)
  Problem: Consults Goal: RH LIMB LOSS PATIENT EDUCATION Description: Description: See Patient Education module for eduction specifics. Outcome: Progressing   Problem: RH BOWEL ELIMINATION Goal: RH STG MANAGE BOWEL WITH ASSISTANCE Description: STG Manage Bowel with mod I Assistance. Outcome: Progressing Goal: RH STG MANAGE BOWEL W/MEDICATION W/ASSISTANCE Description: STG Manage Bowel with Medication with min Assistance. Outcome: Progressing   Problem: RH BLADDER ELIMINATION Goal: RH STG MANAGE BLADDER WITH ASSISTANCE Description: STG Manage Bladder With mod I Assistance Outcome: Progressing   Problem: RH SKIN INTEGRITY Goal: RH STG SKIN FREE OF INFECTION/BREAKDOWN Description: Incision will be free of infection/breakdown with min assist  Outcome: Progressing Goal: RH STG MAINTAIN SKIN INTEGRITY WITH ASSISTANCE Description: STG Maintain Skin Integrity With cueing Assistance. Outcome: Progressing Goal: RH STG ABLE TO PERFORM INCISION/WOUND CARE W/ASSISTANCE Description: STG Able To Perform Incision/Wound Care With min Assistance. Outcome: Progressing   Problem: RH SAFETY Goal: RH STG ADHERE TO SAFETY PRECAUTIONS W/ASSISTANCE/DEVICE Description: STG Adhere to Safety Precautions With cueing Assistance/Device. Outcome: Progressing Goal: RH STG DECREASED RISK OF FALL WITH ASSISTANCE Description: STG Decreased Risk of Fall With min Assistance. Outcome: Progressing   Problem: RH PAIN MANAGEMENT Goal: RH STG PAIN MANAGED AT OR BELOW PT'S PAIN GOAL Description: Less than 4 with PRN medications min assist  Outcome: Progressing   Problem: RH KNOWLEDGE DEFICIT LIMB LOSS Goal: RH STG INCREASE KNOWLEDGE OF SELF CARE AFTER LIMB LOSS Description: Patient/caregiver will be able to manage medications, dressing changes, and diet modifications to improve cholesterol levels and A1C of 6.8 from nursing educations and nursing handouts independently   Outcome: Progressing

## 2023-07-24 DIAGNOSIS — E1152 Type 2 diabetes mellitus with diabetic peripheral angiopathy with gangrene: Secondary | ICD-10-CM

## 2023-07-24 DIAGNOSIS — S88112D Complete traumatic amputation at level between knee and ankle, left lower leg, subsequent encounter: Secondary | ICD-10-CM

## 2023-07-24 DIAGNOSIS — E871 Hypo-osmolality and hyponatremia: Secondary | ICD-10-CM

## 2023-07-24 DIAGNOSIS — M79605 Pain in left leg: Secondary | ICD-10-CM

## 2023-07-24 DIAGNOSIS — I10 Essential (primary) hypertension: Secondary | ICD-10-CM

## 2023-07-24 LAB — COMPREHENSIVE METABOLIC PANEL
ALT: 16 U/L (ref 0–44)
AST: 17 U/L (ref 15–41)
Albumin: 2.9 g/dL — ABNORMAL LOW (ref 3.5–5.0)
Alkaline Phosphatase: 101 U/L (ref 38–126)
Anion gap: 9 (ref 5–15)
BUN: 24 mg/dL — ABNORMAL HIGH (ref 8–23)
CO2: 24 mmol/L (ref 22–32)
Calcium: 8.7 mg/dL — ABNORMAL LOW (ref 8.9–10.3)
Chloride: 99 mmol/L (ref 98–111)
Creatinine, Ser: 0.53 mg/dL — ABNORMAL LOW (ref 0.61–1.24)
GFR, Estimated: >60 mL/min (ref >60)
Glucose, Bld: 107 mg/dL — ABNORMAL HIGH (ref 70–99)
Potassium: 4 mmol/L (ref 3.5–5.1)
Sodium: 132 mmol/L — ABNORMAL LOW (ref 135–145)
Total Bilirubin: 0.8 mg/dL (ref 0.3–1.2)
Total Protein: 6.7 g/dL (ref 6.5–8.1)

## 2023-07-24 LAB — VITAMIN D 25 HYDROXY (VIT D DEFICIENCY, FRACTURES): Vit D, 25-Hydroxy: 38.91 ng/mL (ref 30–100)

## 2023-07-24 LAB — CBC WITH DIFFERENTIAL/PLATELET
Abs Immature Granulocytes: 0.02 10*3/uL (ref 0.00–0.07)
Basophils Absolute: 0 10*3/uL (ref 0.0–0.1)
Basophils Relative: 1 %
Eosinophils Absolute: 0.2 10*3/uL (ref 0.0–0.5)
Eosinophils Relative: 5 %
HCT: 30.5 % — ABNORMAL LOW (ref 39.0–52.0)
Hemoglobin: 9.8 g/dL — ABNORMAL LOW (ref 13.0–17.0)
Immature Granulocytes: 0 %
Lymphocytes Relative: 36 %
Lymphs Abs: 1.8 10*3/uL (ref 0.7–4.0)
MCH: 26.5 pg (ref 26.0–34.0)
MCHC: 32.1 g/dL (ref 30.0–36.0)
MCV: 82.4 fL (ref 80.0–100.0)
Monocytes Absolute: 0.3 10*3/uL (ref 0.1–1.0)
Monocytes Relative: 7 %
Neutro Abs: 2.5 10*3/uL (ref 1.7–7.7)
Neutrophils Relative %: 51 %
Platelets: 376 10*3/uL (ref 150–400)
RBC: 3.7 MIL/uL — ABNORMAL LOW (ref 4.22–5.81)
RDW: 14.7 % (ref 11.5–15.5)
WBC: 4.9 10*3/uL (ref 4.0–10.5)
nRBC: 0 % (ref 0.0–0.2)

## 2023-07-24 LAB — VITAMIN B12: Vitamin B-12: 414 pg/mL (ref 180–914)

## 2023-07-24 LAB — IRON AND TIBC
Iron: 20 ug/dL — ABNORMAL LOW (ref 45–182)
Saturation Ratios: 5 % — ABNORMAL LOW (ref 17.9–39.5)
TIBC: 378 ug/dL (ref 250–450)
UIBC: 358 ug/dL

## 2023-07-24 LAB — FOLATE: Folate: 11.7 ng/mL (ref >5.9)

## 2023-07-24 LAB — RETICULOCYTES
Immature Retic Fract: 14.8 % (ref 2.3–15.9)
RBC.: 3.65 MIL/uL — ABNORMAL LOW (ref 4.22–5.81)
Retic Count, Absolute: 75.2 10*3/uL (ref 19.0–186.0)
Retic Ct Pct: 2.1 % (ref 0.4–3.1)

## 2023-07-24 LAB — MAGNESIUM: Magnesium: 2.3 mg/dL (ref 1.7–2.4)

## 2023-07-24 LAB — GLUCOSE, CAPILLARY
Glucose-Capillary: 108 mg/dL — ABNORMAL HIGH (ref 70–99)
Glucose-Capillary: 182 mg/dL — ABNORMAL HIGH (ref 70–99)
Glucose-Capillary: 134 mg/dL — ABNORMAL HIGH (ref 70–99)
Glucose-Capillary: 204 mg/dL — ABNORMAL HIGH (ref 70–99)

## 2023-07-24 LAB — FERRITIN: Ferritin: 21 ng/mL — ABNORMAL LOW (ref 24–336)

## 2023-07-24 MED ORDER — SORBITOL 70 % SOLN
60.0000 mL | Freq: Once | Status: AC
  Administered 2023-07-24: 60 mL via ORAL
  Filled 2023-07-24: qty 60

## 2023-07-24 NOTE — Progress Notes (Signed)
Notified Patrick [name], [place] of patient's  blood pressure sitting was 67/47. Transferred patient back in bed. Rechecked blood pressure lying: 86/60, temp 99.2. All other vital signs WNL. Patient asymptomatic but states he is just tired from therapy. Per PA give extra fluids and recheck blood pressure after therapy session (bed  level). Hold blood pressure medication on 07/25/23 per Patrick Reining, PA  Rechecked vitals signs,  all vital signs are stable- see flow sheet; notified PA.    Tilden Dome, LPN

## 2023-07-24 NOTE — Plan of Care (Signed)
  Problem: RH Balance Goal: LTG: Patient will maintain dynamic sitting balance (OT) Description: LTG:  Patient will maintain dynamic sitting balance with assistance during activities of daily living (OT) Flowsheets (Taken 07/24/2023 1621) LTG: Pt will maintain dynamic sitting balance during ADLs with: Independent with assistive device Goal: LTG Patient will maintain dynamic standing with ADLs (OT) Description: LTG:  Patient will maintain dynamic standing balance with assist during activities of daily living (OT)  Flowsheets (Taken 07/24/2023 1621) LTG: Pt will maintain dynamic standing balance during ADLs with: Supervision/Verbal cueing   Problem: Sit to Stand Goal: LTG:  Patient will perform sit to stand in prep for activites of daily living with assistance level (OT) Description: LTG:  Patient will perform sit to stand in prep for activites of daily living with assistance level (OT) Flowsheets (Taken 07/24/2023 1621) LTG: PT will perform sit to stand in prep for activites of daily living with assistance level: Supervision/Verbal cueing   Problem: RH Grooming Goal: LTG Patient will perform grooming w/assist,cues/equip (OT) Description: LTG: Patient will perform grooming with assist, with/without cues using equipment (OT) Flowsheets (Taken 07/24/2023 1621) LTG: Pt will perform grooming with assistance level of: Independent with assistive device    Problem: RH Bathing Goal: LTG Patient will bathe all body parts with assist levels (OT) Description: LTG: Patient will bathe all body parts with assist levels (OT) Flowsheets (Taken 07/24/2023 1621) LTG: Pt will perform bathing with assistance level/cueing: Supervision/Verbal cueing   Problem: RH Dressing Goal: LTG Patient will perform upper body dressing (OT) Description: LTG Patient will perform upper body dressing with assist, with/without cues (OT). Flowsheets (Taken 07/24/2023 1621) LTG: Pt will perform upper body dressing with assistance  level of: Independent with assistive device Goal: LTG Patient will perform lower body dressing w/assist (OT) Description: LTG: Patient will perform lower body dressing with assist, with/without cues in positioning using equipment (OT) Flowsheets (Taken 07/24/2023 1621) LTG: Pt will perform lower body dressing with assistance level of: Independent with assistive device   Problem: RH Toileting Goal: LTG Patient will perform toileting task (3/3 steps) with assistance level (OT) Description: LTG: Patient will perform toileting task (3/3 steps) with assistance level (OT)  Flowsheets (Taken 07/24/2023 1621) LTG: Pt will perform toileting task (3/3 steps) with assistance level: Independent with assistive device   Problem: RH Toilet Transfers Goal: LTG Patient will perform toilet transfers w/assist (OT) Description: LTG: Patient will perform toilet transfers with assist, with/without cues using equipment (OT) Flowsheets (Taken 07/24/2023 1621) LTG: Pt will perform toilet transfers with assistance level of: Supervision/Verbal cueing   Problem: RH Tub/Shower Transfers Goal: LTG Patient will perform tub/shower transfers w/assist (OT) Description: LTG: Patient will perform tub/shower transfers with assist, with/without cues using equipment (OT) Flowsheets (Taken 07/24/2023 1621) LTG: Pt will perform tub/shower stall transfers with assistance level of: Supervision/Verbal cueing

## 2023-07-24 NOTE — Plan of Care (Signed)
  Problem: Consults Goal: RH LIMB LOSS PATIENT EDUCATION Description: Description: See Patient Education module for eduction specifics. Outcome: Progressing   Problem: RH BOWEL ELIMINATION Goal: RH STG MANAGE BOWEL WITH ASSISTANCE Description: STG Manage Bowel with mod I Assistance. Outcome: Progressing Goal: RH STG MANAGE BOWEL W/MEDICATION W/ASSISTANCE Description: STG Manage Bowel with Medication with min Assistance. Outcome: Progressing   Problem: RH BLADDER ELIMINATION Goal: RH STG MANAGE BLADDER WITH ASSISTANCE Description: STG Manage Bladder With mod I Assistance Outcome: Progressing   Problem: RH SKIN INTEGRITY Goal: RH STG SKIN FREE OF INFECTION/BREAKDOWN Description: Incision will be free of infection/breakdown with min assist  Outcome: Progressing Goal: RH STG MAINTAIN SKIN INTEGRITY WITH ASSISTANCE Description: STG Maintain Skin Integrity With cueing Assistance. Outcome: Progressing Goal: RH STG ABLE TO PERFORM INCISION/WOUND CARE W/ASSISTANCE Description: STG Able To Perform Incision/Wound Care With min Assistance. Outcome: Progressing   Problem: RH SAFETY Goal: RH STG ADHERE TO SAFETY PRECAUTIONS W/ASSISTANCE/DEVICE Description: STG Adhere to Safety Precautions With cueing Assistance/Device. Outcome: Progressing Goal: RH STG DECREASED RISK OF FALL WITH ASSISTANCE Description: STG Decreased Risk of Fall With min Assistance. Outcome: Progressing   Problem: RH PAIN MANAGEMENT Goal: RH STG PAIN MANAGED AT OR BELOW PT'S PAIN GOAL Description: Less than 4 with PRN medications min assist  Outcome: Progressing   Problem: RH KNOWLEDGE DEFICIT LIMB LOSS Goal: RH STG INCREASE KNOWLEDGE OF SELF CARE AFTER LIMB LOSS Description: Patient/caregiver will be able to manage medications, dressing changes, and diet modifications to improve cholesterol levels and A1C of 6.8 from nursing educations and nursing handouts independently   Outcome: Progressing   Problem:  Education: Goal: Knowledge of the prescribed therapeutic regimen will improve Outcome: Progressing Goal: Ability to verbalize activity precautions or restrictions will improve Outcome: Progressing Goal: Understanding of discharge needs will improve Outcome: Progressing   Problem: Activity: Goal: Ability to perform//tolerate increased activity and mobilize with assistive devices will improve Outcome: Progressing   Problem: Clinical Measurements: Goal: Postoperative complications will be avoided or minimized Outcome: Progressing   Problem: Self-Care: Goal: Ability to meet self-care needs will improve Outcome: Progressing   Problem: Self-Concept: Goal: Ability to maintain and perform role responsibilities to the fullest extent possible will improve Outcome: Progressing   Problem: Pain Management: Goal: Pain level will decrease with appropriate interventions Outcome: Progressing   Problem: Skin Integrity: Goal: Demonstration of wound healing without infection will improve Outcome: Progressing   Problem: Education: Goal: Understanding of CV disease, CV risk reduction, and recovery process will improve Outcome: Progressing Goal: Individualized Educational Video(s) Outcome: Progressing   Problem: Activity: Goal: Ability to return to baseline activity level will improve Outcome: Progressing   Problem: Cardiovascular: Goal: Ability to achieve and maintain adequate cardiovascular perfusion will improve Outcome: Progressing Goal: Vascular access site(s) Level 0-1 will be maintained Outcome: Progressing   Problem: Health Behavior/Discharge Planning: Goal: Ability to safely manage health-related needs after discharge will improve Outcome: Progressing

## 2023-07-24 NOTE — Evaluation (Signed)
Physical Therapy Assessment and Plan  Patient Details  Name: Patrick [name] MRN: [id number] Date of Birth: 05/11/1946  PT Diagnosis: Abnormality of gait, Cognitive deficits, Difficulty walking, Edema, Impaired cognition, and Muscle weakness Rehab Potential: Good ELOS: 7-10 days   Today's Date: 07/24/2023 PT Individual Time: 0850-1000, 7782-4235 PT Individual Time Calculation (min): 70 min, 75 min   Hospital Problem: Principal Problem:   Below-knee amputation of left lower extremity (HCC)   Past Medical History:  Past Medical History:  Diagnosis Date   Diabetes mellitus without complication (HCC)    History of alcohol abuse    quit in 2015   Hypertension    Peripheral vascular disease (HCC)    Past Surgical History:  Past Surgical History:  Procedure Laterality Date   ABDOMINAL AORTOGRAM W/LOWER EXTREMITY Left 05/16/2023   Procedure: ABDOMINAL AORTOGRAM W/LOWER EXTREMITY;  Surgeon: Leonie Douglas, MD;  Location: [facility];  Service: Cardiovascular;  Laterality: Left;   AMPUTATION Left 07/04/2023   Procedure: LEFT 4TH TOE AMPUTATION;  Surgeon: Nadara Mustard, MD;  Location: [facility] OR;  Service: Orthopedics;  Laterality: Left;   AMPUTATION Left 07/16/2023   Procedure: LEFT BELOW KNEE AMPUTATION;  Surgeon: Nadara Mustard, MD;  Location: [facility] OR;  Service: Orthopedics;  Laterality: Left;   APPLICATION OF WOUND VAC Left 07/16/2023   Procedure: APPLICATION OF WOUND VAC;  Surgeon: Nadara Mustard, MD;  Location: [facility] OR;  Service: Orthopedics;  Laterality: Left;   I & D EXTREMITY Left 10/24/2021   Procedure: IRRIGATION AND DEBRIDEMENT OF KNEE AND EXCISION PRE-PATELLA BURSA;  Surgeon: Ernestina Columbia, MD;  Location: [facility] OR;  Service: Orthopedics;  Laterality: Left;   PERIPHERAL VASCULAR BALLOON ANGIOPLASTY Left 05/16/2023   Procedure: PERIPHERAL VASCULAR BALLOON ANGIOPLASTY;  Surgeon: Leonie Douglas, MD;  Location: [facility];  Service: Cardiovascular;  Laterality: Left;  left  AT and left peroneal    Assessment & Plan Clinical Impression: Patient is a 77 y.o. year old male with history of  T2DM, HTN, ischemic left toe ulcers,  PAD s/p angioplasty and toe amputation but continued to have ischemic pain and failure of attempts at limb salvage. 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 Lajoyce Corners. Post op, pain control remains and issue as well as decreased po intake due to edentulous state. Labs done at admission reveal hyponatremia with ongoing anemia- hgb 10.4. PT/OT has been working with patient who requires min assist   He was independent without AD prior to toe amputation a couple of months ago. CIR recommended due to functional decline. Complains of weakness.    Patient currently requires min with mobility secondary to muscle weakness, decreased cardiorespiratoy endurance, decreased awareness and decreased memory, and decreased sitting balance, decreased standing balance, and decreased balance strategies.  Prior to hospitalization, patient was modified independent  with household mobility and lived with Family (lives with sister, mom and nephews) in a House home.  Home access is 5Stairs to enter.  Patient will benefit from skilled PT intervention to maximize safe functional mobility, minimize fall risk, and decrease caregiver burden for planned discharge home with 24 hour supervision.  Anticipate patient will benefit from follow up OP at discharge.  PT - End of Session Activity Tolerance: Tolerates 30+ min activity with multiple rests Endurance Deficit: Yes PT Assessment Rehab Potential (ACUTE/IP ONLY): Good PT Barriers to Discharge: Home environment access/layout;Incontinence;Wound Care;Weight bearing restrictions PT Barriers to Discharge Comments: pain PT Patient demonstrates impairments in the following area(s): Balance;Edema;Endurance;Motor;Pain;Safety;Sensory;Skin Integrity PT  Transfers Functional Problem(s): Bed to Chair;Bed Mobility;Car PT Locomotion  Functional Problem(s): Ambulation;Wheelchair Mobility;Stairs PT Plan PT Intensity: Minimum of 1-2 x/day ,45 to 90 minutes PT Frequency: 5 out of 7 days PT Duration Estimated Length of Stay: 7-10 days PT Treatment/Interventions: Discharge planning;Ambulation/gait training;Functional mobility training;Psychosocial support;Therapeutic Activities;Visual/perceptual remediation/compensation;Balance/vestibular training;Disease management/prevention;Neuromuscular re-education;Skin care/wound management;Therapeutic Exercise;Wheelchair propulsion/positioning;Cognitive remediation/compensation;DME/adaptive equipment instruction;Pain management;Splinting/orthotics;UE/LE Strength taining/ROM;Community reintegration;Functional electrical stimulation;Patient/family education;Stair training;UE/LE Coordination activities PT Transfers Anticipated Outcome(s): mod I PT Locomotion Anticipated Outcome(s): supervision PT Recommendation Follow Up Recommendations: Outpatient PT Patient destination: Home Equipment Recommended: To be determined Equipment Details: pt needs RW, likely shower chair for stair navigation   PT Evaluation Precautions/Restrictions Precautions Precautions: Fall Precaution Comments: wound vac L residual limb, R LE BKA Restrictions Weight Bearing Restrictions: Yes LLE Weight Bearing: Non weight bearing Pain Interference Pain Interference Pain Effect on Sleep: 1. Rarely or not at all Pain Interference with Therapy Activities: 1. Rarely or not at all Pain Interference with Day-to-Day Activities: 1. Rarely or not at all Home Living/Prior Functioning Home Living Available Help at Discharge: Family;Available 24 hours/day Type of Home: House Home Access: Stairs to enter [employer] of Steps: 5 Entrance Stairs-Rails: None (family able to install handrails if needed) Home Layout: One level Bathroom Shower/Tub: Engineer, manufacturing systems: Standard Bathroom Accessibility: No  (bathroom doorway too narrow for RW entry but able to maneuver with RW once inside bathroom) Additional Comments: for past 5 weeks he has been using walker for household ambulation,  Lives With: Family (lives with sister, mom and nephews) Prior Function Level of Independence: Independent with basic ADLs;Requires assistive device for independence;Independent with homemaking with ambulation;Independent with gait;Independent with transfers  Able to Take Stairs?: Yes (with assistance from family) Driving: No Vocation: Retired Optometrist - History Ability to See in Adequate Light: 0 Adequate Perception Perception: Within Functional Limits Praxis Praxis: WFL  Cognition Overall Cognitive Status: History of cognitive impairments - at baseline Arousal/Alertness: Awake/alert Orientation Level: Oriented to person Year: Other (Comment) ("I can't remember") Month: September Day of Week: Incorrect Memory: Impaired Awareness: Impaired Problem Solving: Appears intact Safety/Judgment: Appears intact Sensation Sensation Light Touch: Impaired by gross assessment Additional Comments: pt reports intermittent phantom sensations in R LE Coordination Gross Motor Movements are Fluid and Coordinated: No Fine Motor Movements are Fluid and Coordinated: No Coordination and Movement Description: impaired by R LE BKA Motor  Motor Motor: Other (comment) Motor - Skilled Clinical Observations: R LE BKA   Trunk/Postural Assessment  Cervical Assessment Cervical Assessment: Within Functional Limits Thoracic Assessment Thoracic Assessment: Within Functional Limits Lumbar Assessment Lumbar Assessment: Within Functional Limits Postural Control Postural Control: Deficits on evaluation Righting Reactions: delayed Protective Responses: delayed  Balance Balance Balance Assessed: Yes Static Sitting Balance Static Sitting - Level of Assistance: 5: Stand by assistance Dynamic Sitting  Balance Dynamic Sitting - Level of Assistance: 5: Stand by assistance Static Standing Balance Static Standing - Balance Support: Bilateral upper extremity supported Static Standing - Level of Assistance: 5: Stand by assistance Dynamic Standing Balance Dynamic Standing - Balance Support: Bilateral upper extremity supported Dynamic Standing - Level of Assistance: 4: Min assist Extremity Assessment      RLE Assessment RLE Assessment: Exceptions to [facility] General Strength Comments: grossly 4/5 LLE Assessment LLE Assessment: Exceptions to [facility] General Strength Comments: grossly 3/5  Care Tool Care Tool Bed Mobility Roll left and right activity   Roll left and right assist level: Supervision/Verbal cueing    Sit to lying activity   Sit to lying  assist level: Supervision/Verbal cueing    Lying to sitting on side of bed activity   Lying to sitting on side of bed assist level: the ability to move from lying on the back to sitting on the side of the bed with no back support.: Supervision/Verbal cueing     Care Tool Transfers Sit to stand transfer   Sit to stand assist level: Minimal Assistance - Patient > 75%    Chair/bed transfer   Chair/bed transfer assist level: Minimal Assistance - Patient > 75%     Toilet transfer   Assist Level: Minimal Assistance - Patient > 75%    Car transfer   Car transfer assist level: Minimal Assistance - Patient > 75%      Care Tool Locomotion Ambulation   Assist level: Minimal Assistance - Patient > 75% Assistive device: Walker-rolling Max distance: 22  Walk 10 feet activity   Assist level: Minimal Assistance - Patient > 75% Assistive device: Walker-rolling   Walk 50 feet with 2 turns activity Walk 50 feet with 2 turns activity did not occur: Safety/medical concerns      Walk 150 feet activity Walk 150 feet activity did not occur: Safety/medical concerns      Walk 10 feet on uneven surfaces activity Walk 10 feet on uneven surfaces activity  did not occur: Safety/medical concerns      Stairs Stair activity did not occur: Safety/medical concerns        Walk up/down 1 step activity Walk up/down 1 step or curb (drop down) activity did not occur: Safety/medical concerns      Walk up/down 4 steps activity Walk up/down 4 steps activity did not occur: Safety/medical concerns      Walk up/down 12 steps activity Walk up/down 12 steps activity did not occur: Safety/medical concerns      Pick up small objects from floor   Pick up small object from the floor assist level: Total Assistance - Patient < 25%    Wheelchair Is the patient using a wheelchair?: Yes Type of Wheelchair: Manual   Wheelchair assist level: Minimal Assistance - Patient > 75% Max wheelchair distance: 150+  Wheel 50 feet with 2 turns activity   Assist Level: Minimal Assistance - Patient > 75%  Wheel 150 feet activity   Assist Level: Minimal Assistance - Patient > 75%    Refer to Care Plan for Long Term Goals  SHORT TERM GOAL WEEK 1 PT Short Term Goal 1 (Week 1): STG=LTG 2/2 ELOS  Recommendations for other services: None   Skilled Therapeutic Intervention Mobility Bed Mobility Bed Mobility: Rolling Right;Rolling Left;Supine to Sit;Sit to Supine Rolling Right: Supervision/verbal cueing Rolling Left: Supervision/Verbal cueing Supine to Sit: Supervision/Verbal cueing Sit to Supine: Supervision/Verbal cueing Transfers Transfers: Sit to Stand;Stand to Sit;Stand Pivot Transfers Sit to Stand: Minimal Assistance - Patient > 75% Stand to Sit: Minimal Assistance - Patient > 75% Stand Pivot Transfers: Minimal Assistance - Patient > 75% Stand Pivot Transfer Details: Verbal cues for safe use of DME/AE;Verbal cues for precautions/safety Transfer (Assistive device): Rolling walker Locomotion  Gait Ambulation: Yes Gait Assistance: Minimal Assistance - Patient > 75% Gait Distance (Feet): 22 Feet Assistive device: Rolling walker Gait Assistance Details:  Verbal cues for safe use of DME/AE;Verbal cues for precautions/safety Gait Gait: Yes Gait Pattern: Impaired Gait Pattern:  (hop to gait with decreased R LE clearance) Gait velocity: reduced Stairs / Additional Locomotion Stairs: No Wheelchair Mobility Wheelchair Mobility: Yes Wheelchair Assistance: Minimal assistance - Patient >75% Wheelchair Propulsion: Both  upper extremities Wheelchair Parts Management: Needs assistance   Discharge Criteria: Patient will be discharged from PT if patient refuses treatment 3 consecutive times without medical reason, if treatment goals not met, if there is a change in medical status, if patient makes no progress towards goals or if patient is discharged from hospital.  The above assessment, treatment plan, treatment alternatives and goals were discussed and mutually agreed upon: by patient and by family   Today's Interventions    Treatment Session 1  Pt supine in bed upon arrival. Pt agreeable to therapy. Pt reports 5/10 pain on lateral aspect of distal residual limb with standing, relieved with rest breaks and desensitization techniques. Pt reports intermittent L LE phantom leg pain. Interpretor present at start of session, therapist began using video interpretor, however daughter in room and requesting to interpret.   Evaluation completed (see details above and below) with education on PT POC and goals and individual treatment initiated with focus on education for limb protector.   Pt does not have limb protector in room, notified Scott from [employer]. Pt and therapist very cautious with all mobility as pt did not have limb protector throughout session.   Pt on bed pan upon arrival, with some incontinence on bed sheets. Therpaist utilized time for verifying subjective portion of evaluation, and getting pt 16x16 WC with L LE amputee pad. Pt performed rolling B, while therapist performed pericare and donned/doffed brief with total A for time conservation.    BM: supervision Sit to stand: RW and CGA/min A Stand pivot transfer RW and CGA/min A-verbal cues provided for safety with RW Gait x 22 feet with hop to gait and decreased R LE clearance Therapist opted not to assess stair navigation until limb protector delivered as anticipating need to use shower chair technique 2/2 limited R LE clearance. Education provided to family on need to install either handrails or ramp, will determine after stair navigation assessement. Plan to assess during afternoon session.  WC mobility x150+ feet with B UE support and supervision/min A, verbal cues provided for technique for energy conservation, and technique as pt demos tendency to veer L.   Pt seated in WC at end of session with all needs within reach and seatbelt alarm on.   Treatment Session 2   Pt supine in bed upon arrival. Pt agreeable to therapy. Pt  denies any pain.   Nurse present and reports pt BP low (systolic in 60's and 80s), with directions to keep therapy at bed level, and encouraged fluids.   Pt performed 2x10 B SLR, 2x10 B supine hip abduction, 2x10 single leg bridge, 2x10 prone hip extension, 2x10 sidelying hip abduction with verbal and tactile cues provided for technique and to reduce compensation.   Scott from [employer] delivered limb protector and shrinkers, therapist provided education provided to pt and family on purpose of both as well as wound vac, emphasized importance of wearing limb protector when OOB, and changing cleaning/changing shrinker every other day. Demonstrated donning/doffing shrinker and limb protector. Pt returned demonstration with both x3 with supervision, verbral cues provided for technique.   BP 99/69 HR 97 near end of session, with pt requesting to use bathroom. Encouraged use of bed pan and urinal 2/2 to previously low BP and risk of BM reducing BP, pt refusing. Notified nursing. Nursing cleared pt to do stand pivot to [facility]. Pt performed stand pivot transfer with RW and  min A for urgency to [facility]. Pt continent of bowel. Pt seated on BSC with  tech and nurse in room at end of session.   [facility] [employer], [place], DPT  07/24/2023, 3:52 PM

## 2023-07-24 NOTE — Progress Notes (Signed)
Inpatient Rehabilitation Center Individual Statement of Services  Patient Name:  Patrick [name]  Date:  07/24/2023  Welcome to the Inpatient Rehabilitation Center.  Our goal is to provide you with an individualized program based on your diagnosis and situation, designed to meet your specific needs.  With this comprehensive rehabilitation program, you will be expected to participate in at least 3 hours of rehabilitation therapies Monday-Friday, with modified therapy programming on the weekends.  Your rehabilitation program will include the following services:  Physical Therapy (PT), Occupational Therapy (OT), 24 hour per day rehabilitation nursing, Care Coordinator, Rehabilitation Medicine, Nutrition Services, and Pharmacy Services  Weekly team conferences will be held on Wednesday to discuss your progress.  Your Inpatient Rehabilitation Care Coordinator will talk with you frequently to get your input and to update you on team discussions.  Team conferences with you and your family in attendance may also be held.  Expected length of stay: 7-10 days  Overall anticipated outcome: supervision-independent with device  Depending on your progress and recovery, your program may change. Your Inpatient Rehabilitation Care Coordinator will coordinate services and will keep you informed of any changes. Your Inpatient Rehabilitation Care Coordinator's name and contact numbers are listed  below.  The following services may also be recommended but are not provided by the Inpatient Rehabilitation Center:   Home Health Rehabiltiation Services Outpatient Rehabilitation Services    Arrangements will be made to provide these services after discharge if needed.  Arrangements include referral to agencies that provide these services.  Your insurance has been verified to be:  Uninsured Your primary doctor is:  Gwinda Passe  Pertinent information will be shared with your doctor and your insurance  company.  Inpatient Rehabilitation Care Coordinator:  Dossie Der, [place] [phone] or Luna Glasgow  Information discussed with and copy given to patient by: Lucy Chris, 07/24/2023, 10:50 AM

## 2023-07-24 NOTE — Plan of Care (Signed)
  Problem: RH Balance Goal: LTG Patient will maintain dynamic sitting balance (PT) Description: LTG:  Patient will maintain dynamic sitting balance with assistance during mobility activities (PT) Flowsheets (Taken 07/24/2023 1616) LTG: Pt will maintain dynamic sitting balance during mobility activities with:: Independent with assistive device  Goal: LTG Patient will maintain dynamic standing balance (PT) Description: LTG:  Patient will maintain dynamic standing balance with assistance during mobility activities (PT) Flowsheets (Taken 07/24/2023 1616) LTG: Pt will maintain dynamic standing balance during mobility activities with:: Independent with assistive device    Problem: Sit to Stand Goal: LTG:  Patient will perform sit to stand with assistance level (PT) Description: LTG:  Patient will perform sit to stand with assistance level (PT) Flowsheets (Taken 07/24/2023 1616) LTG: PT will perform sit to stand in preparation for functional mobility with assistance level: Independent with assistive device   Problem: RH Car Transfers Goal: LTG Patient will perform car transfers with assist (PT) Description: LTG: Patient will perform car transfers with assistance (PT). Flowsheets (Taken 07/24/2023 1616) LTG: Pt will perform car transfers with assist:: Supervision/Verbal cueing   Problem: RH Ambulation Goal: LTG Patient will ambulate in controlled environment (PT) Description: LTG: Patient will ambulate in a controlled environment, # of feet with assistance (PT). Flowsheets (Taken 07/24/2023 1616) LTG: Pt will ambulate in controlled environ  assist needed:: Independent with assistive device LTG: Ambulation distance in controlled environment: 50 feet with LRAD Goal: LTG Patient will ambulate in home environment (PT) Description: LTG: Patient will ambulate in home environment, # of feet with assistance (PT). Flowsheets (Taken 07/24/2023 1616) LTG: Pt will ambulate in home environ  assist needed::  Supervision/Verbal cueing LTG: Ambulation distance in home environment: 25 feet with LRAD   Problem: RH Wheelchair Mobility Goal: LTG Patient will propel w/c in controlled environment (PT) Description: LTG: Patient will propel wheelchair in controlled environment, # of feet with assist (PT) Flowsheets (Taken 07/24/2023 1616) LTG: Pt will propel w/c in controlled environ  assist needed:: Independent with assistive device LTG: Propel w/c distance in controlled environment: 150 Goal: LTG Patient will propel w/c in home environment (PT) Description: LTG: Patient will propel wheelchair in home environment, # of feet with assistance (PT). Flowsheets (Taken 07/24/2023 1616) LTG: Pt will propel w/c in home environ  assist needed:: Independent with assistive device LTG: Propel w/c distance in home environment: 75   Problem: RH Stairs Goal: LTG Patient will ambulate up and down stairs w/assist (PT) Description: LTG: Patient will ambulate up and down # of stairs with assistance (PT) Flowsheets (Taken 07/24/2023 1616) LTG: Pt will ambulate up/down stairs assist needed:: Minimal Assistance - Patient > 75% LTG: Pt will  ambulate up and down number of stairs: 5 steps with LRAD

## 2023-07-24 NOTE — Progress Notes (Signed)
Inpatient Rehabilitation Admission Medication Review by a Pharmacist  A complete drug regimen review was completed for this patient to identify any potential clinically significant medication issues.  High Risk Drug Classes Is patient taking? Indication by Medication  Antipsychotic Yes Compazine - prn N/V  Anticoagulant Yes Lovenox - VTE ppx  Antibiotic No   Opioid Yes Oxycodone - pain  Antiplatelet Yes Aspirin, Clopidogrel - PVD  Hypoglycemics/insulin Yes Empagliflozin, insulin, metformin- DM Trulicity - DM (not available)  Vasoactive Medication Yes Hydrochlorothiazide, lisinopril - HTN  Chemotherapy No   Other Yes Atorvastatin - HLD Gabapentin- pain  Pantoprazole - reflux     Type of Medication Issue Identified Description of Issue Recommendation(s)  Drug Interaction(s) (clinically significant)     Duplicate Therapy     Allergy     No Medication Administration End Date     Incorrect Dose     Additional Drug Therapy Needed     Significant med changes from prior encounter (inform family/care partners about these prior to discharge).    Other       Clinically significant medication issues were identified that warrant physician communication and completion of prescribed/recommended actions by midnight of the next day:  No  Name of provider notified for urgent issues identified:   Provider Method of Notification:     Pharmacist comments: None  Time spent performing this drug regimen review (minutes):  20 minutes  Okey Regal, PharmD

## 2023-07-24 NOTE — Progress Notes (Signed)
PROGRESS NOTE   Subjective/Complaints: Patient reports he feels constipated this morning.  He reports he had an enema without good results.  Reports his pain is overall under control.  ROS: Patient denies fever, rash, sore throat, blurred vision, dizziness, nausea, vomiting, diarrhea, cough, shortness of breath or chest pain, joint or back/neck pain, headache, or mood change.   Objective:   No results found. Recent Labs    07/24/23 0654  WBC 4.9  HGB 9.8*  HCT 30.5*  PLT 376   Recent Labs    07/24/23 0654  NA 132*  K 4.0  CL 99  CO2 24  GLUCOSE 107*  BUN 24*  CREATININE 0.53*  CALCIUM 8.7*    Intake/Output Summary (Last 24 hours) at 07/24/2023 0947 Last data filed at 07/24/2023 0900 Gross per 24 hour  Intake 118 ml  Output 0 ml  Net 118 ml        Physical Exam: Vital Signs Blood pressure 118/73, pulse 81, temperature 98.8 F (37.1 C), temperature source Oral, resp. rate 18, height 5\' 1"  (1.549 m), SpO2 100%.   General: No apparent distress HEENT: Head is normocephalic, atraumatic,MMM Neck: Supple without JVD or lymphadenopathy Heart: Reg rate and rhythm. 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 Pt is cooperative Skin: warm and dry Neuro:  Makes eye contact and interacts appropriately but looked to daughter to answer most questions. He was aware that he's in hospital in [place] but unable to state name. Unable to recall month or time of year "getting close to winter" he is able to follow simple motor commands without difficulty.     Assessment/Plan: 1. Functional deficits which require 3+ hours per day of interdisciplinary therapy in a comprehensive inpatient rehab setting. Physiatrist is providing close team supervision and 24 hour management of active  medical problems listed below. Physiatrist and rehab team continue to assess barriers to discharge/monitor patient progress toward functional and medical goals  Care Tool:  Bathing              Bathing assist       Upper Body Dressing/Undressing Upper body dressing        Upper body assist      Lower Body Dressing/Undressing Lower body dressing            Lower body assist       Toileting Toileting    Toileting assist       Transfers Chair/bed transfer  Transfers assist           Locomotion Ambulation   Ambulation assist              Walk 10 feet activity   Assist           Walk 50 feet activity   Assist           Walk 150 feet activity   Assist           Walk 10 feet on uneven surface  activity   Assist           Wheelchair  Assist               Wheelchair 50 feet with 2 turns activity    Assist            Wheelchair 150 feet activity     Assist          Blood pressure 118/73, pulse 81, temperature 98.8 F (37.1 C), temperature source Oral, resp. rate 18, height 5\' 1"  (1.549 m), SpO2 100%.   Medical Problem List and Plan: 1. Functional deficits secondary to L BKA due PAD and Gangrene of left foot              -patient may not shower             -ELOS/Goals: 7 days, supervision PT, supervision OT, supervision SLP   -Continue CIR Therapeutic grounds pass prescribed, discussed that grounds pass timeframe is 1 hour and can be used during the times of 10:00am and 6:30pm and should no be used during therapy hours. Advised patient to check out and in at nursing station when using grounds pass    2. Impaired mobility: continue lovenox             -antiplatelet therapy: DAPT 3. Pain Management: oxycodone--Oxycontin 10 mg BID added (using oxy 15 TID in past 24 hours). as reports pain nagging and does not ask. Daughter has to advocate for him.              --will add gabapentin 100  mg TID also             -consider mirror therapy  -9/26 pain at left residual limb controlled continue current regimen 4. Mood/Behavior/Sleep:  LCSW to follow for evaluation and support.              -antipsychotic agents: N/A             --sleeping well except for pain 5. Neuropsych/cognition: This patient may not be fully capable of making decisions on his own behalf. 6. Skin/Wound Care:  Routine pressure relief measures. Continue wound VAC for 7-10 days.  7. Fluids/Electrolytes/Nutrition: Monitor I/O. Family concerned about his recent wt  loss/poor intake             --ensure max and beneprotein added to promote healing.              --continue Zinc, Juven and Vitamin C             --diet down graded to D2 (per family )due to edentulous state. (They are willing to further modify to puree if D2 still difficult to chew.) 8. T2DM: Monitor BS ac/hs. Continue Metformin 500 mg bid and Jardiance 25 mg. Will ask daughter to bring in Trulicity.  --Continue   novolog 4 units TID meal coverage for now-->may need to d/c if BS drop with increase in activity.  -9/26 Will DC mealtime novolog to avoid hypoglycemia 9. Anemia of chronic illness?: Hgb has been trending down since July surgery. Wt loss and poor intake past 3 months. Will order anemia panel.   --Will check post op H/H in am. . 10. HTN: Monitor BP TID--continue Lisinopril and hydrochlorothiazide daily.   -9/26 BP soft, will discontinue hydrochlorothiazide    07/24/2023    2:41 PM 07/24/2023    2:00 PM 07/24/2023    1:30 PM  Vitals with BMI  Systolic 99 109 86  Diastolic 69 66 60  Pulse 97 94 95    11. Constipation: Will add  Senna to colace.  --Dose of MOM today as had been straining without results.  -9/26 sorbitol 60ml ordered 12. PAD: On DAPT and Lipitor.    13. Screening for vitamin D deficiency: check vitamin D level tomor  14.  Hyponatremia.  Mild continue to monitor    LOS: 1 days A FACE TO FACE EVALUATION WAS  PERFORMED  Fanny Dance 07/24/2023, 9:47 AM

## 2023-07-24 NOTE — Progress Notes (Signed)
Inpatient Rehabilitation  Patient information reviewed and entered into eRehab system by Demarrio Menges Bennett Vanscyoc, OTR/L, Rehab Quality Coordinator.   Information including medical coding, functional ability and quality indicators will be reviewed and updated through discharge.   

## 2023-07-24 NOTE — Progress Notes (Signed)
Inpatient Rehabilitation Care Coordinator Assessment and Plan Patient Details  Name: Patrick [name] MRN: [id number] Date of Birth: [birth year]  Today's Date: 07/24/2023  Hospital Problems: Principal Problem:   Below-knee amputation of left lower extremity [facility])  Past Medical History:  Past Medical History:  Diagnosis Date   Diabetes mellitus without complication (HCC)    History of alcohol abuse    quit in 2015   Hypertension    Peripheral vascular disease [facility])    Past Surgical History:  Past Surgical History:  Procedure Laterality Date   ABDOMINAL AORTOGRAM W/LOWER EXTREMITY Left 05/16/2023   Procedure: ABDOMINAL AORTOGRAM W/LOWER EXTREMITY;  Surgeon: Leonie Douglas, MD;  Location: [facility] INVASIVE CV LAB;  Service: Cardiovascular;  Laterality: Left;   AMPUTATION Left 07/04/2023   Procedure: LEFT 4TH TOE AMPUTATION;  Surgeon: Nadara Mustard, MD;  Location: [facility] OR;  Service: Orthopedics;  Laterality: Left;   AMPUTATION Left 07/16/2023   Procedure: LEFT BELOW KNEE AMPUTATION;  Surgeon: Nadara Mustard, MD;  Location: [facility] OR;  Service: Orthopedics;  Laterality: Left;   APPLICATION OF WOUND VAC Left 07/16/2023   Procedure: APPLICATION OF WOUND VAC;  Surgeon: Nadara Mustard, MD;  Location: [facility] OR;  Service: Orthopedics;  Laterality: Left;   I & D EXTREMITY Left 10/24/2021   Procedure: IRRIGATION AND DEBRIDEMENT OF KNEE AND EXCISION PRE-PATELLA BURSA;  Surgeon: Ernestina Columbia, MD;  Location: [facility] OR;  Service: Orthopedics;  Laterality: Left;   PERIPHERAL VASCULAR BALLOON ANGIOPLASTY Left 05/16/2023   Procedure: PERIPHERAL VASCULAR BALLOON ANGIOPLASTY;  Surgeon: Leonie Douglas, MD;  Location: [facility] INVASIVE CV LAB;  Service: Cardiovascular;  Laterality: Left;  left AT and left peroneal   Social History:  reports that he has never smoked. He does not have any smokeless tobacco history on file. He reports that he does not currently use alcohol. He reports that he does not use drugs.  Family /  Support Systems Marital Status: Married Patient Roles: Spouse, Parent Spouse/Significant Other: Wife Children: Maria-daughter [phone] speaks [place], [place] [phone]-doesn;t speak Lynett Fish [phone] Other Supports: Omar-brother [phone] Anticipated Caregiver: maria Ability/Limitations of Caregiver: Wife is in good health, multiple family members can assist if needed. Caregiver Availability: 24/7 Family Dynamics: Close with family and children pt very independent person and hopes to be mod/i level. 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 Guardian/Conservator: None-according to MD pt is capable of making his own decisions as long as interpreter present, daughter who does speak english and manages his affairs   Abuse/Neglect Abuse/Neglect Assessment Can Be Completed: Yes Physical Abuse: Denies Verbal Abuse: Denies Sexual Abuse: Denies Exploitation of patient/patient's resources: Denies Self-Neglect: Denies  Patient response to: Social Isolation - How often do you feel lonely or isolated from those around you?: Never  Emotional Status Pt's affect, behavior and adjustment status: Pt is motivated to do well and will push himself hard to achieve the goals he has. He does not want to burden any of his family and hopes to be able to take care of himself by discharge Recent Psychosocial Issues: other health  issues-recent hospitalizatio in early 06/2023 Psychiatric  History: No history seems to be coping appropriately and positve regarding his outcome. Substance Abuse History: Past issues with ETOH quit years ago 2015  Patient / Family Perceptions, Expectations & Goals Pt/Family understanding of illness & functional limitations: Pt and daughter can explain his amputation and the reason for this. Daughter talks with the MD involved and reports to whole family and pt the medical issues. Both seem to understand the plan moving forward. 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."  [employer]: Other (Comment) ([facility] follows) Premorbid Home Care/DME Agencies: Other (Comment) (crutches and CBG machine) Transportation available at discharge: family Is the patient able to respond to transportation needs?: Yes In the past 12 months, has lack of transportation kept you from medical appointments or from getting medications?: No In the past 12 months, has lack of transportation kept you from meetings, work, or from getting things needed for daily living?: No  Discharge Planning Living Arrangements: Spouse/significant other, Children Support Systems: Spouse/significant other, Children, Other relatives, Friends/neighbors Type of Residence: Private residence Insurance Resources: Customer service manager Resources: Family Support Financial Screen Referred: Previously completed Living Expenses: Lives with family Money Management: Family Does the patient have any problems obtaining your medications?: No Home Management: family Patient/Family Preliminary Plans: Return home with wife and daughter and her family. Has very good family supports and should do well here due to high  level already. Gave him a youth rw daughter aware of this Care Coordinator Anticipated Follow Up Needs: HH/OP  Clinical Impression Pleasant gentleman who is very motivated to do well and achieve mod/I level prior to going home. His daughter is here who does speak english and at this moment is interpreting for him, since interpreter was not present. Will await team's evaluations and work on discharge needs. Gave them a donated youth rw, informed daughter she could take home if wanted  Lucy Chris 07/24/2023, 10:48 AM

## 2023-07-24 NOTE — Evaluation (Addendum)
Occupational Therapy Assessment and Plan  Patient Details  Name: Patrick [name] MRN: [id number] Date of Birth: [birth year]  OT Diagnosis: abnormal posture, acute pain, cognitive deficits, muscle weakness (generalized), and swelling of limb Rehab Potential: Rehab Potential (ACUTE ONLY): Good ELOS: 7-10 d   Today's Date: 07/24/2023 OT Individual Time: 1045-1200 OT Individual Time Calculation (min): 75 min     Hospital Problem: Principal Problem:   Below-knee amputation of left lower extremity (HCC)   Past Medical History:  Past Medical History:  Diagnosis Date   Diabetes mellitus without complication (HCC)    History of alcohol abuse    quit in 2015   Hypertension    Peripheral vascular disease (HCC)    Past Surgical History:  Past Surgical History:  Procedure Laterality Date   ABDOMINAL AORTOGRAM W/LOWER EXTREMITY Left 05/16/2023   Procedure: ABDOMINAL AORTOGRAM W/LOWER EXTREMITY;  Surgeon: Leonie Douglas, MD;  Location: [facility];  Service: Cardiovascular;  Laterality: Left;   AMPUTATION Left 07/04/2023   Procedure: LEFT 4TH TOE AMPUTATION;  Surgeon: Nadara Mustard, MD;  Location: [facility] OR;  Service: Orthopedics;  Laterality: Left;   AMPUTATION Left 07/16/2023   Procedure: LEFT BELOW KNEE AMPUTATION;  Surgeon: Nadara Mustard, MD;  Location: [facility] OR;  Service: Orthopedics;  Laterality: Left;   APPLICATION OF WOUND VAC Left 07/16/2023   Procedure: APPLICATION OF WOUND VAC;  Surgeon: Nadara Mustard, MD;  Location: [facility] OR;  Service: Orthopedics;  Laterality: Left;   I & D EXTREMITY Left 10/24/2021   Procedure: IRRIGATION AND DEBRIDEMENT OF KNEE AND EXCISION PRE-PATELLA BURSA;  Surgeon: Ernestina Columbia, MD;  Location: [facility] OR;  Service: Orthopedics;  Laterality: Left;   PERIPHERAL VASCULAR BALLOON ANGIOPLASTY Left 05/16/2023   Procedure: PERIPHERAL VASCULAR BALLOON ANGIOPLASTY;  Surgeon: Leonie Douglas, MD;  Location: [facility];  Service: Cardiovascular;  Laterality: Left;   left AT and left peroneal    Assessment & Plan Clinical Impression:   Patient currently requires mod with basic self-care skills and IADL secondary to muscle weakness, decreased cardiorespiratoy endurance, decreased problem solving and decreased memory, and decreased sitting balance, decreased standing balance, decreased postural control, decreased balance strategies, and difficulty maintaining precautions.  Prior to hospitalization, patient could complete BADL's and  with modified independent .  Patient will benefit from skilled intervention to decrease level of assist with basic self-care skills, increase independence with basic self-care skills, and increase level of independence with iADL prior to discharge home with care partner.  Anticipate patient will require 24 hour supervision and minimal physical assistance and follow up home health and follow up outpatient.  OT - End of Session Activity Tolerance: Decreased this session Endurance Deficit: Yes OT Assessment Rehab Potential (ACUTE ONLY): Good OT Barriers to Discharge: Inaccessible home environment;Home environment access/layout;Wound Care OT Barriers to Discharge Comments: new L BKA with STE home OT Patient demonstrates impairments in the following area(s): Balance;Nutrition;Pain;Cognition;Safety;Endurance;Motor;Skin Integrity OT Basic ADL's Functional Problem(s): Grooming;Bathing;Dressing;Toileting OT Advanced ADL's Functional Problem(s): Simple Meal Preparation;Laundry;Light Housekeeping OT Transfers Functional Problem(s): Toilet;Tub/Shower OT Additional Impairment(s): None OT Plan OT Intensity: Minimum of 1-2 x/day, 45 to 90 minutes OT Frequency: 5 out of 7 days OT Duration/Estimated Length of Stay: 7-10 d OT Treatment/Interventions: Balance/vestibular training;Community reintegration;Disease mangement/prevention;Functional electrical stimulation;Neuromuscular re-education;Patient/family education;Self Care/advanced ADL  retraining;Splinting/orthotics;Therapeutic Exercise;UE/LE Coordination activities;Wheelchair propulsion/positioning;Cognitive remediation/compensation;Discharge planning;DME/adaptive equipment instruction;Functional mobility training;Pain management;Psychosocial support;Skin care/wound managment;Therapeutic Activities;UE/LE Strength taining/ROM;Visual/perceptual remediation/compensation OT Self Feeding Anticipated Outcome(s): indep OT Basic Self-Care Anticipated Outcome(s): mod I OT Toileting Anticipated Outcome(s):  mod I OT Bathroom Transfers Anticipated Outcome(s): Supervision OT Recommendation Recommendations for Other Services: Neuropsych consult;Therapeutic Recreation consult Therapeutic Recreation Interventions: Stress management Patient destination: Home Follow Up Recommendations: Home health OT;Outpatient OT Equipment Recommended: Rolling walker with 5" wheels;Tub/shower bench Equipment Details: RW (already brought by MSW), TTB     Individuals Consulted  Consulted and Agree with Results and Recommendations Patient;Family member/caregiver  Family Member Consulted dtrs    OT Evaluation Precautions/Restrictions  Precautions Precautions: Fall Precaution Comments: wound vac L residual limb, R LE BKA Restrictions Weight Bearing Restrictions: Yes LLE Weight Bearing: Non weight bearing Other Position/Activity Restrictions: L limb protector re-ordered as it was lost from acute General Chart Reviewed: Yes Family/Caregiver Present: Yes Vital Signs Therapy Vitals Temp: 97.7 F (36.5 C) Temp Source: Oral Pulse Rate: 97 Resp: 18 BP: 99/69 Patient Position (if appropriate): Sitting Oxygen Therapy SpO2: 100 % O2 Device: Room Air Pain Pain Assessment Pain Scale: 0-10 Pain Score: 2  Pain Type: Surgical pain Pain Location: Leg Pain Orientation: Left Pain Descriptors / Indicators: Aching Pain Onset: Gradual Patients Stated Pain Goal: 0 Pain Intervention(s): Pain med given for  lower pain score than stated, per patient request;Repositioned Multiple Pain Sites: No Home Living/Prior Functioning Home Living Family/patient expects to be discharged to:: Private residence Living Arrangements: Spouse/significant other, Children Available Help at Discharge: Family, Available 24 hours/day Type of Home: House Home Access: Stairs to enter Secretary/administrator of Steps: 5 Entrance Stairs-Rails: None Home Layout: One level Bathroom Shower/Tub: Engineer, manufacturing systems: Standard Bathroom Accessibility: No Additional Comments: for past 5 weeks he has been using walker for household ambulation,  Lives With: Family Prior Function Level of Independence: Independent with basic ADLs, Requires assistive device for independence, Independent with homemaking with ambulation, Independent with gait, Independent with transfers  Able to Take Stairs?: Yes Driving: No Vocation: Retired Administrator, sports Baseline Vision/History: 0 No visual deficits Ability to See in Adequate Light: 0 Adequate Patient Visual Report: No change from baseline Vision Assessment?: No apparent visual deficits Perception  Perception: Within Functional Limits Praxis Praxis: WFL Cognition Cognition Overall Cognitive Status: History of cognitive impairments - at baseline Arousal/Alertness: Awake/alert Orientation Level: Person;Place;Situation Memory: Impaired Memory Impairment: Decreased short term memory Awareness: Impaired Problem Solving: Appears intact Safety/Judgment: Appears intact Brief Interview for Mental Status (BIMS) Repetition of Three Words (First Attempt): 3 Temporal Orientation: Year: Correct Temporal Orientation: Month: Accurate within 5 days Temporal Orientation: Day: Correct Recall: "Sock": Yes, after cueing ("something to wear") Recall: "Blue": Yes, no cue required Recall: "Bed": Yes, no cue required BIMS Summary Score: 14 Sensation Sensation Light Touch: Impaired by gross  assessment Hot/Cold: Appears Intact Proprioception: Appears Intact Stereognosis: Appears Intact Additional Comments: pt reports intermittent phantom sensations in R LE Coordination Gross Motor Movements are Fluid and Coordinated: No Fine Motor Movements are Fluid and Coordinated: No Coordination and Movement Description: impaired by R LE BKA Finger Nose Finger Test: intact R hand, 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 DIPs 9 Hole Peg Test: 9HPT R 28 sec L 34 sec Motor  Motor Motor: Other (comment) Motor - Skilled Clinical Observations: R LE BKA  Trunk/Postural Assessment  Cervical Assessment Cervical Assessment: Within Functional Limits Thoracic Assessment Thoracic Assessment: Within Functional Limits Lumbar Assessment Lumbar Assessment: Within Functional Limits Postural Control Postural Control: Deficits on evaluation Righting Reactions: delayed Protective Responses: delayed  Balance Balance Balance Assessed: Yes Static Sitting Balance Static Sitting - Level of Assistance: 5: Stand by assistance Dynamic Sitting Balance Dynamic Sitting - Level  of Assistance: 5: Stand by Theatre manager Standing - Balance Support: Bilateral upper extremity supported Static Standing - Level of Assistance: 5: Stand by assistance Dynamic Standing Balance Dynamic Standing - Balance Support: Bilateral upper extremity supported Dynamic Standing - Level of Assistance: 4: Min assist Extremity/Trunk Assessment RUE Assessment RUE Assessment: Within Functional Limits General Strength Comments: grip 50 lbs Bly LUE Assessment LUE Assessment: Within Functional Limits General Strength Comments: grip 50 lbs Bly  Care Tool Care Tool Self Care Eating   Eating Assist Level: Set up assist    Oral Care    Oral Care Assist Level: Contact Guard/Toucning assist    Bathing   Body parts bathed by patient: Right arm;Left arm;Chest;Abdomen;Front perineal  area;Right upper leg;Left upper leg;Face Body parts bathed by helper: Right lower leg;Buttocks Body parts n/a: Left lower leg Assist Level: Moderate Assistance - Patient 50 - 74%    Upper Body Dressing(including orthotics)   What is the patient wearing?: Pull over shirt   Assist Level: Contact Guard/Touching assist    Lower Body Dressing (excluding footwear)   What is the patient wearing?: Underwear/pull up;Pants;Ace wrap/stump shrinker Assist for lower body dressing: Maximal Assistance - Patient 25 - 49%    Putting on/Taking off footwear   What is the patient wearing?: Socks;Ted hose Assist for footwear: Maximal Assistance - Patient 25 - 49%       Care Tool Toileting Toileting activity   Assist for toileting: Moderate Assistance - Patient 50 - 74%     Care Tool Bed Mobility Roll left and right activity   Roll left and right assist level: Supervision/Verbal cueing    Sit to lying activity   Sit to lying assist level: Supervision/Verbal cueing    Lying to sitting on side of bed activity   Lying to sitting on side of bed assist level: the ability to move from lying on the back to sitting on the side of the bed with no back support.: Supervision/Verbal cueing     Care Tool Transfers Sit to stand transfer   Sit to stand assist level: Minimal Assistance - Patient > 75%    Chair/bed transfer   Chair/bed transfer assist level: Minimal Assistance - Patient > 75%     Toilet transfer   Assist Level: Minimal Assistance - Patient > 75%     Care Tool Cognition  Expression of Ideas and Wants Expression of Ideas and Wants: 4. Without difficulty (complex and basic) - expresses complex messages without difficulty and with speech that is clear and easy to understand  Understanding Verbal and Non-Verbal Content Understanding Verbal and Non-Verbal Content: 3. Usually understands - understands most conversations, but misses some part/intent of message. Requires cues at times to  understand   Memory/Recall Ability Memory/Recall Ability : Current season;That he or she is in a hospital/hospital unit   Refer to Care Plan for Long Term Goals  SHORT TERM GOAL WEEK 1 OT Short Term Goal 1 (Week 1): STG=LTG's d/t LOS  Recommendations for other services: Neuropsych and Therapeutic Recreation  Stress management   Skilled Therapeutic Intervention ADL ADL Eating: Set up Where Assessed-Eating: Wheelchair Grooming: Contact guard Where Assessed-Grooming: Sitting at sink Upper Body Bathing: Contact guard Where Assessed-Upper Body Bathing: Sitting at sink Lower Body Bathing: Maximal assistance Where Assessed-Lower Body Bathing: Sitting at sink;Standing at sink Upper Body Dressing: Contact guard Where Assessed-Upper Body Dressing: Sitting at sink Lower Body Dressing: Moderate assistance Where Assessed-Lower Body Dressing: Sitting at sink;Standing at sink Toileting: Moderate  assistance Where Assessed-Toileting: Toilet;Bedside Commode Toilet Transfer: Minimal assistance Toilet Transfer Method: Stand pivot Toilet Transfer Equipment: Bedside commode;Grab bars Tub/Shower Transfer: Moderate assistance Tub/Shower Transfer Method: Stand pivot Tub/Shower Equipment: Insurance underwriter: Insurance underwriter Method: Warden/ranger: Shower seat with back ADL Comments: CGA UB self care sinkside, mod A LB sink side and standing with RW with OT managing line and wound vac. Min A SPT- no shower due to wound vac Mobility  Bed Mobility Bed Mobility: Rolling Right;Rolling Left;Supine to Sit;Sit to Supine Rolling Right: Supervision/verbal cueing Rolling Left: Supervision/Verbal cueing Supine to Sit: Supervision/Verbal cueing Sit to Supine: Supervision/Verbal cueing Transfers Sit to Stand: Minimal Assistance - Patient > 75% Stand to Sit: Minimal Assistance - Patient > 75%  Pt seen for full initial OT evaluation and  training session this am. Pt in bed upon OT arrival. OT introduced role of therapy and purpose of session. Pt open to all presented assessment and training this visit.  Dtrs present for session as well as Research officer, trade union.   OT assisted and assessed ADL's, mobility, vision, sensation. cognition/lang, G/FMC, strength and balance throughout session. See above for levels.  Pt will benefit from skilled OT services at CIR to maximize function and safety with recommendation to return home with mod I for BADL's and S for higher level activity with home vs outpt OT services upon d/c home. Pt left at end of session in w/c with L residual limb elevated and chair alarm set, tray table and nurse call bell within reach.    Discharge Criteria: Patient will be discharged from OT if patient refuses treatment 3 consecutive times without medical reason, if treatment goals not met, if there is a change in medical status, if patient makes no progress towards goals or if patient is discharged from hospital.  The above assessment, treatment plan, treatment alternatives and goals were discussed and mutually agreed upon: by patient and by family  Vicenta Dunning 07/24/2023, 4:43 PM

## 2023-07-25 DIAGNOSIS — Z794 Long term (current) use of insulin: Secondary | ICD-10-CM

## 2023-07-25 DIAGNOSIS — G548 Other nerve root and plexus disorders: Secondary | ICD-10-CM

## 2023-07-25 DIAGNOSIS — K5900 Constipation, unspecified: Secondary | ICD-10-CM

## 2023-07-25 DIAGNOSIS — R52 Pain, unspecified: Secondary | ICD-10-CM

## 2023-07-25 LAB — GLUCOSE, CAPILLARY
Glucose-Capillary: 107 mg/dL — ABNORMAL HIGH (ref 70–99)
Glucose-Capillary: 112 mg/dL — ABNORMAL HIGH (ref 70–99)
Glucose-Capillary: 117 mg/dL — ABNORMAL HIGH (ref 70–99)
Glucose-Capillary: 97 mg/dL (ref 70–99)

## 2023-07-25 IMAGING — CT CT KNEE*L* W/CM
3 series · 14 of 33 positions shown, 17 images · IV contrast (omnipaque)
Comparison: Radiograph 10/23/2021

CLINICAL DATA: Soft tissue infection redness and pain

EXAM:
CT OF THE left KNEE WITH CONTRAST
TECHNIQUE: Multidetector CT imaging was performed following the standard
protocol during bolus administration of intravenous contrast.
CONTRAST:  75mL OMNIPAQUE IOHEXOL 350 MG/ML SOLN

[Series 4: lfov ext 3.0 b40s · axial · 0.45mm/px · z∈[+407,+587]mm · 6 of 79 slices shown, 8 images]
[im 13/79  soft-tissue]
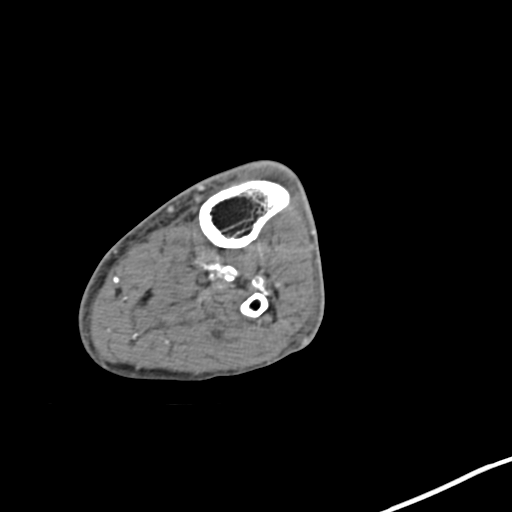
[im 13/79  bone]
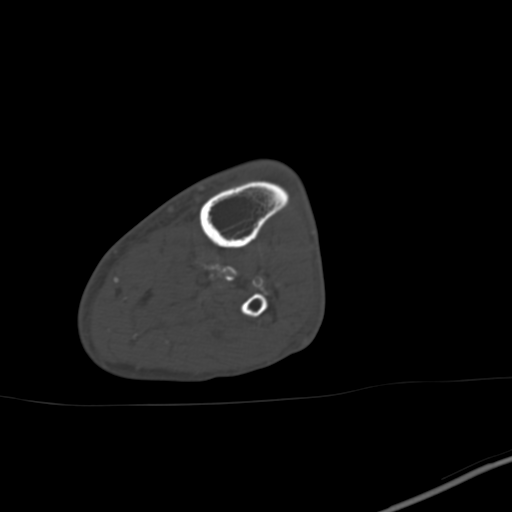
[im 25/79  bone]
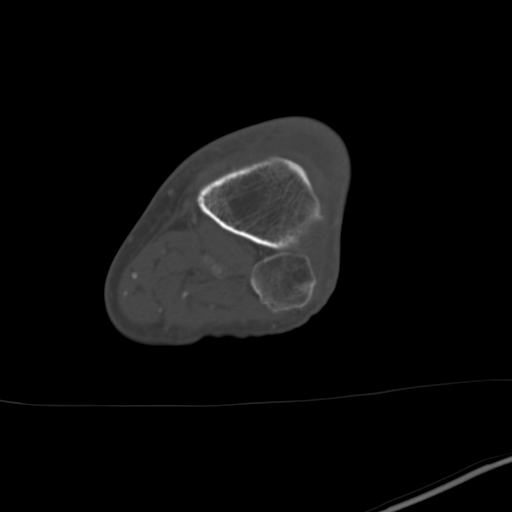
[im 37/79  bone]
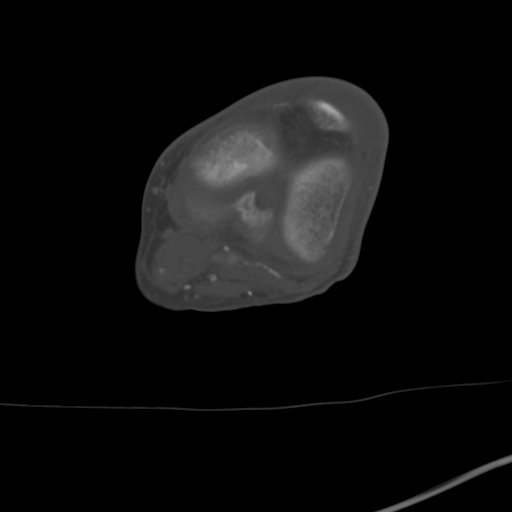
[im 49/79  bone]
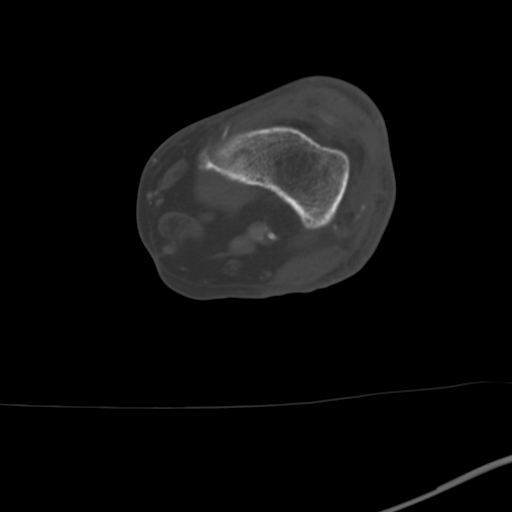
[im 61/79  soft-tissue]
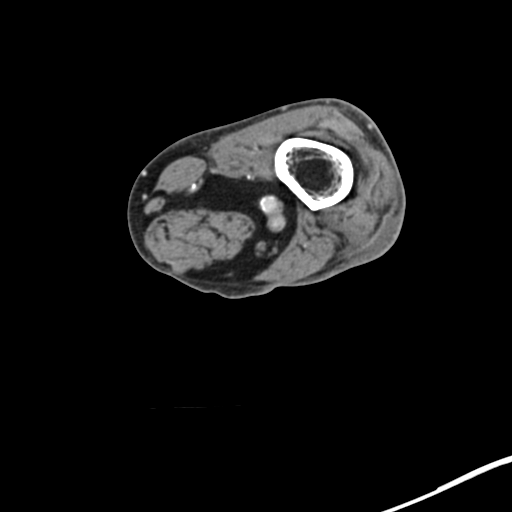
[im 61/79  bone]
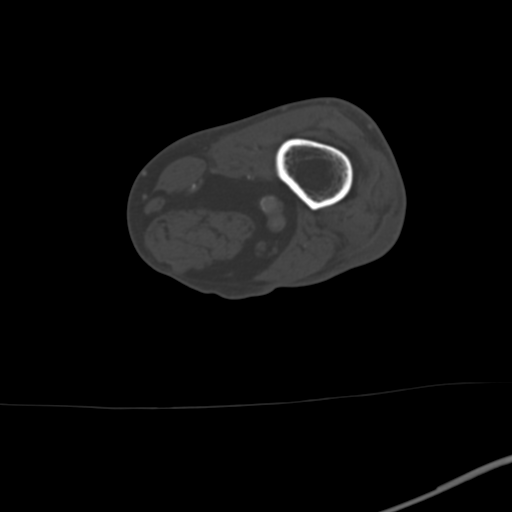
[im 73/79  bone]
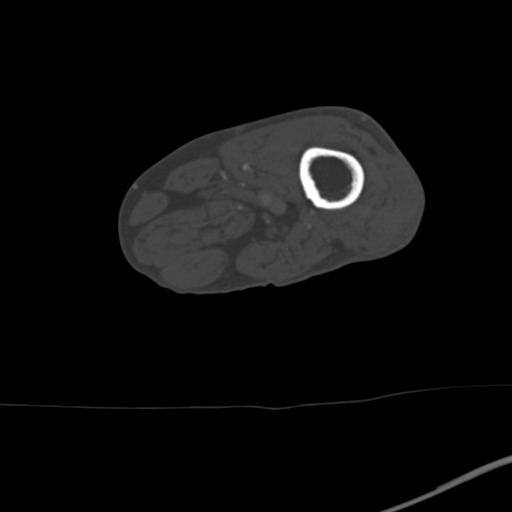

[Series 7: coronalsoft tissue · coronal · 0.31mm/px · 3 of 94 slices shown]
[im 19/94  bone]
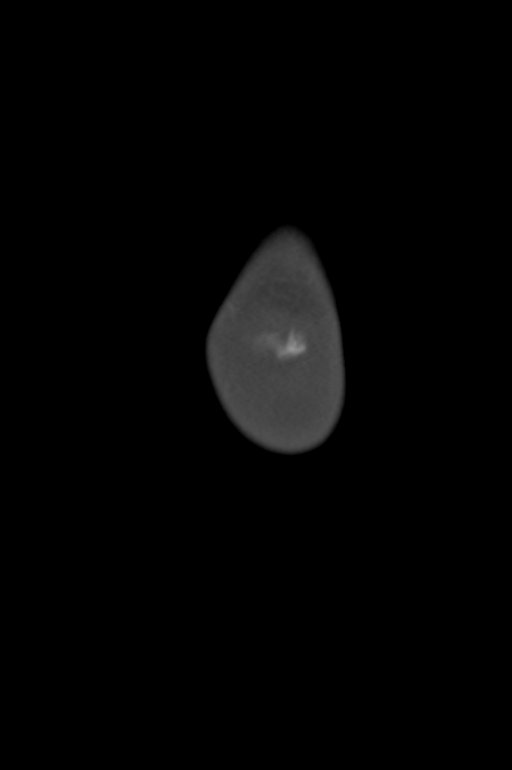
[im 38/94  bone]
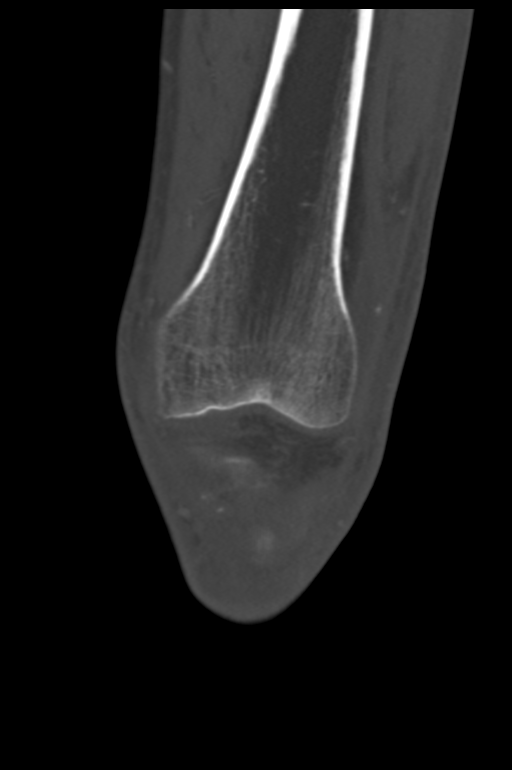
[im 56/94  bone]
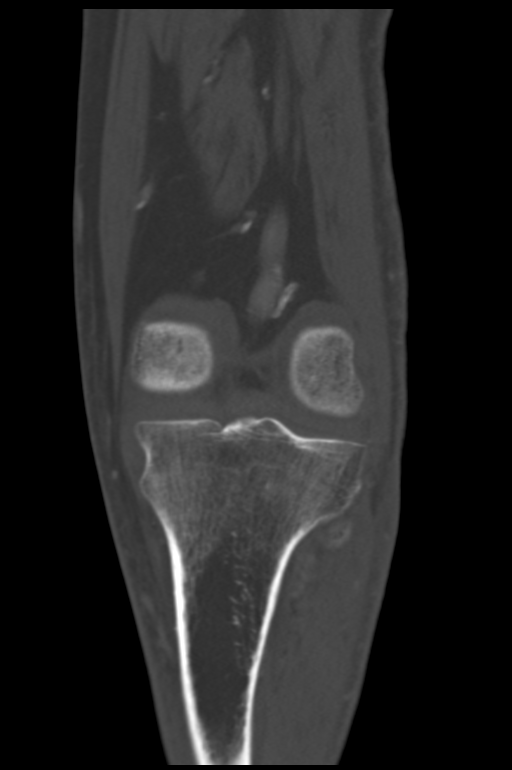

[Series 8: sagittalsoft tissue · sagittal · 0.36mm/px · 5 of 76 slices shown, 6 images]
[im 26/76  bone]
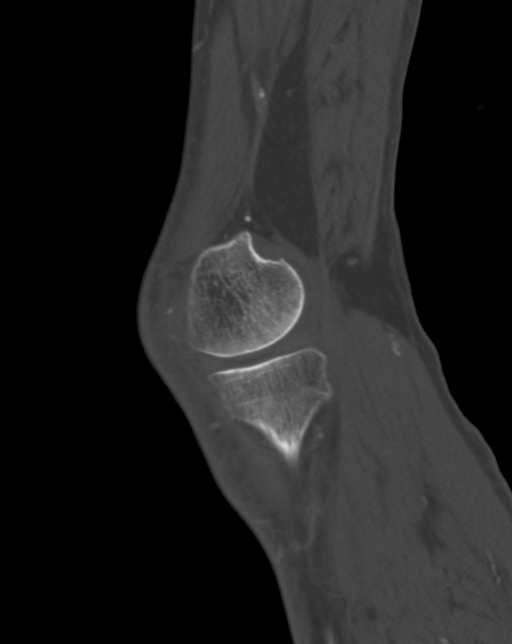
[im 32/76  bone]
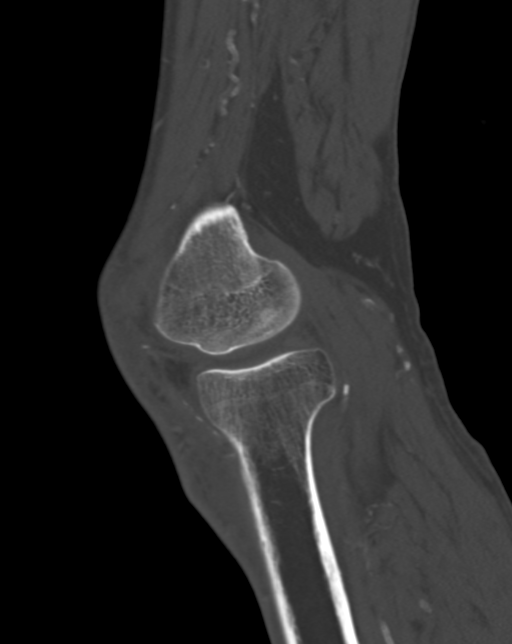
[im 38/76  soft-tissue]
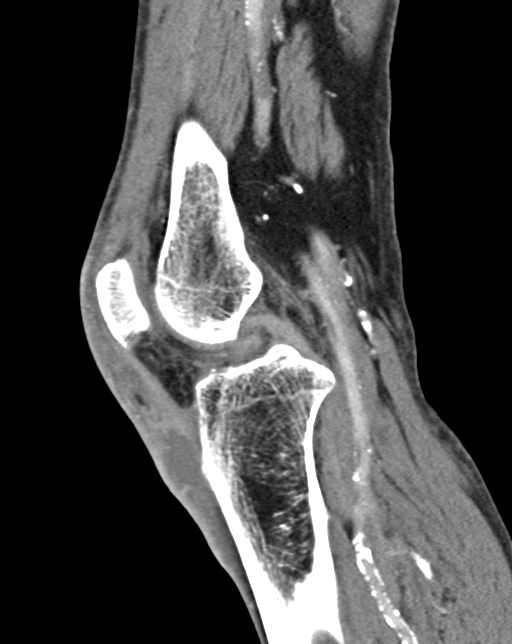
[im 38/76  bone]
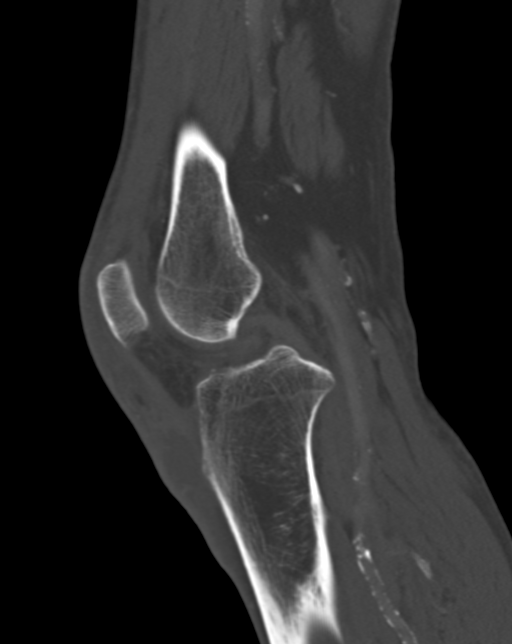
[im 44/76  bone]
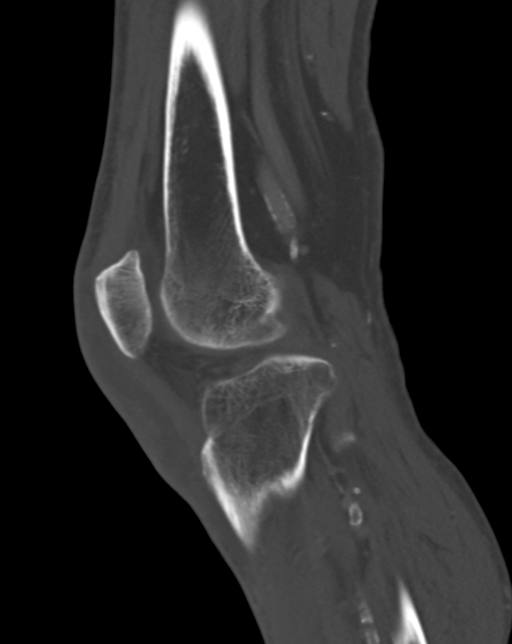
[im 51/76  bone]
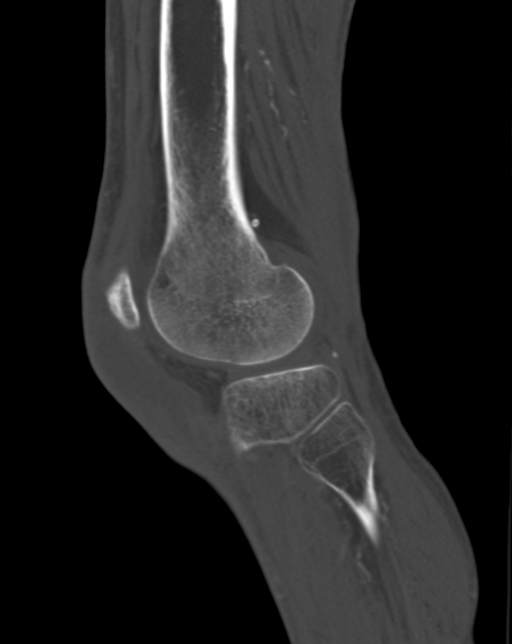

[14 of 33 positions shown; findings below may reference images not displayed]

FINDINGS: Bones/Joint/Cartilage

No fracture or malalignment. Mild medial joint space degenerative
change. Trace knee effusion is present.

Ligaments

Suboptimally assessed by CT.

Muscles and Tendons

No intramuscular fluid collections. Quadriceps and patellar tendons
appear intact.

Soft tissues

Significant soft tissue swelling of the anterior knee. Rim enhancing
fluid collection extends from the level of the lower pole patella,
superficial to the patellar tendon, to the level of the tibial
tuberosity. Fluid collection measures approximately 6.1 cm
craniocaudad by 1 cm AP by 2.7 cm transverse.
IMPRESSION: 1. Rim enhancing fluid collection measuring 6.1 x 1 x 2.7 cm within
the soft tissues anterior to the knee, extending from the level of
lower patella to the tibial tuberosity. Findings could be secondary
to soft tissue abscess versus bursitis of inflammatory or infectious
etiology. Aspiration could be considered for further evaluation.
2. No acute osseous abnormality.

## 2023-07-25 MED ORDER — GABAPENTIN 100 MG PO CAPS
200.0000 mg | ORAL_CAPSULE | Freq: Three times a day (TID) | ORAL | Status: DC
  Administered 2023-07-25 – 2023-08-03 (×26): 200 mg via ORAL
  Filled 2023-07-25 (×26): qty 2

## 2023-07-25 MED ORDER — FERROUS SULFATE 325 (65 FE) MG PO TABS
325.0000 mg | ORAL_TABLET | Freq: Every day | ORAL | Status: DC
  Administered 2023-07-26 – 2023-08-03 (×9): 325 mg via ORAL
  Filled 2023-07-25 (×9): qty 1

## 2023-07-25 NOTE — Progress Notes (Signed)
Occupational Therapy Session Note  Patient Details  Name: Patrick [name] MRN: [id number] Date of Birth: 1945-12-01  Today's Date: 07/25/2023 OT Individual Time: 0800-0900 1st Session; 4098-1191 2nd Session OT Individual Time Calculation (min): 60 min, 74 min     Short Term Goals: Week 1:  OT Short Term Goal 1 (Week 1): STG=LTG's d/t LOS  Skilled Therapeutic Interventions/Progress Updates:   Session 1:  Pt seen for skilled OT session this am with dtr present and Spanish Interpreter. Pt bed level upon OT arrival. BP stable 118/68 R UE with HR 88 and SpO2 99%. Pt slept in TED hose on R LE and educated to remove at bedtime. Pt able to move to EOB with close S, perform sit to stand with CGA and transfer via SPT with RW with CGA. OT managed wound vac line. Seated UB sponge bathing and dressing and oral care with set up. Cues and CGA for sit to stand to RW and mod A for LB dressing including standing for pulling up garments and L limb guard. Instructed in positioning and pain management. Pt able to state time indep and teach back use of call button for safety. Left with dtr bedside, needs, nurse call button in reach.   Pain: 10/10 L residual limb pain at incision with elevation and pain meds given at start of session prior to bathing    Session 2:  Dtr and [place] Interpreter present for session. Wound vac still in place. Pt able to self propel w/c 50 ft then 75 ft back to room at end of session. OT transported other distances to ADL apartment and progressed standing balance for simple counter top activity x 3 trials with min A for 2 min intervals and B UE support. Cues for hand placement and to maintain L residual limb position in standing. Transported to ortho gym for UE therex on [employer] 1 min forward x 2 and backwards x 2 with rest betweeen in sitting.  Worked at end on strategies for recall of session activity with mod cues for verbalizing back to dtr. Left pt w/c level with needs, nurse  call button and chair alarm set.   Pain: 5/10 L residual limb at start of session, worked on standing and repositioning as well as therex with pain reduced to 2-3/10    Therapy Documentation Precautions:  Precautions Precautions: Fall Precaution Comments: wound vac L residual limb, R LE BKA Restrictions Weight Bearing Restrictions: Yes LLE Weight Bearing: Non weight bearing Other Position/Activity Restrictions: L limb protector re-ordered as it was lost from acute   Therapy/Group: Individual Therapy  Vicenta Dunning 07/25/2023, 7:50 AM

## 2023-07-25 NOTE — IPOC Note (Signed)
Overall Plan of Care [facility]) Patient Details Name: Patrick [name] MRN: [id number] DOB: 11-Dec-1945  Admitting Diagnosis: Below-knee amputation of left lower extremity [facility])  Hospital Problems: Principal Problem:   Below-knee amputation of left lower extremity (HCC)     Functional Problem List: Nursing Safety, Skin Integrity, Edema, Endurance, Medication Management, Pain  PT Balance, Edema, Endurance, Motor, Pain, Safety, Sensory, Skin Integrity  OT Balance, Nutrition, Pain, Cognition, Safety, Endurance, Motor, Skin Integrity  SLP    TR         Basic ADL's: OT Grooming, Bathing, Dressing, Toileting     Advanced  ADL's: OT Simple Meal Preparation, Laundry, Light Housekeeping     Transfers: PT Bed to Chair, Bed Mobility, Customer service manager, Tub/Shower     Locomotion: PT Ambulation, Psychologist, prison and probation services, Stairs     Additional Impairments: OT None  SLP        TR      Anticipated Outcomes Item Anticipated Outcome  Self Feeding indep  Swallowing      Basic self-care  mod I  Toileting  mod I   Bathroom Transfers Supervision  Bowel/Bladder  continent bowel/bladder  Transfers  mod I  Locomotion  supervision  Communication     Cognition     Pain  less than 4  Safety/Judgment  no falls   Therapy Plan: PT Intensity: Minimum of 1-2 x/day ,45 to 90 minutes PT Frequency: 5 out of 7 days PT Duration Estimated Length of Stay: 7-10 days OT Intensity: Minimum of 1-2 x/day, 45 to 90 minutes OT Frequency: 5 out of 7 days OT Duration/Estimated Length of Stay: 7-10 d     Team Interventions: Nursing Interventions Patient/Family Education, Medication Management, Psychosocial Support, Skin Care/Wound Management, Disease Management/Prevention, Pain Management, Discharge Planning  PT interventions Discharge planning, Ambulation/gait training, Functional mobility training, Psychosocial support, Therapeutic Activities, Visual/perceptual remediation/compensation,  Balance/vestibular training, Disease management/prevention, Neuromuscular re-education, Skin care/wound management, Therapeutic Exercise, Wheelchair propulsion/positioning, Cognitive remediation/compensation, DME/adaptive equipment instruction, Pain management, Splinting/orthotics, UE/LE Strength taining/ROM, Community reintegration, Development worker, international aid stimulation, Equities trader education, Museum/gallery curator, UE/LE Coordination activities  OT Interventions Warden/ranger, Community reintegration, Disease mangement/prevention, Development worker, international aid stimulation, Neuromuscular re-education, Patient/family education, Self Care/advanced ADL retraining, Splinting/orthotics, Therapeutic Exercise, UE/LE Coordination activities, Wheelchair propulsion/positioning, Cognitive remediation/compensation, Discharge planning, DME/adaptive equipment instruction, Functional mobility training, Pain management, Psychosocial support, Skin care/wound managment, Therapeutic Activities, UE/LE Strength taining/ROM, Visual/perceptual remediation/compensation  SLP Interventions    TR Interventions    SW/CM Interventions Discharge Planning, Psychosocial Support, Patient/Family Education   Barriers to Discharge MD  Medical stability  Nursing Decreased caregiver support, Home environment access/layout, Wound Care, Weight bearing restrictions home with family to a 1 level with 5 ste no rails  PT Home environment access/layout, Incontinence, Wound Care, Weight bearing restrictions pain  OT Inaccessible home environment, Home environment access/layout, Wound Care new L BKA with STE home  SLP      SW       Team Discharge Planning: Destination: PT-Home ,OT- Home , SLP-  Projected Follow-up: PT-Outpatient PT, OT-  Home health OT, Outpatient OT, SLP-  Projected Equipment Needs: PT-To be determined, OT- Rolling walker with 5" wheels, Tub/shower bench, SLP-  Equipment Details: PT-pt needs RW, likely shower chair for  stair navigation, OT-RW (already brought by MSW), TTB Patient/family involved in discharge planning: PT- Patient, Family member/caregiver,  OT- , SLP-   MD ELOS: 7 Medical Rehab Prognosis:  Excellent Assessment: The patient has been admitted for CIR therapies with the diagnosis of  L BKA due PAD  and Gangrene of left foot . The team will be addressing functional mobility, strength, stamina, balance, safety, adaptive techniques and equipment, self-care, bowel and bladder mgt, patient and caregiver education. Goals have been set at supervision PT/OT. Anticipated discharge destination is home.        See Team Conference Notes for weekly updates to the plan of care

## 2023-07-25 NOTE — Progress Notes (Signed)
Physical Therapy Session Note  Patient Details  Name: Patrick [name] MRN: [id number] Date of Birth: Nov 27, 1945  Today's Date: 07/25/2023 PT Individual Time: 1002-1115 PT Individual Time Calculation (min): 73 min   Short Term Goals: Week 1:  PT Short Term Goal 1 (Week 1): STG=LTG 2/2 ELOS  Skilled Therapeutic Interventions/Progress Updates: Pt presents sitting in w/c and agreeable to therapy through interpretation from granddaughter.  Scheduled interpreter arrived and took over translations.  Pt wheeled through hallways w/ CGA.  Pt transfers sit to stand w/ min A and step-pivot to Nu-step w/ min and RW.  Verbal cues for foot clearance.  Pt performed x 10' at level 3 and increasing to 4 for total of 508 steps and averaging 50 spm.  Pt wheeled to main gym and amb w/ RW to mat table w/ min A, but continued cues from interpreter for UE extension for foot clearance, improvement noted.  Pt performed supine there ex, knee/hip flexion, bridging on bolster 3 x 15, sidelying abd LLE and then prone positioning x 5' performing knee flex/ext.  Pt requires CGA for rolling and position changes.  Pt amb x 20' to w/c w/ min A.  Pt wheeled self almost to room before PT completed.  Pt remained sitting in w/c w/ chair alarm on and all needs in reach, wound vac re-connected, granddaughter in room.      Therapy Documentation Precautions:  Precautions Precautions: Fall Precaution Comments: wound vac L residual limb, R LE BKA Restrictions Weight Bearing Restrictions: Yes LLE Weight Bearing: Non weight bearing Other Position/Activity Restrictions: L limb protector re-ordered as it was lost from acute General:   Vital Signs:   Pain:0/10 Pain Assessment Pain Scale: 0-10 Pain Score: 3       Therapy/Group: Individual Therapy  Lucio Edward 07/25/2023, 11:57 AM

## 2023-07-25 NOTE — Progress Notes (Addendum)
PROGRESS NOTE   Subjective/Complaints: No acute events overnight.  Reports having issues with phantom pain in the morning.  ROS: Patient denies fever, rash, sore throat, blurred vision, dizziness, nausea, vomiting, diarrhea, cough, shortness of breath or chest pain, back/neck pain, headache, or mood change. + Residual limb pain and phantom pain   Objective:   No results found. Recent Labs    07/24/23 0654  WBC 4.9  HGB 9.8*  HCT 30.5*  PLT 376   Recent Labs    07/24/23 0654  NA 132*  K 4.0  CL 99  CO2 24  GLUCOSE 107*  BUN 24*  CREATININE 0.53*  CALCIUM 8.7*    Intake/Output Summary (Last 24 hours) at 07/25/2023 1447 Last data filed at 07/25/2023 1332 Gross per 24 hour  Intake 598 ml  Output --  Net 598 ml        Physical Exam: Vital Signs Blood pressure 126/79, pulse 90, temperature 99.2 F (37.3 C), temperature source Oral, resp. rate 16, height 5\' 1"  (1.549 m), SpO2 99%.   General: No apparent distress, working with therapy in the gym HEENT: Head is normocephalic, atraumatic,MMM Neck: Supple without JVD or lymphadenopathy Heart: Reg rate and rhythm. Chest: CTA bilaterally without wheezes Abdomen: Soft, non-tender, non-distended, bowel sounds positive Extremities:  Left BKA with wound VAC in place  Psych: Pt's affect is appropriate. Pt is cooperative Skin: warm and dry Neuro:  Makes eye contact and interacts appropriately.  Interpreter present who assisted with interview.  Following simple commands.   Assessment/Plan: 1. Functional deficits which require 3+ hours per day of interdisciplinary therapy in a comprehensive inpatient rehab setting. Physiatrist is providing close team supervision and 24 hour management of active medical problems listed below. Physiatrist and rehab team continue to assess barriers to discharge/monitor patient progress toward functional and medical goals  Care  Tool:  Bathing    Body parts bathed by patient: Right arm, Left arm, Chest, Abdomen, Front perineal area, Right upper leg, Left upper leg, Face   Body parts bathed by helper: Right lower leg, Buttocks Body parts n/a: Left lower leg   Bathing assist Assist Level: Moderate Assistance - Patient 50 - 74%     Upper Body Dressing/Undressing Upper body dressing   What is the patient wearing?: Pull over shirt    Upper body assist Assist Level: Contact Guard/Touching assist    Lower Body Dressing/Undressing Lower body dressing      What is the patient wearing?: Underwear/pull up, Pants, Ace wrap/stump shrinker     Lower body assist Assist for lower body dressing: Maximal Assistance - Patient 25 - 49%     Toileting Toileting    Toileting assist Assist for toileting: Moderate Assistance - Patient 50 - 74%     Transfers Chair/bed transfer  Transfers assist     Chair/bed transfer assist level: Minimal Assistance - Patient > 75%     Locomotion Ambulation   Ambulation assist      Assist level: Minimal Assistance - Patient > 75% Assistive device: Walker-rolling Max distance: 20   Walk 10 feet activity   Assist     Assist level: Minimal Assistance - Patient > 75% Assistive  device: Walker-rolling   Walk 50 feet activity   Assist Walk 50 feet with 2 turns activity did not occur: Safety/medical concerns         Walk 150 feet activity   Assist Walk 150 feet activity did not occur: Safety/medical concerns         Walk 10 feet on uneven surface  activity   Assist Walk 10 feet on uneven surfaces activity did not occur: Safety/medical concerns         Wheelchair     Assist Is the patient using a wheelchair?: Yes Type of Wheelchair: Manual    Wheelchair assist level: Contact Guard/Touching assist Max wheelchair distance: 150+    Wheelchair 50 feet with 2 turns activity    Assist        Assist Level: Minimal Assistance - Patient  > 75%   Wheelchair 150 feet activity     Assist      Assist Level: Minimal Assistance - Patient > 75%   Blood pressure 126/79, pulse 90, temperature 99.2 F (37.3 C), temperature source Oral, resp. rate 16, height 5\' 1"  (1.549 m), SpO2 99%.   Medical Problem List and Plan: 1. Functional deficits secondary to L BKA due PAD and Gangrene of left foot              -patient may not shower             -ELOS/Goals: 7 days, supervision PT, supervision OT  -Continue CIR Therapeutic grounds pass prescribed, discussed that grounds pass timeframe is 1 hour and can be used during the times of 10:00am and 6:30pm and should no be used during therapy hours. Advised patient to check out and in at nursing station when using grounds pass    2. Impaired mobility: continue lovenox             -antiplatelet therapy: DAPT 3. Pain Management: oxycodone--Oxycontin 10 mg BID added (using oxy 15 TID in past 24 hours). as reports pain nagging and does not ask. Daughter has to advocate for him.              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 Mood/Behavior/Sleep:  LCSW to follow for evaluation and support.              -antipsychotic agents: N/A             --sleeping well except for pain 5. Neuropsych/cognition: This patient may not be fully capable of making decisions on his own behalf. 6. Skin/Wound Care:  Routine pressure relief measures. Continue wound VAC for 7-10 days.  7. Fluids/Electrolytes/Nutrition: Monitor I/O. Family concerned about his recent wt  loss/poor intake             --ensure max and beneprotein added to promote healing.              --continue Zinc, Juven and Vitamin C             --diet down graded to D2 (per family )due to edentulous state. (They are willing to further modify to puree if D2 still difficult to chew.) 8. T2DM: Monitor BS ac/hs. Continue Metformin 500 mg bid  and Jardiance 25 mg. Will ask daughter to bring in Trulicity.  --Continue   novolog 4 units TID meal coverage for now-->may need to  d/c if BS drop with increase in activity.  -9/26 Will DC mealtime novolog to avoid hypoglycemia -9/27 CBGs controlled overall, continue to monitor trend 9. Anemia: Hgb has been trending down since July surgery. Wt loss and poor intake past 3 months. Will order anemia panel.   -9/27 Hgb was 9.8 yesterday, will add ferrous sulfate daily for iron deficiency 10. HTN: Monitor BP TID--continue Lisinopril and hydrochlorothiazide daily.   -9/26 BP soft, will discontinue hydrochlorothiazide  -9/27 monitor response to medication change, BP stable today    07/24/2023    7:31 PM 07/24/2023    2:41 PM 07/24/2023    2:00 PM  Vitals with BMI  Systolic 126 99 109  Diastolic 79 69 66  Pulse 90 97 94    11. Constipation: Will add Senna to colace.  --Dose of MOM today as had been straining without results.  -9/26 sorbitol 60ml ordered -9/27 constipation improved with bowel movement yesterday 12. PAD: On DAPT and Lipitor.    13. Screening for vitamin D deficiency: check vitamin D level tomor  14.  Hyponatremia.  Mild continue to monitor    LOS: 2 days A FACE TO FACE EVALUATION WAS PERFORMED  Fanny Dance 07/25/2023, 2:47 PM

## 2023-07-26 DIAGNOSIS — E1142 Type 2 diabetes mellitus with diabetic polyneuropathy: Secondary | ICD-10-CM

## 2023-07-26 DIAGNOSIS — L89611 Pressure ulcer of right heel, stage 1: Secondary | ICD-10-CM

## 2023-07-26 LAB — GLUCOSE, CAPILLARY
Glucose-Capillary: 108 mg/dL — ABNORMAL HIGH (ref 70–99)
Glucose-Capillary: 135 mg/dL — ABNORMAL HIGH (ref 70–99)
Glucose-Capillary: 119 mg/dL — ABNORMAL HIGH (ref 70–99)
Glucose-Capillary: 125 mg/dL — ABNORMAL HIGH (ref 70–99)

## 2023-07-26 MED ORDER — SENNOSIDES-DOCUSATE SODIUM 8.6-50 MG PO TABS
2.0000 | ORAL_TABLET | Freq: Two times a day (BID) | ORAL | Status: DC
  Administered 2023-07-26 – 2023-08-01 (×10): 2 via ORAL
  Filled 2023-07-26 (×11): qty 2

## 2023-07-26 NOTE — Plan of Care (Signed)
  Problem: Consults Goal: RH LIMB LOSS PATIENT EDUCATION Description: Description: See Patient Education module for eduction specifics. Outcome: Progressing   Problem: RH BOWEL ELIMINATION Goal: RH STG MANAGE BOWEL WITH ASSISTANCE Description: STG Manage Bowel with mod I Assistance. Outcome: Progressing Goal: RH STG MANAGE BOWEL W/MEDICATION W/ASSISTANCE Description: STG Manage Bowel with Medication with min Assistance. Outcome: Progressing   Problem: RH BLADDER ELIMINATION Goal: RH STG MANAGE BLADDER WITH ASSISTANCE Description: STG Manage Bladder With mod I Assistance Outcome: Progressing   Problem: RH SKIN INTEGRITY Goal: RH STG SKIN FREE OF INFECTION/BREAKDOWN Description: Incision will be free of infection/breakdown with min assist  Outcome: Progressing Goal: RH STG MAINTAIN SKIN INTEGRITY WITH ASSISTANCE Description: STG Maintain Skin Integrity With cueing Assistance. Outcome: Progressing Goal: RH STG ABLE TO PERFORM INCISION/WOUND CARE W/ASSISTANCE Description: STG Able To Perform Incision/Wound Care With min Assistance. Outcome: Progressing   Problem: RH SAFETY Goal: RH STG ADHERE TO SAFETY PRECAUTIONS W/ASSISTANCE/DEVICE Description: STG Adhere to Safety Precautions With cueing Assistance/Device. Outcome: Progressing Goal: RH STG DECREASED RISK OF FALL WITH ASSISTANCE Description: STG Decreased Risk of Fall With min Assistance. Outcome: Progressing   Problem: RH PAIN MANAGEMENT Goal: RH STG PAIN MANAGED AT OR BELOW PT'S PAIN GOAL Description: Less than 4 with PRN medications min assist  Outcome: Progressing   Problem: RH KNOWLEDGE DEFICIT LIMB LOSS Goal: RH STG INCREASE KNOWLEDGE OF SELF CARE AFTER LIMB LOSS Description: Patient/caregiver will be able to manage medications, dressing changes, and diet modifications to improve cholesterol levels and A1C of 6.8 from nursing educations and nursing handouts independently   Outcome: Progressing   Problem:  Education: Goal: Knowledge of the prescribed therapeutic regimen will improve Outcome: Progressing Goal: Ability to verbalize activity precautions or restrictions will improve Outcome: Progressing Goal: Understanding of discharge needs will improve Outcome: Progressing   Problem: Activity: Goal: Ability to perform//tolerate increased activity and mobilize with assistive devices will improve Outcome: Progressing   Problem: Clinical Measurements: Goal: Postoperative complications will be avoided or minimized Outcome: Progressing   Problem: Self-Care: Goal: Ability to meet self-care needs will improve Outcome: Progressing   Problem: Self-Concept: Goal: Ability to maintain and perform role responsibilities to the fullest extent possible will improve Outcome: Progressing   Problem: Pain Management: Goal: Pain level will decrease with appropriate interventions Outcome: Progressing   Problem: Skin Integrity: Goal: Demonstration of wound healing without infection will improve Outcome: Progressing   Problem: Education: Goal: Understanding of CV disease, CV risk reduction, and recovery process will improve Outcome: Progressing Goal: Individualized Educational Video(s) Outcome: Progressing   Problem: Activity: Goal: Ability to return to baseline activity level will improve Outcome: Progressing   Problem: Cardiovascular: Goal: Ability to achieve and maintain adequate cardiovascular perfusion will improve Outcome: Progressing Goal: Vascular access site(s) Level 0-1 will be maintained Outcome: Progressing   Problem: RH SAFETY Goal: RH STG ADHERE TO SAFETY PRECAUTIONS W/ASSISTANCE/DEVICE Description: STG Adhere to Safety Precautions With cueing Assistance/Device. Outcome: Progressing   Problem: RH PAIN MANAGEMENT Goal: RH STG PAIN MANAGED AT OR BELOW PT'S PAIN GOAL Description: Less than 4 with PRN medications min assist  Outcome: Progressing   Problem: RH KNOWLEDGE DEFICIT  LIMB LOSS Goal: RH STG INCREASE KNOWLEDGE OF SELF CARE AFTER LIMB LOSS Description: Patient/caregiver will be able to manage medications, dressing changes, and diet modifications to improve cholesterol levels and A1C of 6.8 from nursing educations and nursing handouts independently   Outcome: Progressing

## 2023-07-26 NOTE — Progress Notes (Signed)
PROGRESS NOTE   Subjective/Complaints: RIght heel pain , no trauma , notes while in bed  ROS: Patient denies CP, SOB, N/V/D Objective:   No results found. Recent Labs    07/24/23 0654  WBC 4.9  HGB 9.8*  HCT 30.5*  PLT 376   Recent Labs    07/24/23 0654  NA 132*  K 4.0  CL 99  CO2 24  GLUCOSE 107*  BUN 24*  CREATININE 0.53*  CALCIUM 8.7*    Intake/Output Summary (Last 24 hours) at 07/26/2023 1107 Last data filed at 07/26/2023 0830 Gross per 24 hour  Intake 476 ml  Output 950 ml  Net -474 ml        Physical Exam: Vital Signs Blood pressure 139/74, pulse 81, temperature 98.7 F (37.1 C), temperature source Oral, resp. rate 14, height 5\' 1"  (1.549 m), SpO2 99%.   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 Pt is cooperative Skin: warm and dry Neuro:  Makes eye contact and interacts appropriately.  Interpreter present who assisted with interview.  Following simple commands.   Assessment/Plan: 1. Functional deficits which require 3+ hours per day of interdisciplinary therapy in a comprehensive inpatient rehab setting. Physiatrist is providing close team supervision and 24 hour management of active medical problems listed below. Physiatrist and rehab team continue to assess barriers to discharge/monitor patient progress toward functional and medical goals  Care Tool:  Bathing    Body parts bathed by patient: Right arm, Left arm, Chest,  Abdomen, Front perineal area, Right upper leg, Left upper leg, Face   Body parts bathed by helper: Right lower leg, Buttocks Body parts n/a: Left lower leg   Bathing assist Assist Level: Moderate Assistance - Patient 50 - 74%     Upper Body Dressing/Undressing Upper body dressing   What is the patient wearing?: Pull over shirt    Upper body assist Assist Level: Contact Guard/Touching assist    Lower Body Dressing/Undressing Lower body dressing      What is the patient wearing?: Underwear/pull up, Pants, Ace wrap/stump shrinker     Lower body assist Assist for lower body dressing: Maximal Assistance - Patient 25 - 49%     Toileting Toileting    Toileting assist Assist for toileting: Moderate Assistance - Patient 50 - 74%     Transfers Chair/bed transfer  Transfers assist     Chair/bed transfer assist level: Minimal Assistance - Patient > 75%     Locomotion Ambulation   Ambulation assist      Assist level: Minimal Assistance - Patient > 75% Assistive device: Walker-rolling Max distance: 20   Walk 10 feet activity   Assist     Assist level: Minimal Assistance - Patient > 75% Assistive device: Walker-rolling  Walk 50 feet activity   Assist Walk 50 feet with 2 turns activity did not occur: Safety/medical concerns         Walk 150 feet activity   Assist Walk 150 feet activity did not occur: Safety/medical concerns         Walk 10 feet on uneven surface  activity   Assist Walk 10 feet on uneven surfaces activity did not occur: Safety/medical concerns         Wheelchair     Assist Is the patient using a wheelchair?: Yes Type of Wheelchair: Manual    Wheelchair assist level: Contact Guard/Touching assist Max wheelchair distance: 150+    Wheelchair 50 feet with 2 turns activity    Assist        Assist Level: Minimal Assistance - Patient > 75%   Wheelchair 150 feet activity     Assist      Assist Level:  Minimal Assistance - Patient > 75%   Blood pressure 139/74, pulse 81, temperature 98.7 F (37.1 C), temperature source Oral, resp. rate 14, height 5\' 1"  (1.549 m), SpO2 99%.   Medical Problem List and Plan: 1. 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 Advised patient to check out and in at nursing station when using grounds pass    2. Impaired mobility: continue lovenox             -antiplatelet therapy: DAPT 3. Pain Management: oxycodone--Oxycontin 10 mg BID added (using oxy 15 TID in past 24 hours). as reports pain nagging and does not ask. Daughter has to advocate for him.              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 Mood/Behavior/Sleep:  LCSW to follow for evaluation and support.              -antipsychotic agents: N/A             --sleeping well except for pain 5. Neuropsych/cognition: This patient may not be fully capable of making decisions on his own behalf. 6. Skin/Wound Care:  Routine pressure relief measures. Continue wound VAC for 7-10 days.  Gr 1 decub right heel prevalon boot ordered  7. Fluids/Electrolytes/Nutrition: Monitor I/O. Family concerned about his recent wt  loss/poor intake             --ensure max and beneprotein added to promote healing.              --continue Zinc, Juven and Vitamin C             --diet down graded to D2 (per family )due to edentulous state. (They are willing to further modify to puree if D2 still difficult to chew.) 8. T2DM: Monitor BS ac/hs. Continue Metformin 500 mg bid and Jardiance 25 mg. Will  ask daughter to bring in Trulicity.  --Continue   novolog 4 units TID  meal coverage for now-->may need to d/c if BS drop with increase in activity.  -9/26 Will DC mealtime novolog to avoid hypoglycemia CBG (last 3)  Recent Labs    07/25/23 1655 07/25/23 2101 07/26/23 0548  GLUCAP 117* 97 108*    9. Anemia: Hgb has been trending down since July surgery. Wt loss and poor intake past 3 months. Will order anemia panel.   -9/27 Hgb was 9.8 yesterday, will add ferrous sulfate daily for iron deficiency 10. HTN: Monitor BP TID--continue Lisinopril and hydrochlorothiazide daily.   -9/26 BP soft, will discontinue hydrochlorothiazide  -9/27 monitor response to medication change, BP stable today    07/26/2023    7:29 AM 07/26/2023    3:54 AM 07/25/2023    7:40 PM  Vitals with BMI  Systolic 139 127 829  Diastolic 74 79 70  Pulse 81 78 82    11. Constipation: Will add Senna to colace.  --Dose of MOM today as had been straining without results.  -9/26 sorbitol 60ml ordered with BM  No BM x 2 d increase senna to 2 tab BID   12. PAD: On DAPT and Lipitor.    13. Screening for vitamin D deficiency: check vitamin D level tomor  14.  Hyponatremia.  Mild continue to monitor    LOS: 3 days A FACE TO FACE EVALUATION WAS PERFORMED  Erick Colace 07/26/2023, 11:07 AM

## 2023-07-27 LAB — GLUCOSE, CAPILLARY
Glucose-Capillary: 113 mg/dL — ABNORMAL HIGH (ref 70–99)
Glucose-Capillary: 115 mg/dL — ABNORMAL HIGH (ref 70–99)
Glucose-Capillary: 114 mg/dL — ABNORMAL HIGH (ref 70–99)
Glucose-Capillary: 154 mg/dL — ABNORMAL HIGH (ref 70–99)

## 2023-07-27 NOTE — Progress Notes (Incomplete)
Physical Therapy Session Note  Patient Details  Name: Patrick [name] MRN: [id number] Date of Birth: 1946/01/25  Today's Date: 07/27/2023 PT Individual Time: 1121-1206, 4098-1191 PT Individual Time Calculation (min): 45 min, 75 min   Short Term Goals: Week 1:  PT Short Term Goal 1 (Week 1): STG=LTG 2/2 ELOS  Skilled Therapeutic Interventions/Progress Updates:      Treatment Session 1  Pt supine in bed upon arrival. Pt agreeable to therapy. Pt reports 5/10 knee/medial aspect of distal residual limb pain after donning shrinker, pt performed L LE LAQ and quad sets x10 to facilitate improved knee extension and repositioned shrinker. RN administered pain medication. Pt reports pain reduced to 3/10. Interpretor present throughout session to translate.   Therapist managing wound vac throughout session.   Pt performed stand pivot transfer with CGA bed to WC. Discussed home entry set up, pt son reports pt has L ascending handrail. Pt transported dependent in [facility] for time/energy management for stair navigation trail.   Therapist opted to trial shower chair technique versus hopping technique as pt only has one rail at home, and pt does not have enough LE clearance for 6 inch steps.   Therapist provided demonstration of shower technique. Pt ascended/descended 4 6 inch steps with shower chair technique with CGA, with verbal cues provided for UE positioning, and therapist managing shower chair. Seated rest break required between ascent and descent 2/2 fatigue.   Pt seated in recliner at end of session with L LE on amputee pad, education provided to complete quad sets for improved knee extension positioning. Pt needs within reach, seatbelt alarm on and family in room.   Treatment Session 2    Pt seated in WC upon arrival. Pt agreeable to therapy. Pt denies any pain. Translator present throughout session to interpret.   Pt ascended/descended 4 6 inch steps with shower chair technique with daughter  present. Trailed pt manuevering the shower chair, pt requires min A-mod A with ascending, with max verbal cues for technique and placement, therapist opted not to trail with descending as pt trying to reach with R UE to reach behind back despite cues for technique and safety.  Plan to trial stair navigation next session with transfer tub bench to limit the amount of equipment the family needs to purchase out of pocket as they do not have insurance. Education provided to daughter that pt will likely need asssitance with manuevering the chair 2/2 cognitive deficits. Plan to provide hands on training closer to discharge.   Therapist provided demonstration of donning/doffing leg rests. Pt reports difficulty seeing the prongs when previously attempting with OT. Therapist placed yellow dycem near prongs as indicator to pt. Pt able to donn/doff x5 on each side with supervision/min A, pt requiring max verbal cues for differentiating R versus L leg rest, and proper orientation of leg rests. Pt would benefit from continued practice.   Pt self propelled WC up and down ramp with min A to get over initial incline for ascending and CGA/supervision for remainder, verbal cues provided for technique.   Pt self propelled WC around cones, navigating obstacles, verbal cues provided intermittently for obstacle navigation.   Pt ambulated 65 feet with RW and CGA/min A with fatigue, verbal cues provided for technique, with emphasis on placement of RW, and tricep extension for improved technique, pt demos improved technique after demonstration with verbal cues, but difficult to sustain with fatigue.   Pt performed stand pivot transfer with RW and CGA/min A to car simulator at  height of SUV verbal cues provided for technique for improved LE clearance.   Pt supine in bed at end of session with all needs within reach and bed alarm on with family in room.     Therapy Documentation Precautions:  Precautions Precautions:  Fall Precaution Comments: wound vac L residual limb, R LE BKA Restrictions Weight Bearing Restrictions: Yes LLE Weight Bearing: Non weight bearing Other Position/Activity Restrictions: L limb protector re-ordered as it was lost from acute  Therapy/Group: Individual Therapy  [facility] Has[place], [place], DPT  07/27/2023, 7:50 AM

## 2023-07-27 NOTE — Progress Notes (Signed)
Occupational Therapy Session Note  Patient Details  Name: Patrick [name] MRN: [id number] Date of Birth: 02/20/1946  {CHL IP REHAB OT TIME CALCULATIONS:[id number]}   Short Term Goals: Week 1:  OT Short Term Goal 1 (Week 1): STG=LTG's d/t LOS  Skilled Therapeutic Interventions/Progress Updates:     Patient agreeable to participate in OT session. Reports *** pain level.   Patient participated in skilled OT session focusing on ***. Therapist facilitated/assessed/developed/educated/integrated/elicited *** in order to improve/facilitate/promote   Therapy Documentation Precautions:  Precautions Precautions: Fall Precaution Comments: wound vac L residual limb, R LE BKA Restrictions Weight Bearing Restrictions: Yes LLE Weight Bearing: Non weight bearing Other Position/Activity Restrictions: L limb protector re-ordered as it was lost from acute   Therapy/Group: Individual Therapy  Limmie Patricia, OTR/L,CBIS  Supplemental OT - [facility] and [facility] Secure Chat Preferred \  07/27/2023, 10:39 PM

## 2023-07-27 NOTE — Progress Notes (Signed)
PROGRESS NOTE   Subjective/Complaints:  Live interpreter in room Working with physical therapy.  Patient does not like wearing his limb protector.  We discussed not only the protective effect but also stretching the hamstrings to prevent contracture.  We discussed that prosthetic use requires full knee extension  ROS: Patient denies CP, SOB, N/V/D Objective:   No results found. No results for input(s): "WBC", "HGB", "HCT", "PLT" in the last 72 hours.  No results for input(s): "NA", "K", "CL", "CO2", "GLUCOSE", "BUN", "CREATININE", "CALCIUM" in the last 72 hours.   Intake/Output Summary (Last 24 hours) at 07/27/2023 1032 Last data filed at 07/26/2023 1900 Gross per 24 hour  Intake 720 ml  Output --  Net 720 ml        Physical Exam: Vital Signs Blood pressure 134/77, pulse 80, temperature (!) 97.3 F (36.3 C), temperature source Oral, resp. rate 18, height 5\' 1"  (1.549 m), SpO2 100%.   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 Pt is cooperative Skin: warm and dry Neuro:  Makes eye contact and interacts appropriately.  Interpreter present who assisted with interview.  Following simple commands. MSK: Left knee can extend to -5 from full extension  Assessment/Plan: 1. Functional deficits which require 3+ hours per day of interdisciplinary therapy in a comprehensive inpatient rehab setting. Physiatrist is providing close team supervision and 24 hour management  of active medical problems listed below. Physiatrist and rehab team continue to assess barriers to discharge/monitor patient progress toward functional and medical goals  Care Tool:  Bathing    Body parts bathed by patient: Right arm, Left arm, Chest, Abdomen, Front perineal area, Right upper leg, Left upper leg, Face   Body parts bathed by helper: Right lower leg, Buttocks Body parts n/a: Left lower leg   Bathing assist Assist Level: Moderate Assistance - Patient 50 - 74%     Upper Body Dressing/Undressing Upper body dressing   What is the patient wearing?: Pull over shirt    Upper body assist Assist Level: Contact Guard/Touching assist    Lower Body Dressing/Undressing Lower body dressing      What is the patient wearing?: Underwear/pull up, Pants, Ace wrap/stump shrinker     Lower body assist Assist for lower body dressing: Maximal Assistance - Patient 25 - 49%     Toileting Toileting    Toileting assist Assist for toileting: Moderate Assistance - Patient 50 - 74%     Transfers Chair/bed transfer  Transfers assist     Chair/bed transfer assist level: Minimal Assistance - Patient > 75%     Locomotion Ambulation   Ambulation assist      Assist level: Minimal Assistance - Patient > 75% Assistive device: Walker-rolling Max distance: 20   Walk 10 feet activity  Assist     Assist level: Minimal Assistance - Patient > 75% Assistive device: Walker-rolling   Walk 50 feet activity   Assist Walk 50 feet with 2 turns activity did not occur: Safety/medical concerns         Walk 150 feet activity   Assist Walk 150 feet activity did not occur: Safety/medical concerns         Walk 10 feet on uneven surface  activity   Assist Walk 10 feet on uneven surfaces activity did not occur: Safety/medical concerns         Wheelchair     Assist Is the patient using a wheelchair?: Yes Type of Wheelchair: Manual    Wheelchair assist level:  Contact Guard/Touching assist Max wheelchair distance: 150+    Wheelchair 50 feet with 2 turns activity    Assist        Assist Level: Minimal Assistance - Patient > 75%   Wheelchair 150 feet activity     Assist      Assist Level: Minimal Assistance - Patient > 75%   Blood pressure 134/77, pulse 80, temperature (!) 97.3 F (36.3 C), temperature source Oral, resp. rate 18, height 5\' 1"  (1.549 m), SpO2 100%.   Medical Problem List and Plan: 1. 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 Advised patient to check out and in at nursing station when using grounds pass    2. Impaired mobility: continue lovenox             -antiplatelet therapy: DAPT 3. Pain Management: oxycodone--Oxycontin 10 mg BID added (using oxy 15 TID in past 24 hours). as reports pain nagging and does not ask. Daughter has to advocate for him.              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 Mood/Behavior/Sleep:  LCSW to follow for evaluation and support.              -antipsychotic agents: N/A             --sleeping well except for pain 5. Neuropsych/cognition: This patient may not be fully capable of making decisions on his own behalf. 6. Skin/Wound Care:  Routine pressure relief measures. Continue wound VAC for 7-10 days.  Gr 1 decub right heel prevalon boot ordered, continue to monitor 07/28/2023 7. Fluids/Electrolytes/Nutrition: Monitor I/O. Family concerned about his recent wt  loss/poor intake             --ensure  max and beneprotein added to promote healing.              --continue Zinc, Juven and Vitamin C             --diet down graded to D2 (per family )due to edentulous state. (They are willing to further modify to puree if D2 still difficult to chew.) 8.  T2DM: Monitor BS ac/hs. Continue Metformin 500 mg bid and Jardiance 25 mg. Will ask daughter to bring in Trulicity.  --Continue   novolog 4 units TID meal coverage for now-->may need to d/c if BS drop with increase in activity.  -9/26 Will DC mealtime novolog to avoid hypoglycemia CBG (last 3)  Recent Labs    07/26/23 1659 07/26/23 2056 07/27/23 0606  GLUCAP 119* 125* 113*  Controlled 9/ 30  9. Anemia: Hgb has been trending down since July surgery. Wt loss and poor intake past 3 months. Will order anemia panel.   -9/27 Hgb was 9.8 yesterday, will add ferrous sulfate daily for iron deficiency 10. HTN: Monitor BP TID--continue Lisinopril and hydrochlorothiazide daily.   -9/26 BP soft, will discontinue hydrochlorothiazide  -9/27 monitor response to medication change, BP stable today    07/27/2023    3:06 AM 07/26/2023    7:12 PM 07/26/2023    4:11 PM  Vitals with BMI  Systolic 134 114 962  Diastolic 77 74 76  Pulse 80 92 85    11. Constipation: Will add Senna to colace.  --Dose of MOM today as had been straining without results.  -9/26 sorbitol 60ml ordered with BM  07/28/2023 improved after increase senna to 2 tab BID - Lg type 6 stool at 0231 and small type 1 @ 2021  12. PAD: On DAPT and Lipitor.    13. Screening for vitamin D deficiency: check vitamin D level tomor  14.  Hyponatremia.  Mild continue to monitor    LOS: 4 days A FACE TO FACE EVALUATION WAS PERFORMED  Erick Colace 07/27/2023, 10:32 AM

## 2023-07-27 NOTE — Plan of Care (Signed)
  Problem: Consults Goal: RH LIMB LOSS PATIENT EDUCATION Description: Description: See Patient Education module for eduction specifics. Outcome: Progressing   Problem: RH BOWEL ELIMINATION Goal: RH STG MANAGE BOWEL WITH ASSISTANCE Description: STG Manage Bowel with mod I Assistance. Outcome: Progressing Goal: RH STG MANAGE BOWEL W/MEDICATION W/ASSISTANCE Description: STG Manage Bowel with Medication with min Assistance. Outcome: Progressing   Problem: RH BLADDER ELIMINATION Goal: RH STG MANAGE BLADDER WITH ASSISTANCE Description: STG Manage Bladder With mod I Assistance Outcome: Progressing   Problem: RH SKIN INTEGRITY Goal: RH STG SKIN FREE OF INFECTION/BREAKDOWN Description: Incision will be free of infection/breakdown with min assist  Outcome: Progressing Goal: RH STG MAINTAIN SKIN INTEGRITY WITH ASSISTANCE Description: STG Maintain Skin Integrity With cueing Assistance. Outcome: Progressing Goal: RH STG ABLE TO PERFORM INCISION/WOUND CARE W/ASSISTANCE Description: STG Able To Perform Incision/Wound Care With min Assistance. Outcome: Progressing   Problem: RH SAFETY Goal: RH STG ADHERE TO SAFETY PRECAUTIONS W/ASSISTANCE/DEVICE Description: STG Adhere to Safety Precautions With cueing Assistance/Device. Outcome: Progressing Goal: RH STG DECREASED RISK OF FALL WITH ASSISTANCE Description: STG Decreased Risk of Fall With min Assistance. Outcome: Progressing   Problem: RH PAIN MANAGEMENT Goal: RH STG PAIN MANAGED AT OR BELOW PT'S PAIN GOAL Description: Less than 4 with PRN medications min assist  Outcome: Progressing   Problem: RH KNOWLEDGE DEFICIT LIMB LOSS Goal: RH STG INCREASE KNOWLEDGE OF SELF CARE AFTER LIMB LOSS Description: Patient/caregiver will be able to manage medications, dressing changes, and diet modifications to improve cholesterol levels and A1C of 6.8 from nursing educations and nursing handouts independently   Outcome: Progressing   Problem:  Education: Goal: Knowledge of the prescribed therapeutic regimen will improve Outcome: Progressing Goal: Ability to verbalize activity precautions or restrictions will improve Outcome: Progressing Goal: Understanding of discharge needs will improve Outcome: Progressing   Problem: Activity: Goal: Ability to perform//tolerate increased activity and mobilize with assistive devices will improve Outcome: Progressing   Problem: Clinical Measurements: Goal: Postoperative complications will be avoided or minimized Outcome: Progressing   Problem: Self-Care: Goal: Ability to meet self-care needs will improve Outcome: Progressing   Problem: Self-Concept: Goal: Ability to maintain and perform role responsibilities to the fullest extent possible will improve Outcome: Progressing   Problem: Pain Management: Goal: Pain level will decrease with appropriate interventions Outcome: Progressing   Problem: Skin Integrity: Goal: Demonstration of wound healing without infection will improve Outcome: Progressing   Problem: Education: Goal: Understanding of CV disease, CV risk reduction, and recovery process will improve Outcome: Progressing Goal: Individualized Educational Video(s) Outcome: Progressing   Problem: Activity: Goal: Ability to return to baseline activity level will improve Outcome: Progressing   Problem: Cardiovascular: Goal: Ability to achieve and maintain adequate cardiovascular perfusion will improve Outcome: Progressing Goal: Vascular access site(s) Level 0-1 will be maintained Outcome: Progressing

## 2023-07-27 NOTE — Progress Notes (Signed)
Left BKA with wound VAC, no drainage. Educated Son on monitoring RLE/heel. PRN oxy ir given at 0226. Patrick [name] A

## 2023-07-27 NOTE — Progress Notes (Signed)
Occupational Therapy Session Note  Patient Details  Name: Patrick [name] MRN: [id number] Date of Birth: [birth year]  Today's Date: 07/27/2023 OT Individual Time: 0910-1015 OT Individual Time Calculation (min): 65 min    Short Term Goals: Week 1:  OT Short Term Goal 1 (Week 1): STG=LTG's d/t LOS  Skilled Therapeutic Interventions/Progress Updates:    1:1 Pt received in the bed. PT able to perform squat pivot transfer from bed to w/c <>toilet with grab bar with contact guard with cues for safety. Pt participated in bathing and dressing at sink level with focus on management of w/c parts and safety with mobility. Pt's daughter present and expressed concerns about his safety with remembering and following directions safely. Pt propelled w/c with mod VC for sequence and efficiency - however reports it hurts his right shoulder - provided break after getting to the dayroom. Transferred to the mat with the RW - pt reported it is easier to do a squat pivot. Discussed continuing to work with the RW cause the w/c will not fit into the bathroom. Discussed the RW to be inside the doorway and pt could come up to the doorway with the chair to then get to the toilet/ shower etc. On the mat pt got into prone position with focusing on stretching hip flexors and allowing his knee to get straight and maintain. Then perform hip extension in prone and in sidelying.  Transitioned back to EOM and performed sit to stands without RW with contact guard to work on balance and ability to find balance and ability to maintain left hip and knee extension in standing. Assisted pt back to room and back into bed with contact guard. Left with daughter in room.    Daughter and interpreter present for session for translation.  Therapy Documentation Precautions:  Precautions Precautions: Fall Precaution Comments: wound vac L residual limb, R LE BKA Restrictions Weight Bearing Restrictions: Yes LLE Weight Bearing: Non weight  bearing Other Position/Activity Restrictions: L limb protector re-ordered as it was lost from acute  Pain: Pain Assessment Pain Score: 0-No pain   Therapy/Group: Individual Therapy  Roney Mans [facility] 07/27/2023, 12:55 PM

## 2023-07-28 LAB — GLUCOSE, CAPILLARY
Glucose-Capillary: 97 mg/dL (ref 70–99)
Glucose-Capillary: 125 mg/dL — ABNORMAL HIGH (ref 70–99)
Glucose-Capillary: 121 mg/dL — ABNORMAL HIGH (ref 70–99)
Glucose-Capillary: 105 mg/dL — ABNORMAL HIGH (ref 70–99)

## 2023-07-28 LAB — CBC
HCT: 30.6 % — ABNORMAL LOW (ref 39.0–52.0)
Hemoglobin: 9.8 g/dL — ABNORMAL LOW (ref 13.0–17.0)
MCH: 27.3 pg (ref 26.0–34.0)
MCHC: 32 g/dL (ref 30.0–36.0)
MCV: 85.2 fL (ref 80.0–100.0)
Platelets: 275 10*3/uL (ref 150–400)
RBC: 3.59 MIL/uL — ABNORMAL LOW (ref 4.22–5.81)
RDW: 14.8 % (ref 11.5–15.5)
WBC: 6.3 10*3/uL (ref 4.0–10.5)
nRBC: 0 % (ref 0.0–0.2)

## 2023-07-28 LAB — BASIC METABOLIC PANEL
Anion gap: 8 (ref 5–15)
BUN: 27 mg/dL — ABNORMAL HIGH (ref 8–23)
CO2: 21 mmol/L — ABNORMAL LOW (ref 22–32)
Calcium: 8.4 mg/dL — ABNORMAL LOW (ref 8.9–10.3)
Chloride: 103 mmol/L (ref 98–111)
Creatinine, Ser: 0.47 mg/dL — ABNORMAL LOW (ref 0.61–1.24)
GFR, Estimated: >60 mL/min (ref >60)
Glucose, Bld: 97 mg/dL (ref 70–99)
Potassium: 4.3 mmol/L (ref 3.5–5.1)
Sodium: 132 mmol/L — ABNORMAL LOW (ref 135–145)

## 2023-07-28 MED ORDER — SODIUM CHLORIDE 1 G PO TABS: 1.0000 g | ORAL_TABLET | Freq: Two times a day (BID) | ORAL | Status: DC

## 2023-07-28 MED ORDER — SODIUM CHLORIDE 1 G PO TABS
1.0000 g | ORAL_TABLET | Freq: Two times a day (BID) | ORAL | Status: DC
  Administered 2023-07-28 – 2023-07-30 (×5): 1 g via ORAL
  Filled 2023-07-28 (×5): qty 1

## 2023-07-28 NOTE — Discharge Instructions (Addendum)
Inpatient Rehab Discharge Instructions  Patrick [name] Discharge date and time:  08/02/23  Activities/Precautions/ Functional Status: Activity: no lifting, driving, or strenuous exercise  till cleared by MD Diet: diabetic diet Wound Care: Wash with soap and water, pat dry and keep wound clean and dry.No ointments, lotions or creams. Contact Dr. Lajoyce Corners if you develop any problems with your incision/wound--redness, swelling, increase in pain, drainage or if you develop fever or chills.    Functional status:  ___ No restrictions     ___ Walk up steps independently _X__ 24/7 supervision/assistance   ___ Walk up steps with assistance ___ Intermittent supervision/assistance  ___ Bathe/dress independently ___ Walk with walker     ___ Bathe/dress with assistance ___ Walk Independently    ___ Shower independently ___ Walk with assistance    _X__ Shower with assistance _X__ No alcohol     ___ Return to work/school ________   Special Instructions: Monitor blood sugars at home 2-3 times a day and follow up with primary MD for adjustment of medications.    COMMUNITY REFERRALS UPON DISCHARGE:    Home Exercise Program given to patient and family    Medical Equipment/Items Ordered:wheelchair, bedside commode and tub bench                                                 Agency/Supplier:[employer]   [phone]    My questions have been answered and I understand these instructions. I will adhere to these goals and the provided educational materials after my discharge from the hospital.  Patient/Caregiver Signature _______________________________ Date __________  Clinician Signature _______________________________________ Date __________  Please bring this form and your medication list with you to all your follow-up doctor's appointments.

## 2023-07-28 NOTE — Progress Notes (Signed)
PROGRESS NOTE   Subjective/Complaints:  Interpreter present today for assistance.  No new concerns this morning.  He reports pain is under control.  ROS: Patient denies fever, chills, CP, SOB, N/V/D, HA Objective:   No results found. Recent Labs    07/28/23 0651  WBC 6.3  HGB 9.8*  HCT 30.6*  PLT 275    Recent Labs    07/28/23 0651  NA 132*  K 4.3  CL 103  CO2 21*  GLUCOSE 97  BUN 27*  CREATININE 0.47*  CALCIUM 8.4*     Intake/Output Summary (Last 24 hours) at 07/28/2023 1013 Last data filed at 07/28/2023 0745 Gross per 24 hour  Intake 937 ml  Output --  Net 937 ml        Physical Exam: Vital Signs Blood pressure 120/78, pulse 84, temperature 98.2 F (36.8 C), temperature source Oral, resp. rate 17, height 5\' 1"  (1.549 m), SpO2 98%.   General: No acute distress Mood and affect are appropriate Heart: Regular rate and rhythm no rubs murmurs or extra sounds Lungs: Clear to auscultation, breathing unlabored, no rales or wheezes Abdomen: Positive bowel sounds, soft nontender to palpation, nondistended Extremities: No clubbing, cyanosis, or edema Skin: No evidence of breakdown, no evidence of rash, RIght heel boggy no breakdown , mild tenderness and erythema  Extremities:  Left BKA with wound VAC in place, limb protector Psych: Pt's affect is appropriate. Pt is cooperative Skin: warm and dry Neuro:  Makes eye contact and interacts appropriately.  Interpreter present who assisted with interview.  Following simple commands. MSK: Left knee can extend to -5 from full extension  Assessment/Plan: 1. Functional deficits which require 3+ hours per day of interdisciplinary therapy in a comprehensive inpatient rehab setting. Physiatrist is providing close team supervision and 24 hour management of active medical problems listed below. Physiatrist and rehab team continue to assess barriers to discharge/monitor  patient progress toward functional and medical goals  Care Tool:  Bathing    Body parts bathed by patient: Right arm, Left arm, Chest, Abdomen, Front perineal area, Right upper leg, Left upper leg, Face, Buttocks, Right lower leg   Body parts bathed by helper: Right lower leg, Buttocks Body parts n/a: Left lower leg   Bathing assist Assist Level: Minimal Assistance - Patient > 75%     Upper Body Dressing/Undressing Upper body dressing   What is the patient wearing?: Pull over shirt    Upper body assist Assist Level: Set up assist    Lower Body Dressing/Undressing Lower body dressing      What is the patient wearing?: Underwear/pull up, Pants, Ace wrap/stump shrinker     Lower body assist Assist for lower body dressing: Moderate Assistance - Patient 50 - 74%     Toileting Toileting    Toileting assist Assist for toileting: Contact Guard/Touching assist     Transfers Chair/bed transfer  Transfers assist     Chair/bed transfer assist level: Contact Guard/Touching assist     Locomotion Ambulation   Ambulation assist      Assist level: Minimal Assistance - Patient > 75% Assistive device: Walker-rolling Max distance: 20   Walk 10 feet activity  Assist     Assist level: Minimal Assistance - Patient > 75% Assistive device: Walker-rolling   Walk 50 feet activity   Assist Walk 50 feet with 2 turns activity did not occur: Safety/medical concerns         Walk 150 feet activity   Assist Walk 150 feet activity did not occur: Safety/medical concerns         Walk 10 feet on uneven surface  activity   Assist Walk 10 feet on uneven surfaces activity did not occur: Safety/medical concerns         Wheelchair     Assist Is the patient using a wheelchair?: Yes Type of Wheelchair: Manual    Wheelchair assist level: Contact Guard/Touching assist Max wheelchair distance: 150+    Wheelchair 50 feet with 2 turns  activity    Assist        Assist Level: Minimal Assistance - Patient > 75%   Wheelchair 150 feet activity     Assist      Assist Level: Minimal Assistance - Patient > 75%   Blood pressure 120/78, pulse 84, temperature 98.2 F (36.8 C), temperature source Oral, resp. rate 17, height 5\' 1"  (1.549 m), SpO2 98%.   Medical Problem List and Plan: 1. 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 Advised patient to check out and in at nursing station when using grounds pass    2. Impaired mobility: continue lovenox             -antiplatelet therapy: DAPT 3. Pain Management: oxycodone--Oxycontin 10 mg BID added (using oxy 15 TID in past 24 hours). as reports pain nagging and does not ask. Daughter has to advocate for him.              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 Mood/Behavior/Sleep:  LCSW to follow for evaluation and support.              -antipsychotic agents: N/A             --sleeping well except for pain 5. Neuropsych/cognition: This patient may not be fully capable of making decisions on his own behalf. 6. Skin/Wound Care:  Routine pressure relief measures. Continue wound VAC for 7-10 days.   -9/30 remove wound vac  Gr 1 decub right heel prevalon boot ordered, continue to monitor 07/28/2023 7. Fluids/Electrolytes/Nutrition: Monitor I/O. Family concerned about his recent wt  loss/poor intake             --ensure max and beneprotein added to promote healing.               --continue Zinc, Juven and Vitamin C             --diet down graded to D2 (per family )due to edentulous state. (They are willing to further  modify to puree if D2 still difficult to chew.) 8. T2DM: Monitor BS ac/hs. Continue Metformin 500 mg bid and Jardiance 25 mg. Will ask daughter to bring in Trulicity.  --Continue   novolog 4 units TID meal coverage for now-->may need to d/c if BS drop with increase in activity.  -9/26 Will DC mealtime novolog to avoid hypoglycemia 9/30 CBGs controlled, monitor  CBG (last 3)  Recent Labs    07/27/23 1632 07/27/23 2054 07/28/23 0542  GLUCAP 114* 154* 97   9. Anemia: Hgb has been trending down since July surgery. Wt loss and poor intake past 3 months. Will order anemia panel.   -9/27 Hgb was 9.8 yesterday, will add ferrous sulfate daily for iron deficiency 10. HTN: Monitor BP TID--continue Lisinopril and hydrochlorothiazide daily.   -9/26 BP soft, will discontinue hydrochlorothiazide  -9/30 BP controlled    07/28/2023    8:36 AM 07/28/2023    4:05 AM 07/27/2023    7:09 PM  Vitals with BMI  Systolic 120 128 147  Diastolic 78 74 74  Pulse 84 79 93    11. Constipation: Will add Senna to colace.  --Dose of MOM today as had been straining without results.  -9/26 sorbitol 60ml ordered with BM  07/27/2023 improved after increase senna to 2 tab BID - Lg type 6 stool at 0231 and small type 1 @ 2021 9/30 Improved after several BMs  12. PAD: On DAPT and Lipitor.    13. Screening for vitamin D deficiency: check vitamin D level tomor  14.  Hyponatremia.  Mild continue to monitor  -07/28/23 stable 132, start  BID salt tab    LOS: 5 days A FACE TO FACE EVALUATION WAS PERFORMED  Fanny Dance 07/28/2023, 10:13 AM

## 2023-07-28 NOTE — Plan of Care (Signed)
  Problem: RH Bed to Chair Transfers Goal: LTG Patient will perform bed/chair transfers w/assist (PT) Description: LTG: Patient will perform bed to chair transfers with assistance (PT). Flowsheets (Taken 07/28/2023 0741) LTG: Pt will perform Bed to Chair Transfers with assistance level: Independent with assistive device

## 2023-07-28 NOTE — Progress Notes (Signed)
Physical Therapy Session Note  Patient Details  Name: Patrick [name] MRN: [id number] Date of Birth: June 26, 1946  Today's Date: 07/28/2023 PT Individual Time: 5284-1324, 4010-2725 PT Individual Time Calculation (min): 59 min, 33 min   Short Term Goals: Week 1:  PT Short Term Goal 1 (Week 1): STG=LTG 2/2 ELOS  Skilled Therapeutic Interventions/Progress Updates:      Treatment Session 1  Pt supine in bed upon arrival. Pt agreeable to therapy. Pt denies any pain at rest but reports 5/10 pain in L anterior aspect of distal residual limb upon standing with limb protector on. (Premedicated, notified nursing). Pt reports 2/0 pain in R LE at end of session.   Pt requesting to use bathroom, initially trialed ambualtion from bed to bathroom, however pt unable to tolerate 2/2 pain. Pt performed stand pivot transfer bed to [facility] with supervision with power up and CGA/min A for pivot, pt demos decreased R LE clearance 2/2 pain.   Pt continent of bowel and bladder. Pt doffed pants while standing with CGA, pt performed pericare with set up assist while seated on BSC. Pt donned pants with RW and CGA, with intermittnet use of unilateral/bilateral UE support on RW. Stand pivot transfer BSC to bed, and bed to [facility] with RW and CGA, with emphasis on not sitting until R LE touching chair.   Pt hesitant to participate in remainder of session 2/2 L LE pain, but agreeable to exercise in WC.    Treatment session focused on endurance, B UE strengthening, independence with wheelchair mobility and community integration. Pt self propelled WC through elevator, and over unlevel surfaces outside x300 + feet with supervision/intermittent min A for obstacle negotiation for on unlevel surfaces, verbal and tactile cues provided for technique for entering elevator backwards, pt requires mod cues for entering backwards.   BP at end of session 119/73, HR 96. Pt seated in WC with seatbelt alarm on and needs within reach.    Treatment Session 2   Pt seated in WC upon arrival. Pt agreeable to therapy. Pt denies any pain. No in person translator present. Utilized ipad interpretor, required increased time 2/2 technical difficulties and communication barriers.   Pt ambulated ~15 feet with RW and CGA/min A, and performed ambulatory transfer to [facility],  verbal cues provided for technique and safety with RW especially with transfer to chair.   Pt performed mass practice of stand pivot transfer WC to bed with RW and min A progressing to close supervision, verbal cues provided sequencing and positioning of RW for safety for B UE support on arm rest versus L UE support on RW and R UE support on arm rest as pt demos improved stability with B UE support for power up. Pt demos improved technique.   Pt denies need to use bathroom. Pt requesting to return to bed. Sit to supine with mod I, doffed limb protector with supervision and increased time. Pt supine in bed with HOB elevated, bed alarm on and needs within reach and end of session.    Therapy Documentation Precautions:  Precautions Precautions: Fall Precaution Comments: wound vac L residual limb, R LE BKA Restrictions Weight Bearing Restrictions: Yes LLE Weight Bearing: Non weight bearing Other Position/Activity Restrictions: L limb protector re-ordered as it was lost from acute  Therapy/Group: Individual Therapy  [facility] [place], [place], DPT  07/28/2023, 7:42 AM

## 2023-07-28 NOTE — Progress Notes (Signed)
Occupational Therapy Session Note  Patient Details  Name: Patrick [name] MRN: [id number] Date of Birth: [birth year]  Today's Date: 07/28/2023 OT Individual Time: 1005-1100 OT Individual Time Calculation (min): 55 min    Short Term Goals: Week 1:  OT Short Term Goal 1 (Week 1): STG=LTG's d/t LOS  Skilled Therapeutic Interventions/Progress Updates:  Pt received sitting in [facility] for skilled OT session with focus on BADL participation. Pt agreeable to interventions, demonstrating overall pleasant mood. Pt reported 4/10 surgical pain, pre-medicated. OT offering intermediate rest breaks and positioning suggestions throughout session to address pain/fatigue and maximize participation/safety in session.   Pt and daughter educated on residual limb care including daily skin-checks and changing of shinker, both receptive. Pt completes full-body dressing/bathing, requiring setup A for UB, and CGA + unilateral support on bed-rail for LB care, cuing provided for orientation of undergarments. Pt re-educated on need to wear either hospital sock or shoe on R-foot for decreased fall risk, as patient was attempting to stand with regular sock.   Pt self-propels WC from room <> day room with assistance provided for wound vac management. In day room, pt and OT dedicate time to [facility] management with focus placed leg rest donning/doffing and placement in preparation for transfer. Pt performs squat-pivot transfer from WC<>EOM with CGA, at [facility] education provided on passive knee-stretch to prevent contractures. Back in patient room, pt and daughter provided with home-measurement sheet.   Pt remained sitting in [facility] with all immediate needs met at end of session. Pt continues to be appropriate for skilled OT intervention to promote further functional independence.   Therapy Documentation Precautions:  Precautions Precautions: Fall Precaution Comments: wound vac L residual limb, R LE BKA Restrictions Weight Bearing  Restrictions: Yes LLE Weight Bearing: Non weight bearing Other Position/Activity Restrictions: L limb protector re-ordered as it was lost from acute   Therapy/Group: Individual Therapy  Lou Cal, OTR/L, MSOT  07/28/2023, 6:01 AM

## 2023-07-28 NOTE — Progress Notes (Signed)
Occupational Therapy Session Note  Patient Details  Name: Patrick [name] MRN: [id number] Date of Birth: [birth year]  {CHL IP REHAB OT TIME CALCULATIONS:304400400}  {CHL IP REHAB OT TIME CALCULATIONS:304400400}  Short Term Goals: Week 1:  OT Short Term Goal 1 (Week 1): STG=LTG's d/t LOS  Skilled Therapeutic Interventions/Progress Updates:  Session 1: Pt received *** for skilled OT session with focus on ***. Pt agreeable to interventions, demonstrating overall *** mood. Pt reported ***/10 pain, stating "***" in reference to ***. OT offering intermediate rest breaks and positioning suggestions throughout session to address pain/fatigue and maximize participation/safety in session.    Pt remained *** with all immediate needs met at end of session. Pt continues to be appropriate for skilled OT intervention to promote further functional independence.   Session 2: Pt received *** for skilled OT session with focus on ***. Pt agreeable to interventions, demonstrating overall *** mood. Pt reported ***/10 pain, stating "***" in reference to ***. OT offering intermediate rest breaks and positioning suggestions throughout session to address pain/fatigue and maximize participation/safety in session.    Pt remained *** with all immediate needs met at end of session. Pt continues to be appropriate for skilled OT intervention to promote further functional independence.    Therapy Documentation Precautions:  Precautions Precautions: Fall Precaution Comments: wound vac L residual limb, R LE BKA Restrictions Weight Bearing Restrictions: Yes LLE Weight Bearing: Non weight bearing Other Position/Activity Restrictions: L limb protector re-ordered as it was lost from acute   Therapy/Group: Individual Therapy  Lou Cal, OTR/L, MSOT  07/28/2023, 10:15 PM

## 2023-07-29 LAB — GLUCOSE, CAPILLARY
Glucose-Capillary: 84 mg/dL (ref 70–99)
Glucose-Capillary: 103 mg/dL — ABNORMAL HIGH (ref 70–99)
Glucose-Capillary: 117 mg/dL — ABNORMAL HIGH (ref 70–99)
Glucose-Capillary: 171 mg/dL — ABNORMAL HIGH (ref 70–99)

## 2023-07-29 MED ORDER — OXYCODONE HCL 5 MG PO TABS
5.0000 mg | ORAL_TABLET | Freq: Every day | ORAL | Status: DC
  Administered 2023-07-30 – 2023-08-01 (×3): 5 mg via ORAL
  Filled 2023-07-29 (×4): qty 1

## 2023-07-29 MED ORDER — ACETAMINOPHEN 325 MG PO TABS
650.0000 mg | ORAL_TABLET | Freq: Three times a day (TID) | ORAL | Status: DC
  Administered 2023-07-29 – 2023-08-03 (×20): 650 mg via ORAL
  Filled 2023-07-29 (×20): qty 2

## 2023-07-29 MED ORDER — OXYCODONE HCL 5 MG PO TABS: 5.0000 mg | ORAL_TABLET | Freq: Every day | ORAL | Status: DC

## 2023-07-29 NOTE — Progress Notes (Signed)
Patient had a quite night. Affect flat. Refused CBG check this am. Morning staff to be informed. Safety maintained.

## 2023-07-29 NOTE — Plan of Care (Signed)
  Problem: RH Dressing Goal: LTG Patient will perform lower body dressing w/assist (OT) Description: LTG: Patient will perform lower body dressing with assist, with/without cues in positioning using equipment (OT) 07/29/2023 1430 by Isabella Stalling, OT Note: Downgraded 10/1 due to cognitive implications including decreased memory and impulsivity.  07/29/2023 1426 by Isabella Stalling, OT Flowsheets (Taken 07/29/2023 1426) LTG: Pt will perform lower body dressing with assistance level of: Supervision/Verbal cueing   Problem: RH Toileting Goal: LTG Patient will perform toileting task (3/3 steps) with assistance level (OT) Description: LTG: Patient will perform toileting task (3/3 steps) with assistance level (OT)  Flowsheets (Taken 07/29/2023 1430) LTG: Pt will perform toileting task (3/3 steps) with assistance level: Supervision/Verbal cueing Note: Downgraded 10/1 due to cognitive implications including decreased memory and impulsivity.

## 2023-07-29 NOTE — Plan of Care (Signed)
Downgraded goals to supervision 2/2 pt requiring cuing for sequencing and recall of steps for safety.  Problem: RH Balance Goal: LTG Patient will maintain dynamic standing balance (PT) Description: LTG:  Patient will maintain dynamic standing balance with assistance during mobility activities (PT) 07/29/2023 1237 by Ambrose Finland, PT Flowsheets (Taken 07/29/2023 1237) LTG: Pt will maintain dynamic standing balance during mobility activities with:: Supervision/Verbal cueing 07/29/2023 1236 by Ambrose Finland, PT Flowsheets (Taken 07/29/2023 1236) LTG: Pt will maintain dynamic standing balance during mobility activities with:: Supervision/Verbal cueing   Problem: Sit to Stand Goal: LTG:  Patient will perform sit to stand with assistance level (PT) Description: LTG:  Patient will perform sit to stand with assistance level (PT) Flowsheets (Taken 07/29/2023 1236) LTG: PT will perform sit to stand in preparation for functional mobility with assistance level: Supervision/Verbal cuein   Problem: RH Ambulation Goal: LTG Patient will ambulate in controlled environment (PT) Description: LTG: Patient will ambulate in a controlled environment, # of feet with assistance (PT). Flowsheets (Taken 07/29/2023 1236) LTG: Pt will ambulate in controlled environ  assist needed:: Supervision/Verbal cueing   Problem: RH Bed to Chair Transfers Goal: LTG Patient will perform bed/chair transfers w/assist (PT) Description: LTG: Patient will perform bed to chair transfers with assistance (PT). Flowsheets (Taken 07/29/2023 1236) LTG: Pt will perform Bed to Chair Transfers with assistance level: Supervision/Verbal cueing

## 2023-07-29 NOTE — Progress Notes (Signed)
Occupational Therapy Weekly Progress Note  Patient Details  Name: Patrick [name] MRN: [id number] Date of Birth: 1946/04/21  Beginning of progress report period: July 24, 2023 End of progress report period: July 30, 2023  {CHL IP REHAB OT TIME CALCULATIONS:304400400}   Patient has met {number 1-5:22450} of {number 1-5:20334} short term goals.  ***  Patient continues to demonstrate the following deficits: {impairments:3041632} and therefore will continue to benefit from skilled OT intervention to enhance overall performance with {ADL/iADL:3041649}.  Patient {LTG progression:3041653}.  {plan of HQIO:9629528}  OT Short Term Goals {OT UXL:2440102}  Skilled Therapeutic Interventions/Progress Updates:  Pt received *** for skilled OT session with focus on ***. Pt agreeable to interventions, demonstrating overall *** mood. Pt reported ***/10 pain, stating "***" in reference to ***. OT offering intermediate rest breaks and positioning suggestions throughout session to address pain/fatigue and maximize participation/safety in session.    Pt remained *** with all immediate needs met at end of session. Pt continues to be appropriate for skilled OT intervention to promote further functional independence.    Therapy Documentation Precautions:  Precautions Precautions: Fall Precaution Comments: wound vac L residual limb, R LE BKA Restrictions Weight Bearing Restrictions: Yes LLE Weight Bearing: Non weight bearing Other Position/Activity Restrictions: L limb protector re-ordered as it was lost from acute  Therapy/Group: Individual Therapy  Lou Cal, OTR/L, MSOT  07/29/2023, 7:12 PM

## 2023-07-29 NOTE — Progress Notes (Signed)
PROGRESS NOTE   Subjective/Complaints:  No acute events overnight.  Patient reports he has been having worse pain in the posterior portion of his residual limb for about 2 or 3 days.  He received a dose of oxycodone shortly before I spoke with him.  He reports his medicine has been helping previously.  Nursing/therapy reports he does not always remember to ask for it, pain has been limiting therapy this morning.  Wound VAC has been removed.  Last BM 9/30  ROS: Patient denies fever, chills, CP, SOB, N/V/D, HA + Residual limb pain Constipation is improved  Objective:   No results found. Recent Labs    07/28/23 0651  WBC 6.3  HGB 9.8*  HCT 30.6*  PLT 275    Recent Labs    07/28/23 0651  NA 132*  K 4.3  CL 103  CO2 21*  GLUCOSE 97  BUN 27*  CREATININE 0.47*  CALCIUM 8.4*     Intake/Output Summary (Last 24 hours) at 07/29/2023 1340 Last data filed at 07/29/2023 1003 Gross per 24 hour  Intake 477 ml  Output 1425 ml  Net -948 ml        Physical Exam: Vital Signs Blood pressure 118/77, pulse 79, temperature 98.5 F (36.9 C), temperature source Oral, resp. rate 17, height 5\' 1"  (1.549 m), SpO2 99%.   General: No acute distress Mood and affect are appropriate Heart: Regular rate and rhythm no rubs murmurs or extra sounds Lungs: Clear to auscultation, breathing unlabored, no rales or wheezes Abdomen: Positive bowel sounds, soft nontender to palpation, nondistended Extremities: No clubbing, cyanosis, or edema Skin: No evidence of breakdown, no evidence of rash, RIght heel boggy no breakdown , mild tenderness and erythema  Extremities: Left BKA with wound VAC removed, limb protector Psych: Pt's affect is appropriate. Pt is cooperative Skin: warm and dry Neuro:  Makes eye contact and interacts appropriately.  Interpreter present who assisted with interview.  Following simple commands. MSK: Left knee can  extend to -5 from full extension  Assessment/Plan: 1. Functional deficits which require 3+ hours per day of interdisciplinary therapy in a comprehensive inpatient rehab setting. Physiatrist is providing close team supervision and 24 hour management of active medical problems listed below. Physiatrist and rehab team continue to assess barriers to discharge/monitor patient progress toward functional and medical goals  Care Tool:  Bathing    Body parts bathed by patient: Right arm, Left arm, Chest, Abdomen, Front perineal area, Right upper leg, Left upper leg, Face, Buttocks, Right lower leg   Body parts bathed by helper: Right lower leg, Buttocks Body parts n/a: Left lower leg   Bathing assist Assist Level: Minimal Assistance - Patient > 75%     Upper Body Dressing/Undressing Upper body dressing   What is the patient wearing?: Pull over shirt    Upper body assist Assist Level: Set up assist    Lower Body Dressing/Undressing Lower body dressing      What is the patient wearing?: Underwear/pull up, Pants, Ace wrap/stump shrinker     Lower body assist Assist for lower body dressing: Moderate Assistance - Patient 50 - 74%     Toileting Toileting  Toileting assist Assist for toileting: Contact Guard/Touching assist     Transfers Chair/bed transfer  Transfers assist     Chair/bed transfer assist level: Supervision/Verbal cueing     Locomotion Ambulation   Ambulation assist      Assist level: Minimal Assistance - Patient > 75% Assistive device: Walker-rolling Max distance: 63   Walk 10 feet activity   Assist     Assist level: Minimal Assistance - Patient > 75% Assistive device: Walker-rolling   Walk 50 feet activity   Assist Walk 50 feet with 2 turns activity did not occur: Safety/medical concerns  Assist level: Minimal Assistance - Patient > 75% Assistive device: Walker-rolling    Walk 150 feet activity   Assist Walk 150 feet activity did  not occur: Safety/medical concerns         Walk 10 feet on uneven surface  activity   Assist Walk 10 feet on uneven surfaces activity did not occur: Safety/medical concerns         Wheelchair     Assist Is the patient using a wheelchair?: Yes Type of Wheelchair: Manual    Wheelchair assist level: Supervision/Verbal cueing Max wheelchair distance: 300+    Wheelchair 50 feet with 2 turns activity    Assist        Assist Level: Supervision/Verbal cueing   Wheelchair 150 feet activity     Assist      Assist Level: Supervision/Verbal cueing   Blood pressure 118/77, pulse 79, temperature 98.5 F (36.9 C), temperature source Oral, resp. rate 17, height 5\' 1"  (1.549 m), SpO2 99%.   Medical Problem List and Plan: 1. 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 Advised patient to check out and in at nursing station when using grounds pass    2. Impaired mobility: continue lovenox             -antiplatelet therapy: DAPT 3. Pain Management: oxycodone--Oxycontin 10 mg BID added (using oxy 15 TID in past 24 hours). as reports pain nagging and does not ask. Daughter has to advocate for him.              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 Mood/Behavior/Sleep:  LCSW to follow for evaluation and support.              -antipsychotic agents: N/A             --sleeping well except for pain 5.  Neuropsych/cognition: This patient may not be fully capable of making decisions on his own behalf. 6. Skin/Wound Care:  Routine pressure relief measures. Continue wound VAC for 7-10 days.   -10/1 wound VAC has been removed, monitor incision Gr 1 decub right heel prevalon boot ordered, continue to monitor 07/28/2023 7. Fluids/Electrolytes/Nutrition:  Monitor I/O. Family concerned about his recent wt  loss/poor intake             --ensure max and beneprotein added to promote healing.              --continue Zinc, Juven and Vitamin C             --diet down graded to D2 (per family )due to edentulous state. (They are willing to further modify to puree if D2 still difficult to chew.) 8. T2DM: Monitor BS ac/hs. Continue Metformin 500 mg bid and Jardiance 25 mg. Will ask daughter to bring in Trulicity.  --Continue   novolog 4 units TID meal coverage for now-->may need to d/c if BS drop with increase in activity.  -9/26 Will DC mealtime novolog to avoid hypoglycemia 10/1 CBGs controlled, continue current regimen CBG (last 3)  Recent Labs    07/28/23 2047 07/29/23 0602 07/29/23 1200  GLUCAP 105* 84 103*   9. Anemia: Hgb has been trending down since July surgery. Wt loss and poor intake past 3 months. Will order anemia panel.   -9/27 Hgb was 9.8 yesterday, will add ferrous sulfate daily for iron deficiency 10. HTN: Monitor BP TID--continue Lisinopril and hydrochlorothiazide daily.   -9/26 BP soft, will discontinue hydrochlorothiazide  -10/1 BP well-controlled    07/29/2023    1:03 PM 07/29/2023    5:22 AM 07/28/2023    7:57 PM  Vitals with BMI  Systolic 118 135 161  Diastolic 77 80 73  Pulse 79 74 81    11. Constipation: Will add Senna to colace.  --Dose of MOM today as had been straining without results.  -9/26 sorbitol 60ml ordered with BM  07/27/2023 improved after increase senna to 2 tab BID - Lg type 6 stool at 0231 and small type 1 @ 2021 10/1 last bowel movement yesterday  improved  12. PAD: On DAPT and Lipitor.    13. Screening for vitamin D deficiency: check vitamin D level tomor  14.  Hyponatremia.  Mild continue to monitor  -07/28/23 stable 132, start  BID salt tab   -Recheck labs tomorrow  15.  Azotemia.  -Creatinine stable however BUN elevated at 27, encourage increased fluid intake to more moderate level,  will recheck tomorrow  LOS: 6 days A FACE TO FACE EVALUATION WAS PERFORMED  Fanny Dance 07/29/2023, 1:40 PM

## 2023-07-29 NOTE — Progress Notes (Signed)
Physical Therapy Session Note  Patient Details  Name: Patrick [name] MRN: [id number] Date of Birth: 02-05-1946  Today's Date: 07/29/2023 PT Individual Time: 2725-3664, 4034-7425 PT Individual Time Calculation (min): 47 min, 55 min   Short Term Goals: Week 1:  PT Short Term Goal 1 (Week 1): STG=LTG 2/2 ELOS  Skilled Therapeutic Interventions/Progress Updates:      Treatment Session 1  Pt supine in bed upon arrival with nursing in room. Pt agreeable to therapy. Pt reports 4/10 pain in distal residual limb at site of incision. Wound vac removed on 9/30 after therapy sessions. Therapist doffed shrinker with use of saline as shrinker adhered to staples. Notified RN and charge nurse as medial aspect of L residual limb has skin flap (see picture from charge nurse). Notified MD during morning rounds.   Interpretor present throughout session to translate.    Education to pt and family as therapist trialed stair navigation with shower chair technqiue with use of transfer tub bench (recommended by OT for showering) versus shower chair to reduce the amount of equipment pt is payying for out of pocket. Plan to provide handout for purchasing shower chair from [employer] and/or [employer] discount medical supply store.   Discussed home set up, daughter reports hallway from living room to bathroom and bed room are too narrow for WC, but able to accommodate RW. Pt daughter plans to measure the hallway and doorway to determine accessibility of RW and WC. Recommended also measuring the distance from bedroom to bathroom and bathroom to bedroom to see if patient can ambulate that distance in the event WC is not accessible.   Pt donned shrinker and limb protector with supervision/ min A, verbal cues provided for technique. Pt performed sit to stand with supervision, verbal cues provided for UE positioning. Pt performed stand pivot transfer bed to [facility] with CGA on first trail, progressing to supervision on 2nd trial  with therapy demonstration and verbal cues for technique and sequencing.   Pt ambulated 22 feet with RW and hop to gait, with CGA/supervision prior to requiring seated rest break. Pt demos improved technique with therapist demonstration and cues for tricep extension and increased LE clearance.   Pt seated in WC at end of session with all needs within reach and seatbelt alarm on.    Treatment Session 2   Pt supine in bed upon arrival. Pt agreeable to therapy. Pt reports 5/10 pain distal residual limb, however did not interfere with therapy. Translator present throughout session.   Supine to sit independently. Pt ambulated from bed to toilet with RW and close supervision and CGA over threshold , and performed ambulatory transfer to toilet with min A, verbal cues provided for UE positioning.   Pt performed sit to stand from toilet with RW and CGA/close supervision, verbal cues provided for sequencing as pt trying to pull up pants while mid way between standing. Pt donned pants with CGA for stability.   Pt stood and washed hands with CGA, verbal cues provided getting R LE closer to sink versus severe anterior weight shift, pt able to stand and wash hands with supervision once R LE in better position, pt required verbal cues for grabbing soap and paper towels.   Pt ambulated x 38 feet with RW and hop to gait with supervision, pt demos improved technique with minimal cues. Verbal cues provided to relax L LE (place into neutral hip extension as pt demos tendency to flex hip). Pt able to correct with demonstation and mod cues  throughout session.   Pt performed stand pivot transfer WC to shower chair on stairs with CGA 2/2 tight space, pt ascended/descended 4 steps with shower chair technique and supervision.   Pt picked up cone x3 from floor with RW and reacher and CGA for stability with reduced UE support, verbal cues provided for technique with use of reacher.    Handout provided to daughter for  need to purchase reacher and shower chair. Education provided to purchase on [place] or [employer] supply store in [place].   Pt supine in bed at end of session with needs within reach and bed alarm on with daughter in room.        Therapy Documentation Precautions:  Precautions Precautions: Fall Precaution Comments: wound vac L residual limb, R LE BKA Restrictions Weight Bearing Restrictions: Yes LLE Weight Bearing: Non weight bearing Other Position/Activity Restrictions: L limb protector re-ordered as it was lost from acute  Therapy/Group: Individual Therapy  [facility] Ambrose Finland, [place], DPT  07/29/2023, 7:41 AM

## 2023-07-30 LAB — BASIC METABOLIC PANEL
Anion gap: 6 (ref 5–15)
BUN: 26 mg/dL — ABNORMAL HIGH (ref 8–23)
CO2: 25 mmol/L (ref 22–32)
Calcium: 8.6 mg/dL — ABNORMAL LOW (ref 8.9–10.3)
Chloride: 102 mmol/L (ref 98–111)
Creatinine, Ser: 0.56 mg/dL — ABNORMAL LOW (ref 0.61–1.24)
GFR, Estimated: >60 mL/min (ref >60)
Glucose, Bld: 108 mg/dL — ABNORMAL HIGH (ref 70–99)
Potassium: 4.1 mmol/L (ref 3.5–5.1)
Sodium: 133 mmol/L — ABNORMAL LOW (ref 135–145)

## 2023-07-30 LAB — GLUCOSE, CAPILLARY
Glucose-Capillary: 156 mg/dL — ABNORMAL HIGH (ref 70–99)
Glucose-Capillary: 91 mg/dL (ref 70–99)
Glucose-Capillary: 132 mg/dL — ABNORMAL HIGH (ref 70–99)
Glucose-Capillary: 115 mg/dL — ABNORMAL HIGH (ref 70–99)

## 2023-07-30 MED ORDER — SORBITOL 70 % SOLN
30.0000 mL | Freq: Every day | Status: DC | PRN
  Administered 2023-07-30 – 2023-07-31 (×2): 30 mL via ORAL
  Filled 2023-07-30 (×2): qty 30

## 2023-07-30 MED ORDER — SODIUM CHLORIDE 1 G PO TABS
1.0000 g | ORAL_TABLET | Freq: Three times a day (TID) | ORAL | Status: DC
  Administered 2023-07-30 – 2023-08-03 (×12): 1 g via ORAL
  Filled 2023-07-30 (×12): qty 1

## 2023-07-30 MED ORDER — POLYETHYLENE GLYCOL 3350 17 G PO PACK
17.0000 g | PACK | Freq: Every day | ORAL | Status: DC
  Administered 2023-07-30 – 2023-07-31 (×2): 17 g via ORAL
  Filled 2023-07-30 (×2): qty 1

## 2023-07-30 NOTE — Progress Notes (Signed)
Patient ID: Patrick [name], male   DOB: 04-04-1946, 77 y.o.   MRN: [id number]  Met with pt, wife and granddaughter who interpreted regarding team conference update of goals of mod/I-supervision and target discharge date of 10/5. All very pleased with this and are aware Maria-daughter to be here on Friday to go through education in preparation for discharge Sat. Will work on equipment for pt.

## 2023-07-30 NOTE — Progress Notes (Signed)
Physical Therapy Session Note  Patient Details  Name: Patrick [name] MRN: [id number] Date of Birth: Oct 21, 1946  Today's Date: 07/30/2023 PT Individual Time: 0800-0913 PT Individual Time Calculation (min): 73 min   Short Term Goals: Week 1:  PT Short Term Goal 1 (Week 1): STG=LTG 2/2 ELOS  Skilled Therapeutic Interventions/Progress Updates:      Pt supine in bed upon arrival. Pt agreeable to therapy. Pt reports 5/10 "tingling pain" at distal residual limb, however does not interfere with therapy (premedicated). Pt reports discomfort on thigh from limb protector, however improved with verbal cues to loosen straps and with placement of pant leg between skin and limb protector.  Notified scott from [employer].   Pt pulled up shrinker for proper fit (as it adjusted at night), with supervision,  max verbal cue provided for technique. Education provided to pt and family to ensure shrinker is flat on surface of residual limb versus bunched up. Therapist requested Scott from [employer] to bring 2x shrinker now that wound vac has been removed. Pt donned limb protector with supervision.   Pt ambulated from bed to bathroom and bathroom to [facility] with RW and supervision, verbal cues provided for positioning of L LE to reduce risk of hip flexion contracture.  pt doffed and donned pants with CGA, verbal cues provided to stand up tall prior to attempting to pull up pants.   Discussed stairs with pt daughter and wife, wife has picture of stairs and it has handrail at the top of the landing. Utilized shower chair technique on hospital stair case to better simulate home stair set up. Pt ascended descended 2x5 steps with shower chair technique with therapist standing in front of pt, and pt daughter moving showerchair, verbal cues provided to pt for technique and UE positioning for pt and caregiver safety, pt requiring min-mod A on first trial, progressing to min A on 2nd trial. Began family training, with pt  ascending/descending 3 steps with shower chair technique with daughter standing in front of pt and therapist managing shower chair. Pt daughter returned demonstration. Plan to continue family training next session with daughter assisting pt in front and pt wife managing shower chair.   Pt performed stand pivot transfer with supervision, verbal cues provided to slow down and for sequencing.   Pt supine in bed at end of session with all needs within reach and bed alarm on.   Therapy Documentation Precautions:  Precautions Precautions: Fall Precaution Comments: wound vac L residual limb, R LE BKA Restrictions Weight Bearing Restrictions: Yes LLE Weight Bearing: Non weight bearing Other Position/Activity Restrictions: L limb protector re-ordered as it was lost from acute  Therapy/Group: Individual Therapy  [facility] Ambrose Finland, [place], DPT  07/30/2023, 7:31 AM

## 2023-07-30 NOTE — Progress Notes (Addendum)
PROGRESS NOTE   Subjective/Complaints:  No new concerns this AM. Continue to report pain at posterior residual limb, controlled with oxycodone. Denies frequent issues with phantom pain a this time.   Last BM 9/30  ROS: Patient denies fever, chills, CP, SOB, N/V/D, HA + Residual limb pain + Constipation   Objective:   No results found. Recent Labs    07/28/23 0651  WBC 6.3  HGB 9.8*  HCT 30.6*  PLT 275    Recent Labs    07/28/23 0651 07/30/23 0511  NA 132* 133*  K 4.3 4.1  CL 103 102  CO2 21* 25  GLUCOSE 97 108*  BUN 27* 26*  CREATININE 0.47* 0.56*  CALCIUM 8.4* 8.6*     Intake/Output Summary (Last 24 hours) at 07/30/2023 1054 Last data filed at 07/30/2023 0800 Gross per 24 hour  Intake 480 ml  Output --  Net 480 ml        Physical Exam: Vital Signs Blood pressure 138/84, pulse 78, temperature 98.6 F (37 C), temperature source Oral, resp. rate 18, height 5\' 1"  (1.549 m), SpO2 100%.   General: No acute distress Mood and affect are appropriate Heart: Regular rate and rhythm no rubs murmurs or extra sounds Lungs: Clear to auscultation, breathing unlabored, no rales or wheezes Abdomen: Positive bowel sounds, soft nontender to palpation, nondistended Extremities: No clubbing, cyanosis, or edema Skin: No evidence of breakdown, no evidence of rash, RIght heel boggy  Extremities: Left BKA with surgical- incision with staples in place, limb protector No significant tenderness to palpation residual limb L BKA Psych: Pt's affect is appropriate. Pt is cooperative Skin: warm and dry Neuro:  Makes eye contact and interacts appropriately. Interpreter present who assisted with interview. Following simple commands.  MSK: Left knee can extend to -5 from full extension        Assessment/Plan: 1. Functional deficits which require 3+ hours per day of interdisciplinary therapy in a comprehensive inpatient  rehab setting. Physiatrist is providing close team supervision and 24 hour management of active medical problems listed below. Physiatrist and rehab team continue to assess barriers to discharge/monitor patient progress toward functional and medical goals  Care Tool:  Bathing    Body parts bathed by patient: Right arm, Left arm, Chest, Abdomen, Front perineal area, Right upper leg, Left upper leg, Face, Buttocks, Right lower leg   Body parts bathed by helper: Right lower leg, Buttocks Body parts n/a: Left lower leg   Bathing assist Assist Level: Minimal Assistance - Patient > 75%     Upper Body Dressing/Undressing Upper body dressing   What is the patient wearing?: Pull over shirt    Upper body assist Assist Level: Set up assist    Lower Body Dressing/Undressing Lower body dressing      What is the patient wearing?: Underwear/pull up, Pants, Ace wrap/stump shrinker     Lower body assist Assist for lower body dressing: Moderate Assistance - Patient 50 - 74%     Toileting Toileting    Toileting assist Assist for toileting: Contact Guard/Touching assist     Transfers Chair/bed transfer  Transfers assist     Chair/bed transfer assist level: Supervision/Verbal cueing  Locomotion Ambulation   Ambulation assist      Assist level: Minimal Assistance - Patient > 75% Assistive device: Walker-rolling Max distance: 63   Walk 10 feet activity   Assist     Assist level: Minimal Assistance - Patient > 75% Assistive device: Walker-rolling   Walk 50 feet activity   Assist Walk 50 feet with 2 turns activity did not occur: Safety/medical concerns  Assist level: Minimal Assistance - Patient > 75% Assistive device: Walker-rolling    Walk 150 feet activity   Assist Walk 150 feet activity did not occur: Safety/medical concerns         Walk 10 feet on uneven surface  activity   Assist Walk 10 feet on uneven surfaces activity did not occur:  Safety/medical concerns         Wheelchair     Assist Is the patient using a wheelchair?: Yes Type of Wheelchair: Manual    Wheelchair assist level: Supervision/Verbal cueing Max wheelchair distance: 300+    Wheelchair 50 feet with 2 turns activity    Assist        Assist Level: Supervision/Verbal cueing   Wheelchair 150 feet activity     Assist      Assist Level: Supervision/Verbal cueing   Blood pressure 138/84, pulse 78, temperature 98.6 F (37 C), temperature source Oral, resp. rate 18, height 5\' 1"  (1.549 m), SpO2 100%.   Medical Problem List and Plan: 1. 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 Advised patient to check out and in at nursing station when using grounds pass   -Team conference today please see physician documentation under team conference tab, met with team  to discuss problems,progress, and goals. Formulized individual treatment plan based on medical history, underlying problem and comorbidities.     2. Impaired mobility: continue lovenox             -antiplatelet therapy: DAPT 3. Pain Management: oxycodone--Oxycontin 10 mg BID added (using oxy 15 TID in past 24 hours). as reports pain nagging and does not ask. Daughter has to advocate for him.              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 Mood/Behavior/Sleep:  LCSW to follow for evaluation and support.              -antipsychotic agents: N/A             --sleeping well except for pain 5. Neuropsych/cognition: This patient may not be fully capable of making decisions on his own behalf. 6. Skin/Wound Care:  Routine pressure relief measures. Continue wound VAC for 7-10 days.   -  10/1 wound VAC has been removed, monitor incision Gr 1 decub right heel prevalon boot ordered, continue to monitor 07/28/2023 7. Fluids/Electrolytes/Nutrition: Monitor I/O. Family concerned about his recent wt  loss/poor intake             --ensure max and beneprotein added to promote healing.              --continue Zinc, Juven and Vitamin C             --diet down graded to D2 (per family )due to edentulous state. (They are willing to further modify to puree if D2 still difficult to chew.) 8. T2DM: Monitor BS ac/hs. Continue Metformin 500 mg bid and Jardiance 25 mg. Will ask daughter to bring in Trulicity.  --Continue   novolog 4 units TID meal coverage for now-->may need to d/c if BS drop with increase in activity.  -9/26 Will DC mealtime novolog to avoid hypoglycemia 10/2 CBGs well controlled CBG (last 3)  Recent Labs    07/29/23 1603 07/29/23 2047 07/30/23 0626  GLUCAP 117* 171* 156*   9. Anemia: Hgb has been trending down since July surgery. Wt loss and poor intake past 3 months. Will order anemia panel.   -9/27 Hgb was 9.8 yesterday, will add ferrous sulfate daily for iron deficiency 10. HTN: Monitor BP TID--continue Lisinopril and hydrochlorothiazide daily.   -9/26 BP soft, will discontinue hydrochlorothiazide  -10/2 well controlled, continue to monitor     07/30/2023    4:53 AM 07/29/2023    8:46 PM 07/29/2023    1:03 PM  Vitals with BMI  Systolic 138 108 161  Diastolic 84 74 77  Pulse 78 81 79    11. Constipation: Will add Senna to colace.  --Dose of MOM today as had been straining without  results.  -9/26 sorbitol 60ml ordered with BM  07/27/2023 improved after increase senna to 2 tab BID - Lg type 6 stool at 0231 and small type 1 @ 2021 10/2 LBM was on 9/30, Start miralax daily, start sorbitol 30ml prn  12. PAD: On DAPT and Lipitor.    13. Screening for vitamin D deficiency: check vitamin D level tomor  14.  Hyponatremia.  Mild continue to monitor  -10/2 NA up to 133, Increase salt tabs to TID  15.  Prerenal Azotemia.  -Cr/BUN 26/0.56, continue to encourage increased fluid intake   LOS: 7 days A FACE TO FACE EVALUATION WAS PERFORMED  Fanny Dance 07/30/2023, 10:54 AM

## 2023-07-30 NOTE — Progress Notes (Signed)
Occupational Therapy Session Note  Patient Details  Name: Patrick [name] MRN: [id number] Date of Birth: May 04, 1946  Today's Date: 07/30/2023 OT Individual Time: 1345-1444 OT Individual Time Calculation (min): 59 min    Short Term Goals: Week 1:  OT Short Term Goal 1 (Week 1): STG=LTG's d/t LOS  Skilled Therapeutic Interventions/Progress Updates:     Pt received siting up in bed presenting to be in good spirits receptive to skilled OT session reporting 0/10 pain- OT offering intermittent rest breaks, repositioning, and therapeutic support to optimize participation in therapy session. Pt not wearing limb guard upon OT arrival with education provided on purpose and risk of Pt hitting limb on bed when sleeping with Pt receptive to education. Pt's wife and grand DTR and interpreter present and involved throughout session.   Pt requesting to use BR. Transitioned to EOB supervision and donned limb guard and shoe with min A. Sit>stand using RW close supervision. Pt completed step-hops using RW to BR with CGA. Pt able to complete 3/3 toileting steps on standard toilet using RW for balance with close supervision this session. Pt completed step hops into room using RW close supervision and maintained standing balance with CGA while washing hands at sink.   Engaged pt in propelling his wc <> therapy spaces this session for endurance training and UB strengthening to increase activity tolerance for BADLs. Pt able to propel wc >300 ft this session with min verbal cues required for safety.   Provided education and demonstration of TTB transfer using RW with emphasis on safety considerations, technique, and skin breakdown prevention with Pt/family receptive to education. Educated on grab bar options/placement, fall prevention in shower setting, and [facility] use with demonstration provided on completing lateral leans to wash bottom. Pt then completed ambulatory transfer using RW with CGA for balance and safety  demonstrating teach back as evidence of learning.   Engaged Pt in completing seated UB exercises EOM using red therband to increase UB strength and endurance for BADLs and functional transfers. Provided Pt with HEP handout in spanish with written directions and pictures with pt able to complete the following exercises with OT providing demonstration and mod verbal/tactile cues for technique and muscle engagement:  Exercises - Seated Elbow Extension with Self-Anchored Resistance  - 1 x daily - 7 x weekly - 2 sets - 10 reps - Seated Shoulder Horizontal Abduction with Resistance  - 1 x daily - 7 x weekly - 2 sets - 10 reps - Seated Shoulder Flexion with Self-Anchored Resistance  - 1 x daily - 7 x weekly - 2 sets - 10 reps - Seated Elbow Flexion with Self-Anchored Resistance  - 1 x daily - 7 x weekly - 2 sets - 10 reps  Pt requesting to return to bed at end of session. Stand pivot using RW close supervision. EOB>supine supervision. Pt was left resting in bed with call bell in reach, bed alarm on, and all needs met.    Therapy Documentation Precautions:  Precautions Precautions: Fall Precaution Comments: wound vac L residual limb, R LE BKA Restrictions Weight Bearing Restrictions: Yes LLE Weight Bearing: Non weight bearing Other Position/Activity Restrictions: L limb protector re-ordered as it was lost from acute  Therapy/Group: Individual Therapy  Clide Deutscher 07/30/2023, 8:03 AM

## 2023-07-31 ENCOUNTER — Other Ambulatory Visit: Payer: Self-pay

## 2023-07-31 ENCOUNTER — Other Ambulatory Visit (HOSPITAL_COMMUNITY): Payer: Self-pay

## 2023-07-31 DIAGNOSIS — D509 Iron deficiency anemia, unspecified: Secondary | ICD-10-CM

## 2023-07-31 LAB — GLUCOSE, CAPILLARY
Glucose-Capillary: 119 mg/dL — ABNORMAL HIGH (ref 70–99)
Glucose-Capillary: 114 mg/dL — ABNORMAL HIGH (ref 70–99)
Glucose-Capillary: 125 mg/dL — ABNORMAL HIGH (ref 70–99)
Glucose-Capillary: 116 mg/dL — ABNORMAL HIGH (ref 70–99)

## 2023-07-31 LAB — BASIC METABOLIC PANEL
Anion gap: 8 (ref 5–15)
BUN: 20 mg/dL (ref 8–23)
CO2: 23 mmol/L (ref 22–32)
Calcium: 8.6 mg/dL — ABNORMAL LOW (ref 8.9–10.3)
Chloride: 100 mmol/L (ref 98–111)
Creatinine, Ser: 0.6 mg/dL — ABNORMAL LOW (ref 0.61–1.24)
GFR, Estimated: >60 mL/min (ref >60)
Glucose, Bld: 111 mg/dL — ABNORMAL HIGH (ref 70–99)
Potassium: 4 mmol/L (ref 3.5–5.1)
Sodium: 131 mmol/L — ABNORMAL LOW (ref 135–145)

## 2023-07-31 LAB — CBC
HCT: 31.2 % — ABNORMAL LOW (ref 39.0–52.0)
Hemoglobin: 9.9 g/dL — ABNORMAL LOW (ref 13.0–17.0)
MCH: 26.3 pg (ref 26.0–34.0)
MCHC: 31.7 g/dL (ref 30.0–36.0)
MCV: 83 fL (ref 80.0–100.0)
Platelets: 264 10*3/uL (ref 150–400)
RBC: 3.76 MIL/uL — ABNORMAL LOW (ref 4.22–5.81)
RDW: 14.7 % (ref 11.5–15.5)
WBC: 9 10*3/uL (ref 4.0–10.5)
nRBC: 0 % (ref 0.0–0.2)

## 2023-07-31 MED ORDER — GABAPENTIN 100 MG PO CAPS
200.0000 mg | ORAL_CAPSULE | Freq: Three times a day (TID) | ORAL | 0 refills | Status: DC
  Filled 2023-07-31: qty 180, 30d supply, fill #0

## 2023-07-31 MED ORDER — MELATONIN 5 MG PO TABS
5.0000 mg | ORAL_TABLET | Freq: Every evening | ORAL | 0 refills | Status: DC | PRN
  Filled 2023-07-31: qty 30, 30d supply, fill #0

## 2023-07-31 MED ORDER — PANTOPRAZOLE SODIUM 40 MG PO TBEC
40.0000 mg | DELAYED_RELEASE_TABLET | Freq: Every day | ORAL | 0 refills | Status: DC
  Filled 2023-07-31: qty 30, 30d supply, fill #0

## 2023-07-31 MED ORDER — DOCUSATE SODIUM 100 MG PO CAPS
100.0000 mg | ORAL_CAPSULE | Freq: Every day | ORAL | 0 refills | Status: DC
  Filled 2023-07-31: qty 100, 100d supply, fill #0

## 2023-07-31 MED ORDER — OXYCODONE HCL 5 MG PO TABS
5.0000 mg | ORAL_TABLET | Freq: Four times a day (QID) | ORAL | 0 refills | Status: DC | PRN
  Filled 2023-07-31: qty 28, 7d supply, fill #0

## 2023-07-31 MED ORDER — POLYETHYLENE GLYCOL 3350 17 G PO PACK
17.0000 g | PACK | Freq: Two times a day (BID) | ORAL | Status: DC
  Administered 2023-07-31 – 2023-08-01 (×2): 17 g via ORAL
  Filled 2023-07-31 (×2): qty 1

## 2023-07-31 MED ORDER — SODIUM CHLORIDE 1 G PO TABS
1.0000 g | ORAL_TABLET | Freq: Three times a day (TID) | ORAL | 0 refills | Status: DC
  Filled 2023-07-31: qty 90, 30d supply, fill #0

## 2023-07-31 MED ORDER — POLYETHYLENE GLYCOL 3350 17 GM/SCOOP PO POWD
17.0000 g | Freq: Two times a day (BID) | ORAL | 0 refills | Status: DC
  Filled 2023-07-31: qty 476, 14d supply, fill #0

## 2023-07-31 MED ORDER — LISINOPRIL 20 MG PO TABS
20.0000 mg | ORAL_TABLET | Freq: Every evening | ORAL | 0 refills | Status: DC
  Filled 2023-07-31: qty 30, 30d supply, fill #0

## 2023-07-31 MED ORDER — ASCORBIC ACID 1000 MG PO TABS
1000.0000 mg | ORAL_TABLET | Freq: Every day | ORAL | 0 refills | Status: DC
  Filled 2023-07-31: qty 100, 100d supply, fill #0

## 2023-07-31 MED ORDER — METFORMIN HCL ER 500 MG PO TB24
500.0000 mg | ORAL_TABLET | Freq: Two times a day (BID) | ORAL | 0 refills | Status: DC
  Filled 2023-07-31: qty 60, 30d supply, fill #0

## 2023-07-31 MED ORDER — SENNOSIDES-DOCUSATE SODIUM 8.6-50 MG PO TABS
2.0000 | ORAL_TABLET | Freq: Two times a day (BID) | ORAL | 0 refills | Status: DC
  Filled 2023-07-31: qty 100, 25d supply, fill #0

## 2023-07-31 MED ORDER — ATORVASTATIN CALCIUM 80 MG PO TABS
80.0000 mg | ORAL_TABLET | Freq: Every day | ORAL | 0 refills | Status: DC
  Filled 2023-07-31: qty 30, 30d supply, fill #0
  Filled 2023-10-10: qty 30, 30d supply, fill #1
  Filled 2023-12-18: qty 30, 30d supply, fill #2

## 2023-07-31 MED ORDER — FERROUS SULFATE 325 (65 FE) MG PO TABS
325.0000 mg | ORAL_TABLET | Freq: Every day | ORAL | 0 refills | Status: DC
  Filled 2023-07-31: qty 100, 100d supply, fill #0

## 2023-07-31 NOTE — Progress Notes (Signed)
PROGRESS NOTE   Subjective/Complaints:  Patient sitting at edge of bed this morning with daughter in his room.  Reports occasional pain however overall under control.  Daughter reports he is not frequently complaining of pain throughout the day.  He reports constipation.  Last BM 9/30  ROS: Patient denies fever, chills, CP, SOB, N/V/D, HA, new motor or sensory changes + Residual limb pain + Constipation   Objective:   No results found. Recent Labs    07/31/23 0718  WBC 9.0  HGB 9.9*  HCT 31.2*  PLT 264    Recent Labs    07/30/23 0511 07/31/23 0718  NA 133* 131*  K 4.1 4.0  CL 102 100  CO2 25 23  GLUCOSE 108* 111*  BUN 26* 20  CREATININE 0.56* 0.60*  CALCIUM 8.6* 8.6*     Intake/Output Summary (Last 24 hours) at 07/31/2023 0932 Last data filed at 07/31/2023 0913 Gross per 24 hour  Intake 680 ml  Output 450 ml  Net 230 ml        Physical Exam: Vital Signs Blood pressure 131/78, pulse 76, temperature 97.6 F (36.4 C), resp. rate 17, height 5\' 1"  (1.549 m), weight 54.1 kg, SpO2 100%.   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 Pt is cooperative Skin: warm and dry Neuro:  Makes eye contact and interacts appropriately. Following simple commands.  Fair sitting balance at edge of bed MSK: Left knee can extend to -5 from full extension      Assessment/Plan: 1. Functional deficits  which require 3+ hours per day of interdisciplinary therapy in a comprehensive inpatient rehab setting. Physiatrist is providing close team supervision and 24 hour management of active medical problems listed below. Physiatrist and rehab team continue to assess barriers to discharge/monitor patient progress toward functional and medical goals  Care Tool:  Bathing    Body parts bathed by patient: Right arm, Left arm, Chest, Abdomen, Front perineal area, Right upper leg, Left upper leg, Face, Buttocks, Right lower leg   Body parts bathed by helper: Right lower leg, Buttocks Body parts n/a: Left lower leg   Bathing assist Assist Level: Supervision/Verbal cueing     Upper Body Dressing/Undressing Upper body dressing   What is the patient wearing?: Pull over shirt    Upper body assist Assist Level: Set up assist    Lower Body Dressing/Undressing Lower body dressing      What is the patient wearing?: Underwear/pull up, Pants, Ace wrap/stump shrinker     Lower body assist Assist for lower body dressing: Minimal Assistance - Patient > 75%     Toileting Toileting    Toileting assist Assist for toileting: Contact Guard/Touching assist     Transfers Chair/bed transfer  Transfers assist     Chair/bed transfer assist level: Supervision/Verbal cueing     Locomotion Ambulation   Ambulation assist      Assist level: Minimal Assistance - Patient > 75% Assistive device: Walker-rolling Max distance: 63   Walk 10 feet activity   Assist     Assist level: Minimal Assistance - Patient > 75% Assistive device: Walker-rolling   Walk 50 feet activity   Assist Walk 50 feet with 2 turns activity did not occur: Safety/medical concerns  Assist level: Minimal Assistance - Patient > 75% Assistive device: Walker-rolling    Walk 150 feet activity   Assist Walk 150 feet activity did not occur: Safety/medical concerns         Walk 10 feet on uneven surface   activity   Assist Walk 10 feet on uneven surfaces activity did not occur: Safety/medical concerns         Wheelchair     Assist Is the patient using a wheelchair?: Yes Type of Wheelchair: Manual    Wheelchair assist level: Supervision/Verbal cueing Max wheelchair distance: 300+    Wheelchair 50 feet with 2 turns activity    Assist        Assist Level: Supervision/Verbal cueing   Wheelchair 150 feet activity     Assist      Assist Level: Supervision/Verbal cueing   Blood pressure 131/78, pulse 76, temperature 97.6 F (36.4 C), resp. rate 17, height 5\' 1"  (1.549 m), weight 54.1 kg, SpO2 100%.   Medical Problem List and Plan: 1. 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 Advised patient to check out and in at nursing station when using grounds pass   -Expected discharge 10/5    2. Impaired mobility: continue lovenox             -antiplatelet therapy: DAPT 3. Pain Management: oxycodone--Oxycontin 10 mg BID added (using oxy 15 TID in past 24 hours). as reports pain nagging and does not ask. Daughter has to advocate for him.              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  Mood/Behavior/Sleep:  LCSW to follow for evaluation and support.              -antipsychotic agents: N/A             --sleeping well except for pain 5. Neuropsych/cognition: This patient may not be fully capable of making decisions on his own behalf. 6. Skin/Wound Care:  Routine pressure relief measures. Continue wound VAC for 7-10 days.   -  10/1 wound VAC has been removed, monitor incision Gr 1 decub right heel prevalon boot ordered, continue to monitor 07/28/2023 7. Fluids/Electrolytes/Nutrition: Monitor I/O. Family concerned about his recent wt  loss/poor intake             --ensure max and beneprotein added to promote healing.              --continue Zinc, Juven and Vitamin C             --diet down graded to D2 (per family )due to edentulous state. (They are willing to further modify to puree if D2 still difficult to chew.) 8. T2DM: Monitor BS ac/hs. Continue Metformin 500 mg bid and Jardiance 25 mg. Will ask daughter to bring in Trulicity.  --Continue   novolog 4 units TID meal coverage for now-->may need to d/c if BS drop with increase in activity.  -9/26 Will DC mealtime novolog to avoid hypoglycemia 10/3 CBGs well-controlled CBG (last 3)  Recent Labs    07/30/23 1703 07/30/23 2058 07/31/23 0629  GLUCAP 132* 115* 119*   9. Anemia: Hgb has been trending down since July surgery. Wt loss and poor intake past 3 months. Will order anemia panel.   -9/27 Hgb was 9.8 yesterday, will add ferrous sulfate daily for iron deficiency -10/3 hemoglobin overall stable at 9.9 10. HTN: Monitor BP TID--continue Lisinopril and hydrochlorothiazide daily.   -9/26 BP soft, will discontinue hydrochlorothiazide  -10/3 well-controlled overall    07/31/2023    6:46 AM 07/30/2023    8:54 PM 07/30/2023    4:30 PM  Vitals with BMI  Weight   119 lbs 4 oz  BMI   22.55  Systolic 131 139   Diastolic 78 81   Pulse 76 79     11. Constipation: Will add Senna to colace.  Likely opioid related. --Dose of MOM  today as had been straining without results.  -9/26 sorbitol 60ml ordered with BM  07/27/2023 improved after increase senna to 2 tab BID - Lg type 6 stool at 0231 and small type 1 @ 2021 10/2 LBM was on 9/30, Start miralax daily, start sorbitol 30ml prn 10/3 discussed sorbitol if no BM by later today  12. PAD: On DAPT and Lipitor.    13. Screening for vitamin D deficiency: check vitamin D level tomor  14.  Hyponatremia.  Mild continue to monitor  -10/2 NA up to 133, Increase salt tabs to TID  -10/3 sodium down to 131, continue salt tabs recheck tomorrow  15.  Prerenal Azotemia.  -Cr/BUN down to 0.60/20 improved  LOS: 8 days A FACE TO FACE EVALUATION WAS PERFORMED  Fanny Dance 07/31/2023, 9:32 AM

## 2023-07-31 NOTE — Progress Notes (Signed)
Physical Therapy Session Note  Patient Details  Name: Patrick [name] MRN: [id number] Date of Birth: 12/25/1945  Today's Date: 07/31/2023 PT Individual Time: 0801-0857, 4403-4742 PT Individual Time Calculation (min): 56 min, 70 min   Short Term Goals: Week 1:  PT Short Term Goal 1 (Week 1): STG=LTG 2/2 ELOS  Skilled Therapeutic Interventions/Progress Updates:      Treatment Session 1   Pt seated EOB with daughter in room upon arrival. Pt agreeable to therapy. Pt reports constipation. MD notified during AM rounds. Pt reports he is better able  to tolerate the limb protector with the sleeve provided by hanger between his skin and limb protector.   Pt daughter provided measurements for home:   Bathroom door: 23 inches  Front door: 35 inches   Back door 30 inches  Bedroom door 29 inches   Distance from bedroom to bathroom 11 feet   Distance from bedroom to living room 15 feet  Education provided that pt WC will fit through all areas except bathroom, but walker will fit.   Pt donned limb protector with supervision, verbal cues provided for technique. Pt performed sit to stand and gait x50 feet with RW and supervision.   Pt wife not present during session. Pt performed stand pivot transfer WC to shower chair with verbal cues for sequencing and safety within walker within tight space. Education provided to daughter to daughter to provide CGA with transfer for safety, and to provide step by step sequencing. Pt and daughter returned demonstration of WC to shower chair transfer. Pt ascended/descended 2x5 6 inch steps with shower chair technique in hospital stair well to simulate home enviornment as pt does not have handrail, with daughter providing assist standing infront of pt, and therapist managing shower chair. Daughter instructing patient on techique and UE positioning. Daughter able to instruct therapist on how to place the shower chair.   Pt requires min A for donning/doffing leg  rests.  Education provided to daughter how to donn and doff. Pt returned demonstration.   Pt performed stand pivot transfer WC to bed with supervision, and doffed limb protector with supervision. Pt supine in bed at end of session with all needs within reach and daugther in room.   Treatment Session 2  Pt seated in WC upon arrival. Pt agreeable to therapy. Pt reports abdominal pain 2/2 constipation. Pt toileted throughout session but unable to have BM. Notified nurisng. Nursing present to administer medication.   Pt wife present throughout session. Interpretor present.   Pt WC in room, while pt attempting to use bathroom, therapist provided education to wife on how to donn and doff leg rests.   Pt self propelled WC 150+ feet with mod I.   Pt ambulated from bed to bathroom x4 throughout session with CGA for urgency, verbal cues provided for safety.   Reviewed and performed the following HEP. Handout provided in spanish. Interpretor wrote specific instructions in spanish to specify R versus L LE positioning.   Access Code: [email or web address] URL: [email or web address] Date: 07/31/2023 Prepared by: Ambrose Finland  Exercises - Single Leg Bridge  - 1 x daily - 7 x weekly - 3 sets - 10 reps - Prone Hip Extension  - 1 x daily - 7 x weekly - 3 sets - 10 reps - Sidelying Hip Abduction  - 1 x daily - 7 x weekly - 3 sets - 10 reps - Supine Active Straight Leg Raise  - 1 x daily - 7 x weekly - 3 sets -  10 reps - Long Sitting Quad Set  - 1 x daily - 7 x weekly - 3 sets - 10 reps - Seated Long Arc Quad  - 1 x daily - 7 x weekly - 3 sets - 10 reps - Standing Hip Extension with Counter Support  - 1 x daily - 7 x weekly - 3 sets - 10 reps - Standing Hip Abduction with Counter Support  - 1 x daily - 7 x weekly - 3 sets - 10 reps  Pt supine in bed with all needs within reach and bed alarm on with wife in room.   Therapy Documentation Precautions:  Precautions Precautions: Fall Precaution  Comments: wound vac L residual limb, R LE BKA Restrictions Weight Bearing Restrictions: Yes LLE Weight Bearing: Non weight bearing Other Position/Activity Restrictions: L limb protector re-ordered as it was lost from acute  Therapy/Group: Individual Therapy  [facility] Ambrose Finland, [place], DPT  07/31/2023, 7:40 AM

## 2023-07-31 NOTE — Progress Notes (Signed)
Physical Therapy Session Note  Patient Details  Name: Patrick [name] MRN: [id number] Date of Birth: 05/26/1946  Today's Date: 07/31/2023 PT Individual Time: 0934-1000 PT Individual Time Calculation (min): 26 min   Short Term Goals: Week 1:  PT Short Term Goal 1 (Week 1): STG=LTG 2/2 ELOS  Skilled Therapeutic Interventions/Progress Updates:    Pt presents in room in bed, agreeable to PT however complaining of pain from constipation and requesting to use restroom. Session focused on therapeutic activity to facilitate participation with self care tasks as well as gait training for multidirectional stepping stability.  Pt completes bed mobility with supervision, sit<>stand to RW with supervision. Pt ambulates to toilet in bathroom ~15' with RW CGA over threshold, CGA for pt to manage pants with unilateral UE support. Pt completes transfer with CGA and periarea hygiene with supervision in sitting position. Pt does not have BM or void urine. Pt ambulates back to sink to complete hand hygiene with supervision. Pt returns to sitting in WC with x3 backwards steps to WC.  Pt self propels WC to/from day room 150' with supervision and directional cues. Pt then completes gait training forward/backward ambulation with RW CGA 3x15', seated rest breaks between trials with pt demonstrating improved sequencing and safety with RW for backwards ambulation with each trial, provided with verbal and visual cues for sequencing. Pt then completes side stepping with RW 2x5' to R/L with CGA good sequencing noted. Pt provided with cues for hand placement with each sit<>stand due to pt moving quickly.  Pt returns to room and remains seated in [facility] with all needs within reach, cal light in place with family present at end of session to await OT sesison.   Therapy Documentation Precautions:  Precautions Precautions: Fall Precaution Comments: wound vac L residual limb, R LE BKA Restrictions Weight Bearing Restrictions:  Yes LLE Weight Bearing: Non weight bearing Other Position/Activity Restrictions: L limb protector re-ordered as it was lost from acute   Therapy/Group: Individual Therapy  Edwin Cap PT, DPT 07/31/2023, 10:46 AM

## 2023-07-31 NOTE — Progress Notes (Signed)
Occupational Therapy Session Note  Patient Details  Name: Patrick [name] MRN: [id number] Date of Birth: 07/11/1946  Today's Date: 07/31/2023 OT Individual Time: 1000-1045 OT Individual Time Calculation (min): 45 min    Short Term Goals: Week 2:  OT Short Term Goal 1 (Week 2): STGs=LTGs due to patient's length of stay.  Skilled Therapeutic Interventions/Progress Updates:   Pt seen for skilled OT with progression of pt completing more of L residual limb care for ADL's and mobility. Pt on toilet at start of session but no BM completed due to constipation. Pt able to demo removal of current shrinker and donn with close s and min cues. Pt able to don L limb protector with set up. Amb from toilet to sink and stand with RW with CGA. LB dressing with CGA. Shaving sink side seated with set up only. Dtr present for ongoing infomal family education. Left pt with chair alarm, needs, and nurse call button in reach.   Pain: 1/10 L residual limb   Therapy Documentation Precautions:  Precautions Precautions: Fall Precaution Comments: wound vac L residual limb, R LE BKA Restrictions Weight Bearing Restrictions: Yes LLE Weight Bearing: Non weight bearing Other Position/Activity Restrictions: L limb protector re-ordered as it was lost from acute   Therapy/Group: Individual Therapy  Vicenta Dunning 07/31/2023, 7:35 AM

## 2023-07-31 NOTE — Progress Notes (Signed)
Patient ID: Patrick [name], male   DOB: [birth year], 77 y.o.   MRN: [id number]  Match placed in chart for assist with prescriptions -Pam-PA aware. Has received the equipment ordered-wheelchair, bedside commode and tub bench. Family here and attending therapies with pt and Maria-daughter to come tomorrow also.

## 2023-07-31 NOTE — Progress Notes (Signed)
Physical Therapy Discharge Summary  Patient Details  Name: Patrick [name] MRN: [id number] Date of Birth: 1946/02/19  Date of Discharge from PT service:July 31, 2023  Patient has met 8 of 8 long term goals due to improved activity tolerance, improved balance, increased strength, decreased pain, ability to compensate for deficits, improved attention, improved awareness, and improved coordination. Patient to discharge at a wheelchair level with household ambulation  CGA and +2 min A for stair navigation . Patient's care partner is independent to provide the necessary physical assistance at discharge.  Recommendation:  Patient will benefit from ongoing skilled PT services in outpatient setting to continue to advance safe functional mobility, address ongoing impairments in gait, balance, endurance, coordination, and minimize fall risk.  Equipment: WC, RW, shower chair, reacher transfer tub bench, BSC  Reasons for discharge: treatment goals met and discharge from hospital  Patient/family agrees with progress made and goals achieved: Yes  PT Discharge Precautions/Restrictions Precautions Precautions: Fall Precaution Comments: R LE BKA Required Braces or Orthoses: Other Brace Other Brace: R LE limb protector, shrinker Restrictions Weight Bearing Restrictions: No LLE Weight Bearing: Non weight bearing Pain Interference Pain Interference Pain Effect on Sleep: 1. Rarely or not at all Pain Interference with Therapy Activities: 1. Rarely or not at all Pain Interference with Day-to-Day Activities: 1. Rarely or not at all Vision/Perception  Vision - History Ability to See in Adequate Light: 0 Adequate Perception Perception: Within Functional Limits Praxis Praxis: WFL  Cognition Overall Cognitive Status: History of cognitive impairments - at baseline Arousal/Alertness: Awake/alert Orientation Level: Oriented X4 Memory: Impaired Memory Impairment: Decreased short term  memory Awareness: Impaired Problem Solving: Appears intact Behaviors: Impulsive Safety/Judgment: Impaired (impulsive) Sensation Sensation Light Touch: Impaired by gross assessment Additional Comments: pt reports nerve like pain in residual limb-"tingling" Coordination Gross Motor Movements are Fluid and Coordinated: No Fine Motor Movements are Fluid and Coordinated: No Coordination and Movement Description: impaired by R LE BKA Finger Nose Finger Test: intact R hand, 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 DIPs Motor  Motor Motor: Other (comment) Motor - Skilled Clinical Observations: R LE BKA  Mobility Bed Mobility Bed Mobility: Rolling Right;Rolling Left;Supine to Sit;Sit to Supine Rolling Right: Independent Rolling Left: Independent Supine to Sit: Independent Sit to Supine: Independent Transfers Transfers: Sit to Stand;Stand to Sit;Stand Pivot Transfers Sit to Stand: Supervision/Verbal cueing Stand to Sit: Supervision/Verbal cueing Stand Pivot Transfers: Supervision/Verbal cueing Stand Pivot Transfer Details: Verbal cues for safe use of DME/AE;Verbal cues for precautions/safety Transfer (Assistive device): Rolling walker Locomotion  Gait Ambulation: Yes Gait Assistance: Supervision/Verbal cueing Gait Distance (Feet): 60 Feet Assistive device: Rolling walker Gait Assistance Details: Verbal cues for safe use of DME/AE;Verbal cues for precautions/safety;Visual cues/gestures for sequencing Gait Gait: Yes Gait Pattern: Impaired Gait Pattern:  (hop to gait) Stairs / Additional Locomotion Stairs: Yes Stairs Assistance: Minimal Assistance - Patient > 75%;Supervision/Verbal cueing (shower chair technique-supervision with use of rail, min A without use of rail (per home set up)) Stair Management Technique: No rails;One rail Left Number of Stairs: 5 Height of Stairs: 6 Wheelchair Mobility Wheelchair Mobility: Yes Wheelchair Assistance: Independent with  Scientist, research (life sciences): Both upper extremities Wheelchair Parts Management: Needs assistance  Trunk/Postural Assessment  Cervical Assessment Cervical Assessment: Within Lobbyist Thoracic Assessment: Within Functional Limits Lumbar Assessment Lumbar Assessment: Within Functional Limits Postural Control Postural Control: Deficits on evaluation Righting Reactions: delayed Protective Responses: delayed  Balance Balance Balance Assessed: Yes Static Sitting Balance Static Sitting - Level  of Assistance: 7: Independent Dynamic Sitting Balance Dynamic Sitting - Level of Assistance: 6: Modified independent (Device/Increase time) Static Standing Balance Static Standing - Balance Support: Bilateral upper extremity supported Static Standing - Level of Assistance: 5: Stand by assistance (supervision) Dynamic Standing Balance Dynamic Standing - Balance Support: Bilateral upper extremity supported Dynamic Standing - Level of Assistance: 5: Stand by assistance (supervision) Extremity Assessment  RLE Assessment RLE Assessment: Exceptions to [facility] General Strength Comments: grossly 4/5 LLE Assessment LLE Assessment: Exceptions to [facility] General Strength Comments: grossly 3/5   Jimmy Picket Zaunegger PT, DPT 07/31/2023, 4:18 PM

## 2023-08-01 ENCOUNTER — Inpatient Hospital Stay (HOSPITAL_COMMUNITY): Payer: Self-pay

## 2023-08-01 ENCOUNTER — Other Ambulatory Visit (HOSPITAL_COMMUNITY): Payer: Self-pay

## 2023-08-01 LAB — BASIC METABOLIC PANEL
Anion gap: 12 (ref 5–15)
BUN: 14 mg/dL (ref 8–23)
CO2: 19 mmol/L — ABNORMAL LOW (ref 22–32)
Calcium: 9 mg/dL (ref 8.9–10.3)
Chloride: 99 mmol/L (ref 98–111)
Creatinine, Ser: 0.59 mg/dL — ABNORMAL LOW (ref 0.61–1.24)
GFR, Estimated: >60 mL/min (ref >60)
Glucose, Bld: 109 mg/dL — ABNORMAL HIGH (ref 70–99)
Potassium: 4.2 mmol/L (ref 3.5–5.1)
Sodium: 130 mmol/L — ABNORMAL LOW (ref 135–145)

## 2023-08-01 LAB — GLUCOSE, CAPILLARY
Glucose-Capillary: 95 mg/dL (ref 70–99)
Glucose-Capillary: 98 mg/dL (ref 70–99)
Glucose-Capillary: 99 mg/dL (ref 70–99)
Glucose-Capillary: 94 mg/dL (ref 70–99)

## 2023-08-01 MED ORDER — ASPIRIN 81 MG PO TBEC
81.0000 mg | DELAYED_RELEASE_TABLET | Freq: Every day | ORAL | Status: DC
  Administered 2023-08-02: 81 mg via ORAL
  Filled 2023-08-01: qty 1

## 2023-08-01 MED ORDER — BISACODYL 10 MG RE SUPP
10.0000 mg | Freq: Every day | RECTAL | Status: DC
  Administered 2023-08-01 – 2023-08-03 (×2): 10 mg via RECTAL
  Filled 2023-08-01 (×2): qty 1

## 2023-08-01 MED ORDER — POLYETHYLENE GLYCOL 3350 17 G PO PACK
17.0000 g | PACK | Freq: Two times a day (BID) | ORAL | Status: DC
  Administered 2023-08-01 – 2023-08-03 (×4): 17 g via ORAL
  Filled 2023-08-01 (×4): qty 1

## 2023-08-01 MED ORDER — METOCLOPRAMIDE HCL 5 MG/ML IJ SOLN
5.0000 mg | Freq: Four times a day (QID) | INTRAMUSCULAR | Status: DC
  Administered 2023-08-01 – 2023-08-02 (×4): 5 mg via INTRAVENOUS
  Filled 2023-08-01 (×5): qty 2

## 2023-08-01 MED ORDER — BISACODYL 10 MG RE SUPP: 10.0000 mg | Freq: Once | RECTAL | Status: DC

## 2023-08-01 MED ORDER — POLYETHYLENE GLYCOL 3350 17 G PO PACK: 17.0000 g | PACK | Freq: Two times a day (BID) | ORAL | Status: DC

## 2023-08-01 MED ORDER — CLOPIDOGREL BISULFATE 75 MG PO TABS
75.0000 mg | ORAL_TABLET | Freq: Every day | ORAL | Status: DC
  Administered 2023-08-02: 75 mg via ORAL
  Filled 2023-08-01 (×2): qty 1

## 2023-08-01 MED ORDER — FLEET ENEMA RE ENEM
1.0000 | ENEMA | Freq: Once | RECTAL | Status: AC
  Administered 2023-08-01: 1 via RECTAL

## 2023-08-01 MED ORDER — LIDOCAINE HCL URETHRAL/MUCOSAL 2 % EX GEL
CUTANEOUS | Status: DC | PRN
  Administered 2023-08-01 – 2023-08-03 (×2): 6
  Filled 2023-08-01 (×3): qty 6

## 2023-08-01 MED ORDER — POLYETHYLENE GLYCOL 3350 17 G PO PACK: 34.0000 g | PACK | Freq: Once | ORAL | Status: DC

## 2023-08-01 MED ORDER — SENNOSIDES-DOCUSATE SODIUM 8.6-50 MG PO TABS
2.0000 | ORAL_TABLET | Freq: Two times a day (BID) | ORAL | Status: DC
  Administered 2023-08-03: 2 via ORAL
  Filled 2023-08-01: qty 2

## 2023-08-01 NOTE — Plan of Care (Signed)
  Problem: RH Balance Goal: LTG: Patient will maintain dynamic sitting balance (OT) Description: LTG:  Patient will maintain dynamic sitting balance with assistance during activities of daily living (OT) Outcome: Completed/Met Goal: LTG Patient will maintain dynamic standing with ADLs (OT) Description: LTG:  Patient will maintain dynamic standing balance with assist during activities of daily living (OT)  Outcome: Completed/Met   Problem: Sit to Stand Goal: LTG:  Patient will perform sit to stand in prep for activites of daily living with assistance level (OT) Description: LTG:  Patient will perform sit to stand in prep for activites of daily living with assistance level (OT) Outcome: Completed/Met   Problem: RH Grooming Goal: LTG Patient will perform grooming w/assist,cues/equip (OT) Description: LTG: Patient will perform grooming with assist, with/without cues using equipment (OT) Outcome: Completed/Met   Problem: RH Bathing Goal: LTG Patient will bathe all body parts with assist levels (OT) Description: LTG: Patient will bathe all body parts with assist levels (OT) Outcome: Completed/Met   Problem: RH Dressing Goal: LTG Patient will perform upper body dressing (OT) Description: LTG Patient will perform upper body dressing with assist, with/without cues (OT). Outcome: Completed/Met Goal: LTG Patient will perform lower body dressing w/assist (OT) Description: LTG: Patient will perform lower body dressing with assist, with/without cues in positioning using equipment (OT) Outcome: Completed/Met   Problem: RH Toileting Goal: LTG Patient will perform toileting task (3/3 steps) with assistance level (OT) Description: LTG: Patient will perform toileting task (3/3 steps) with assistance level (OT)  Outcome: Completed/Met   Problem: RH Toilet Transfers Goal: LTG Patient will perform toilet transfers w/assist (OT) Description: LTG: Patient will perform toilet transfers with assist,  with/without cues using equipment (OT) Outcome: Completed/Met   Problem: RH Tub/Shower Transfers Goal: LTG Patient will perform tub/shower transfers w/assist (OT) Description: LTG: Patient will perform tub/shower transfers with assist, with/without cues using equipment (OT) Outcome: Completed/Met   

## 2023-08-01 NOTE — Progress Notes (Addendum)
Inpatient Rehabilitation Care Coordinator Discharge Note DC SAT 10/5  Patient Details  Name: Patrick [name] MRN: [id number] Date of Birth: [birth year]   Discharge location: HOME WITH WIFE AND  DAUGHTER AND DAUGHTER'S FAMILY. WILL HAVE 24/7 CARE  Length of Stay: 10 DAYS  Discharge activity level: MOD/I WHEELCHAIR-MIN-AMBULATION  Home/community participation: ACTIVE  Patient response GU:YQIHKV Literacy - How often do you need to have someone help you when you read instructions, pamphlets, or other written material from your doctor or pharmacy?: Always  Patient response QQ:VZDGLO Isolation - How often do you feel lonely or isolated from those around you?: Never  Services provided included: MD, RD, PT, OT, RN, CM, Pharmacy, SW  Financial Services:  Financial Services Utilized: Other (Comment) (UNINSURED)    Choices offered to/list presented to: PT AND DAUGHTER  Follow-up services arranged:  DME, Patient/Family has no preference for HH/DME agencies   HOME EXERCISE PROGRAM GIVEN TO PT AND FAMILY   DME : [facility]  WHEELCHAIR, 3 IN1, TUB BENCH AND ROLLING WALKER GIVEN TO HIM MATCH PLACED FOR PRESCRIPTION ASSISTANCE FOR 30 DAYS    Patient response to transportation need: Is the patient able to respond to transportation needs?: Yes In the past 12 months, has lack of transportation kept you from medical appointments or from getting medications?: No In the past 12 months, has lack of transportation kept you from meetings, work, or from getting things needed for daily living?: No   Patient/Family verbalized understanding of follow-up arrangements:  Yes  Individual responsible for coordination of the follow-up plan: [facility] [phone]  Confirmed correct DME delivered: Patrick [name] 08/01/2023    Comments (or additional information):PT DID WELL AND FAMILY MEMBERS WERE HERE DAILY AND PROVIDING SUPPORT AND INTERPRETED AT TIMES. DID HAVE AN INTERPRETER WHILE IN  THERAPIES  Summary of Stay    Date/Time Discharge Planning CSW  07/30/23 0856 Home with wife and daughter and her family will have 24/7 supervision. Is uninsured so will try to get charity home health and equipment. Family ed Friday with daughter RGD       Si Gaul, Lemar Livings

## 2023-08-01 NOTE — Progress Notes (Signed)
Inpatient Rehabilitation Discharge Medication Review by a Pharmacist  A complete drug regimen review was completed for this patient to identify any potential clinically significant medication issues.  High Risk Drug Classes Is patient taking? Indication by Medication  Antipsychotic {Receiving?:26196}   Anticoagulant {Receiving?:26196}   Antibiotic {Receiving?:26196}   Opioid {Receiving?:26196} Oxycodone: pain  Antiplatelet {Receiving?:26196} Aspirin, Plavix: PVD  Hypoglycemics/insulin {Yes or No?:26198} Trulicity, jardiance, metformin: diabetes  Vasoactive Medication {Receiving?:26196} Lisinopril: hypertension  Chemotherapy {Receiving Chemo?:26197}   Other Yes Tylenol, gabapentin: pain Lipitor: hyperlipidemia Docusate, Miralax, senokot: constipation Protonix: reflux Ferrous sulfate, vitamin C, sodium chloride: vitamin/supplement Melatonin: sleep      Type of Medication Issue Identified Description of Issue Recommendation(s)  Drug Interaction(s) (clinically significant)     Duplicate Therapy     Allergy     No Medication Administration End Date     Incorrect Dose     Additional Drug Therapy Needed     Significant med changes from prior encounter (inform family/care partners about these prior to discharge).  Restart or discontinue as appropriate. Communicate medication changes with patient/family at discharge  Other       Clinically significant medication issues were identified that warrant physician communication and completion of prescribed/recommended actions by midnight of the next day:  {Yes or No?:26198}  Name of provider notified for urgent issues identified: ***   Provider Method of Notification: ***    Pharmacist comments: ***   Time spent performing this drug regimen review (minutes): 30   Thank you for allowing pharmacy to be a part of this patient's care.   Signe Colt, PharmD 08/01/2023 3:01 PM    **Pharmacist phone directory can be found on  [email or web address] listed under [facility]**

## 2023-08-01 NOTE — Progress Notes (Addendum)
Occupational Therapy Discharge Summary  Patient Details  Name: Patrick [name] MRN: [id number] Date of Birth: [birth year]  Date of Discharge from OT service:August 01, 2023  Today's Date: 08/01/2023 OT Individual Time: 8657-8469 OT Individual Time Calculation (min): 10 min  and Today's Date: 08/01/2023 OT Missed Time: 50 Minutes Missed Time Reason: Other (comment) (Pt toileting, recent supossitory.)  Today's Date: 08/01/2023 OT Individual Time: 6295-2841 OT Individual Time Calculation (min): 35 min  and Today's Date: 08/01/2023 OT Missed Time: 25 Minutes Missed Time Reason: Other (comment) (Digestional discomfort; time to void)  Patient has met 10 of 10 long term goals due to improved activity tolerance, improved balance, postural control, ability to compensate for deficits, functional use of  RIGHT upper, LEFT upper, improved attention, and improved awareness.  Patient to discharge at overall Supervision level.  Patient's care partner is independent to provide the necessary physical and cognitive assistance at discharge.    Reasons goals not met: NA  Recommendation:  Patient will benefit from ongoing skilled OT services in home health setting to continue to advance functional skills in the area of BADL, iADL, and Reduce care partner burden.  Equipment: BSC & TTB  Reasons for discharge: treatment goals met and discharge from hospital  Patient/family agrees with progress made and goals achieved: Yes  OT Discharge Precautions/Restrictions  Precautions Precautions: Fall Precaution Comments: L LE BKA Required Braces or Orthoses: Other Brace Other Brace: L LE limb protector, shrinker Restrictions Weight Bearing Restrictions: Yes LLE Weight Bearing: Non weight bearing ADL ADL Eating: Modified independent Where Assessed-Eating: Wheelchair Grooming: Modified independent Where Assessed-Grooming: Wheelchair Upper Body Bathing: Modified independent Where Assessed-Upper Body  Bathing: Shower Lower Body Bathing: Supervision/safety Where Assessed-Lower Body Bathing: Shower Upper Body Dressing: Modified independent (Device) Where Assessed-Upper Body Dressing: Edge of bed Lower Body Dressing: Supervision/safety Where Assessed-Lower Body Dressing: Edge of bed Toileting: Supervision/safety Where Assessed-Toileting: Toilet, Psychiatrist Transfer: Close supervision Toilet Transfer Method: Proofreader: Bedside commode, Other (comment) (RW) Tub/Shower Transfer: Close supervison Tub/Shower Transfer Method: Ship broker: Insurance underwriter: Close supervision Film/video editor Method: Designer, industrial/product: Emergency planning/management officer ADL Comments: CGA UB self care sinkside, mod A LB sink side and standing with RW with OT managing line and wound vac. Min A SPT- no shower due to wound vac Vision Baseline Vision/History: 0 No visual deficits Patient Visual Report: No change from baseline Vision Assessment?: No apparent visual deficits Perception  Perception: Within Functional Limits Praxis Praxis: WFL Cognition Cognition Overall Cognitive Status: History of cognitive impairments - at baseline Arousal/Alertness: Awake/alert Memory: Impaired Memory Impairment: Decreased short term memory Awareness: Impaired Problem Solving: Appears intact Behaviors: Impulsive Safety/Judgment: Impaired Brief Interview for Mental Status (BIMS) Repetition of Three Words (First Attempt): 3 Temporal Orientation: Year: Correct Temporal Orientation: Month: Accurate within 5 days Temporal Orientation: Day: Incorrect Recall: "Sock": No, could not recall Recall: "Blue": No, could not recall Recall: "Bed": Yes, after cueing ("a piece of furniture") BIMS Summary Score: 9 Sensation Sensation Light Touch: Impaired by gross assessment Hot/Cold: Appears Intact Proprioception: Appears  Intact Stereognosis: Appears Intact Additional Comments: Pt reports nerve pain in residual limb. 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 Motor  Motor Motor: Other (comment) Motor - Skilled Clinical Observations: R LE BKA Mobility  Bed Mobility Bed Mobility: Rolling Right;Rolling Left;Supine to  Sit;Sit to Supine Rolling Right: Independent Rolling Left: Independent Supine to Sit: Independent Sit to Supine: Independent Transfers Sit to Stand: Supervision/Verbal cueing Stand to Sit: Supervision/Verbal cueing  Trunk/Postural Assessment  Cervical Assessment Cervical Assessment: Within Functional Limits Thoracic Assessment Thoracic Assessment: Within Functional Limits Lumbar Assessment Lumbar Assessment: Within Functional Limits Postural Control Postural Control: Within Functional Limits Righting Reactions: Delayed Protective Responses: Delayed  Balance Balance Balance Assessed: Yes Static Sitting Balance Static Sitting - Level of Assistance: 7: Independent Dynamic Sitting Balance Dynamic Sitting - Balance Support: During functional activity Dynamic Sitting - Level of Assistance: 6: Modified independent (Device/Increase time) Dynamic Sitting - Balance Activities: Lateral lean/weight shifting;Forward lean/weight shifting;Reaching for objects Static Standing Balance Static Standing - Balance Support: Bilateral upper extremity supported Static Standing - Level of Assistance: 5: Stand by assistance (supervision) Dynamic Standing Balance Dynamic Standing - Balance Support: During functional activity Dynamic Standing - Level of Assistance: 5: Stand by assistance (supervision) Dynamic Standing - Balance Activities: Lateral lean/weight shifting;Forward lean/weight shifting;Reaching for  objects Extremity/Trunk Assessment RUE Assessment RUE Assessment: Within Functional Limits LUE Assessment LUE Assessment: Within Functional Limits  Session 1: Pt received sitting on BSC with heightened digestional pain due to constipation. OT dons hospital sock on R-foot (re-educated on importance for safety during mobility), providing patient with time to void,  pt missing ~30 mins of skilled intervention. OT returns to attempt session again, pt still on BSC, provided with sleeve for residual limb skin integrity. Missing a total of 50 mins out of scheduled 60 minutes.   Session 2: Pt received sitting on toilet with continued digestional discomfort, pt politely requesting time to void. Upon OT return, pt performs toilet transfer and 3/3 toileting activities with supervision + unilateral support on grab bar. Skilled OT session with focus discharge planning and further caregiver education. Pt agreeable to interventions, demonstrating overall pleasant mood. Pt with digestional pain/discomfort. OT offering intermediate rest breaks and positioning suggestions throughout session to address pain/fatigue and maximize participation/safety in session.   Pt's spouse present for caregiver education with focus on current patient functioning. Functional transfers and LB dressing/bathing, with emphasize placed on shinker/limb guard management. Pt's spouse competes hands-on training for functional transfers, all questions answered at end of session. RN updated.  Pt remained resting in bed with all immediate needs met at end of session. Pt continues to be appropriate for skilled OT intervention to promote further functional independence.   Lou Cal, OTR/L, MSOT  08/01/2023, 8:24 AM

## 2023-08-01 NOTE — Progress Notes (Addendum)
PROGRESS NOTE   Subjective/Complaints:  Pt sitting on commode, trying to have BM today. Enema ordered and had BM recorded.  Pain is improved.   ROS: Patient denies fever, chills, CP, SOB, N/V/D, HA, new motor or sensory changes  + Residual limb pain-improved + Constipation   Objective:   No results found. Recent Labs    07/31/23 0718  WBC 9.0  HGB 9.9*  HCT 31.2*  PLT 264    Recent Labs    07/30/23 0511 07/31/23 0718  NA 133* 131*  K 4.1 4.0  CL 102 100  CO2 25 23  GLUCOSE 108* 111*  BUN 26* 20  CREATININE 0.56* 0.60*  CALCIUM 8.6* 8.6*     Intake/Output Summary (Last 24 hours) at 08/01/2023 0840 Last data filed at 07/31/2023 1901 Gross per 24 hour  Intake 680 ml  Output 250 ml  Net 430 ml        Physical Exam: Vital Signs Blood pressure 135/78, pulse 74, temperature 98.4 F (36.9 C), temperature source Oral, resp. rate 16, height 5\' 1"  (1.549 m), weight 55.1 kg, SpO2 99%.   General: No acute distress, sitting on commode  Mood and affect are appropriate Heart: Regular rate and rhythm no rubs murmurs or extra sounds Lungs: Clear to auscultation, breathing unlabored, no rales or wheezes Abdomen: Positive bowel sounds, soft nontender to palpation, mildly distended Extremities: No clubbing, cyanosis, or edema Skin: No evidence of breakdown, no evidence of rash, RIght heel boggy  Extremities: Left BKA with surgical- limb protector in place  No significant tenderness to palpation residual limb L BKA Psych: Pleasant Skin: warm and dry Neuro:  Makes eye contact and interacts appropriately. Following simple commands.  Fair sitting balance MSK: Left knee can extend to -5 from full extension      Assessment/Plan: 1. Functional deficits which require 3+ hours per day of interdisciplinary therapy in a comprehensive inpatient rehab setting. Physiatrist is providing close team supervision and 24 hour  management of active medical problems listed below. Physiatrist and rehab team continue to assess barriers to discharge/monitor patient progress toward functional and medical goals  Care Tool:  Bathing    Body parts bathed by patient: Right arm, Left arm, Chest, Abdomen, Front perineal area, Right upper leg, Left upper leg, Face, Buttocks, Right lower leg   Body parts bathed by helper: Right lower leg, Buttocks Body parts n/a: Left lower leg   Bathing assist Assist Level: Supervision/Verbal cueing     Upper Body Dressing/Undressing Upper body dressing   What is the patient wearing?: Pull over shirt    Upper body assist Assist Level: Set up assist    Lower Body Dressing/Undressing Lower body dressing      What is the patient wearing?: Underwear/pull up, Pants, Ace wrap/stump shrinker     Lower body assist Assist for lower body dressing: Minimal Assistance - Patient > 75%     Toileting Toileting    Toileting assist Assist for toileting: Contact Guard/Touching assist     Transfers Chair/bed transfer  Transfers assist     Chair/bed transfer assist level: Supervision/Verbal cueing     Locomotion Ambulation   Ambulation assist  Assist level: Supervision/Verbal cueing Assistive device: Walker-rolling Max distance: 60   Walk 10 feet activity   Assist     Assist level: Supervision/Verbal cueing Assistive device: Walker-rolling   Walk 50 feet activity   Assist Walk 50 feet with 2 turns activity did not occur: Safety/medical concerns  Assist level: Supervision/Verbal cueing Assistive device: Walker-rolling    Walk 150 feet activity   Assist Walk 150 feet activity did not occur: Safety/medical concerns (fatigue)         Walk 10 feet on uneven surface  activity   Assist Walk 10 feet on uneven surfaces activity did not occur: Safety/medical concerns         Wheelchair     Assist Is the patient using a wheelchair?: Yes Type of  Wheelchair: Manual    Wheelchair assist level: Independent Max wheelchair distance: 130ft    Wheelchair 50 feet with 2 turns activity    Assist        Assist Level: Independent   Wheelchair 150 feet activity     Assist      Assist Level: Independent   Blood pressure 135/78, pulse 74, temperature 98.4 F (36.9 C), temperature source Oral, resp. rate 16, height 5\' 1"  (1.549 m), weight 55.1 kg, SpO2 99%.   Medical Problem List and Plan: 1. 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 Advised patient to check out and in at nursing station when using grounds pass   -Expected discharge 10/5, will recheck abd xray and BMP tomorrow before discharge    2. Impaired mobility: continue lovenox             -antiplatelet therapy: DAPT 3. Pain Management: oxycodone--Oxycontin 10 mg BID added (using oxy 15 TID in past 24 hours). as reports pain nagging and does not ask. Daughter has to advocate for him.              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 Mood/Behavior/Sleep:  LCSW to follow for evaluation and support.               -antipsychotic agents: N/A             --sleeping well except for pain 5. Neuropsych/cognition: This patient may not be fully capable of making decisions on his own behalf. 6. Skin/Wound Care:  Routine pressure relief measures. Continue wound VAC for 7-10 days.   -10/1 wound VAC has been removed, monitor incision Gr 1  decub right heel prevalon boot ordered, continue to monitor  7. Fluids/Electrolytes/Nutrition: Monitor I/O. Family concerned about his recent wt  loss/poor intake             --ensure max and beneprotein added to promote healing.              --continue Zinc, Juven and Vitamin C             --diet down graded to D2 (per family )due to edentulous state. (They are willing to further modify to puree if D2 still difficult to chew.) 8. T2DM: Monitor BS ac/hs. Continue Metformin 500 mg bid and Jardiance 25 mg. Will ask daughter to bring in Trulicity.  --Continue   novolog 4 units TID meal coverage for now-->may need to d/c if BS drop with increase in activity.  -9/26 Will DC mealtime novolog to avoid hypoglycemia 10/4 well-controlled CBGs CBG (last 3)  Recent Labs    07/31/23 1631 07/31/23 2106 08/01/23 0706  GLUCAP 125* 116* 95   9. Anemia: Hgb has been trending down since July surgery. Wt loss and poor intake past 3 months. Will order anemia panel.   -9/27 Hgb was 9.8 yesterday, will add ferrous sulfate daily for iron deficiency -10/3 hemoglobin overall stable at 9.9 10. HTN: Monitor BP TID--continue Lisinopril and hydrochlorothiazide daily.   -9/26 BP soft, will discontinue hydrochlorothiazide  -10/4 blood pressure well-controlled    08/01/2023    5:01 AM 07/31/2023    8:43 PM 07/31/2023    1:01 PM  Vitals with BMI  Weight 121 lbs 8 oz    BMI 22.96    Systolic 135 128 409  Diastolic 78 77 67  Pulse 74 74 78    11. Constipation: Will add Senna to colace.  Likely opioid related. --Dose of MOM today as had been straining without results.  -9/26 sorbitol 60ml  ordered with BM  07/27/2023 improved after increase senna to 2 tab BID - Lg type 6 stool at 0231 and small type 1 @ 2021 10/2 LBM was on 9/30, Start miralax daily, start sorbitol 30ml prn 10/4 enema this morning with moderate results,  -addendum want to make sure he is not getting Ileus, abdominal x-ray  completed for stool burden- mod stool, mild air in small bowel and colon, 34g of miralax ordered, recheck KUB tomorrow AM,  Reglan started today, Full liquid diet started. Not eating much today.   12. PAD: On DAPT and Lipitor.    13. Screening for vitamin D deficiency: check vitamin D level tomor  14.  Hyponatremia.  Mild continue to monitor.  Appears to be chronic issue  -10/2 NA up to 133, Increase salt tabs to TID  -10/4 sodium 130 today, consider fluid restriction but will hold off as he is now on full liquid diet, recheck tomorrow  15.  Prerenal Azotemia.  -Creatinine and BUN stable at 0.59/14,   LOS: 9 days A FACE TO FACE EVALUATION WAS PERFORMED  Fanny Dance 08/01/2023, 8:40 AM

## 2023-08-01 NOTE — Progress Notes (Signed)
Physical Therapy Session Note  Patient Details  Name: Patrick [name] MRN: [id number] Date of Birth: 30-Apr-1946  Today's Date: 08/01/2023 PT Individual Time: 1000-1101 PT Individual Time Calculation (min): 61 min  Today's Date: 08/01/2023 PT Missed Time: 14 Minutes Missed Time Reason: Toileting  Short Term Goals: Week 1:  PT Short Term Goal 1 (Week 1): STG=LTG 2/2 ELOS  Skilled Therapeutic Interventions/Progress Updates:   Received pt sitting on commode with daughter present for family education but wife not present. Pt agreeable to PT treatment and reported feeling constipated despite receiving enema and suppository. Notified RN and RN reported disimpacting pt this morning.   Noted WC brake extendors missing - notified primary OT and CSW and placed coband around brakes for temporary solution. Pt stood from commode with RW and supervision and required min A for clothing management, but then had to sit back down and continue with BM. Went through pain interference questionnaire in preparation for discharge. Stood from commode with RW and supervision and required min A to pull pants over hips. Pt ambulated 100ft with RW and CGA to WC, then reported sudden urge to continue with BM. Quickly transported to bathroom and performed stand<>pivot on/off regular toilet with grab bar and CGA and removed clothing without assist. Pt soiled underwear/pants - removed soiled clothes with min A and donned brief (with max A) and clean shorts (without assist).   Sat in WC at sink and washed hand with set up assist. Pt's daughter able to manage WC parts without assist. Pt/daughter donned limb guard without assist and educated daughter on how to fold up WC to put into car. Pt then performed WC mobility 150 ft using BUE and mod I to main therapy gym. Stood with RW and close supervision and picked up object from floor using reacher and CGA for balance. Pt then performed x10 WC pushups with emphasis on UE strength.    Transported back to room due to urge to have another BM and daughter assisted with transferring pt onto toilet for BM - provided appropriate safety cues (to lock WC brakes). Concluded session with pt sitting on toilet left in care of daughter who is cleared to assist pt per safety plan. 14 minutes missed of skilled physical therapy due to toileting.   Therapy Documentation Precautions:  Precautions Precautions: Fall Precaution Comments: R LE BKA Required Braces or Orthoses: Other Brace Other Brace: R LE limb protector, shrinker Restrictions Weight Bearing Restrictions: Yes LLE Weight Bearing: Non weight bearing Other Position/Activity Restrictions: L limb protector re-ordered as it was lost from acute  Therapy/Group: Individual Therapy Marlana Salvage Zaunegger Blima Rich PT, DPT 08/01/2023, 7:08 AM

## 2023-08-01 NOTE — Progress Notes (Signed)
Patient was disimpacted with  moderate BM but has had abdominal cramping today. He was not able to eat breakfast and had minimal intake at lunch due to cramping. KUB done showing mild air distention of mid small bowel and colon. He does have decreased bowel sounds. No pain on palpation but had and episode of cramping pain--had to distinguish abdominal v/s rectal as reports one leads to the other and reports that it feels like a ball in the rectum. Nurse did disimpact him with digitial stimulation and had small to moderate amount of soft stool. Due to discomfort, lidocaine jelly used prior to resuming disimpaction but he was noted to have develop bleeding therefore this was discontinued. She administered dulcolax suppository and advised patient and family that this will assist in further helping to evacuate bowels and decrease discomfort. Did downgrade diet to liquids and started patient on IV reglan. Ordered CBC for am--d/c lovenox and wrote ordered to hold ASA/Plavix in am till seen by provider.  Discussed with patient and family that discharge tomorrow may be held depending on GI/ medical issues. On call provider updated. Nurse advised to monitor for any further rectal bleeding.

## 2023-08-02 ENCOUNTER — Inpatient Hospital Stay (HOSPITAL_COMMUNITY): Payer: Self-pay

## 2023-08-02 DIAGNOSIS — K5901 Slow transit constipation: Secondary | ICD-10-CM

## 2023-08-02 DIAGNOSIS — D649 Anemia, unspecified: Secondary | ICD-10-CM

## 2023-08-02 DIAGNOSIS — R739 Hyperglycemia, unspecified: Secondary | ICD-10-CM

## 2023-08-02 LAB — BASIC METABOLIC PANEL
Anion gap: 11 (ref 5–15)
BUN: 16 mg/dL (ref 8–23)
CO2: 20 mmol/L — ABNORMAL LOW (ref 22–32)
Calcium: 8.7 mg/dL — ABNORMAL LOW (ref 8.9–10.3)
Chloride: 100 mmol/L (ref 98–111)
Creatinine, Ser: 0.63 mg/dL (ref 0.61–1.24)
GFR, Estimated: >60 mL/min (ref >60)
Glucose, Bld: 90 mg/dL (ref 70–99)
Potassium: 3.6 mmol/L (ref 3.5–5.1)
Sodium: 131 mmol/L — ABNORMAL LOW (ref 135–145)

## 2023-08-02 LAB — CBC
HCT: 31.3 % — ABNORMAL LOW (ref 39.0–52.0)
Hemoglobin: 9.9 g/dL — ABNORMAL LOW (ref 13.0–17.0)
MCH: 26.9 pg (ref 26.0–34.0)
MCHC: 31.6 g/dL (ref 30.0–36.0)
MCV: 85.1 fL (ref 80.0–100.0)
Platelets: 262 10*3/uL (ref 150–400)
RBC: 3.68 MIL/uL — ABNORMAL LOW (ref 4.22–5.81)
RDW: 14.8 % (ref 11.5–15.5)
WBC: 5.3 10*3/uL (ref 4.0–10.5)
nRBC: 0 % (ref 0.0–0.2)

## 2023-08-02 LAB — GLUCOSE, CAPILLARY
Glucose-Capillary: 72 mg/dL (ref 70–99)
Glucose-Capillary: 112 mg/dL — ABNORMAL HIGH (ref 70–99)
Glucose-Capillary: 94 mg/dL (ref 70–99)
Glucose-Capillary: 122 mg/dL — ABNORMAL HIGH (ref 70–99)

## 2023-08-02 MED ORDER — METOCLOPRAMIDE HCL 5 MG PO TABS: 5.0000 mg | ORAL_TABLET | Freq: Four times a day (QID) | ORAL | Status: DC | PRN

## 2023-08-02 NOTE — Progress Notes (Signed)
Briefs changed twice. Earlier in the evening having dried maroon color blood and dark red clot in brief. Anus having some discoloration from skin around the area and bright pink. Endorses no GI issues outside of a intermittent pressure in the anus/rectum area. Second BM in the evening/night no blood observed in brief.

## 2023-08-02 NOTE — Progress Notes (Signed)
Endorses decrease in appetite. Eating about 25% of meal patient explains. Abdomen is soft and non-tender. Bowels sounds are audible and active in all four quadrants. Denies any pain at this time and no pain with abdomen/rectum area. Endorses having several episodes of flatulence throughout the day today.

## 2023-08-02 NOTE — Progress Notes (Signed)
PROGRESS NOTE   Subjective/Complaints:  Pt doing much better than yesterday! Had several medium to large BMs yesterday after disimpaction, and one this morning. Feels his stomach is back to normal, flatter, and no longer has pressure/pain. No ongoing n/v either. Wants to try to eat! Denies pain. Slept well. Urinating fine. Denies any other complaints or concerns. Just went for abd xray.   ROS: Patient denies fever, chills, CP, SOB, N/V/D, HA, new motor or sensory changes  + Residual limb pain-improved + Constipation-improved  Objective:   DG Abd 1 View  Result Date: 08/01/2023 CLINICAL DATA:  Constipation EXAM: ABDOMEN - 1 VIEW COMPARISON:  None Available. FINDINGS: Mild air distension central small bowel with mild air distension of colon. Moderate stool in the colon. No radiopaque calculi. IMPRESSION: Mild air distension of small bowel and colon with moderate stool in the colon. Electronically Signed   By: Jasmine Pang M.D.   On: 08/01/2023 16:36   Recent Labs    07/31/23 0718 08/02/23 1034  WBC 9.0 5.3  HGB 9.9* 9.9*  HCT 31.2* 31.3*  PLT 264 262    Recent Labs    08/01/23 0803 08/02/23 1034  NA 130* 131*  K 4.2 3.6  CL 99 100  CO2 19* 20*  GLUCOSE 109* 90  BUN 14 16  CREATININE 0.59* 0.63  CALCIUM 9.0 8.7*     Intake/Output Summary (Last 24 hours) at 08/02/2023 1224 Last data filed at 08/02/2023 0841 Gross per 24 hour  Intake 476 ml  Output --  Net 476 ml        Physical Exam: Vital Signs Blood pressure 138/81, pulse 75, temperature 98.1 F (36.7 C), resp. rate 18, height 5\' 1"  (1.549 m), weight 55.1 kg, SpO2 100%.   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 Following simple commands.  Fair sitting balance MSK: Left knee can extend to -5 from full extension--not reassessed today      Assessment/Plan: 1. Functional deficits which require 3+ hours per day of interdisciplinary therapy in a comprehensive inpatient rehab setting. Physiatrist is providing close team supervision and 24 hour management of active medical problems listed below. Physiatrist and rehab team continue to assess barriers to discharge/monitor patient progress toward functional and medical goals  Care Tool:  Bathing    Body parts bathed by patient: Right arm, Left arm, Chest, Abdomen, Front perineal area, Right upper leg, Left upper leg, Face, Buttocks, Right lower leg   Body parts bathed by helper: Right lower leg, Buttocks Body parts n/a: Left lower leg   Bathing assist Assist Level: Supervision/Verbal cueing     Upper Body Dressing/Undressing Upper body dressing  What is the patient wearing?: Pull over shirt    Upper body assist Assist Level: Independent with assistive device    Lower Body Dressing/Undressing Lower body dressing      What is the patient wearing?: Underwear/pull up, Pants, Ace wrap/stump shrinker     Lower body assist Assist for lower body dressing: Supervision/Verbal cueing     Toileting Toileting    Toileting assist Assist for toileting: Supervision/Verbal cueing     Transfers Chair/bed transfer  Transfers assist     Chair/bed transfer assist level: Supervision/Verbal cueing     Locomotion Ambulation   Ambulation assist      Assist level: Supervision/Verbal  cueing Assistive device: Walker-rolling Max distance: 60   Walk 10 feet activity   Assist     Assist level: Supervision/Verbal cueing Assistive device: Walker-rolling   Walk 50 feet activity   Assist Walk 50 feet with 2 turns activity did not occur: Safety/medical concerns  Assist level: Supervision/Verbal cueing Assistive device: Walker-rolling    Walk 150 feet activity   Assist Walk 150 feet activity did not occur: Safety/medical concerns (fatigue)         Walk 10 feet on uneven surface  activity   Assist Walk 10 feet on uneven surfaces activity did not occur: Safety/medical concerns (fatigue)         Wheelchair     Assist Is the patient using a wheelchair?: Yes Type of Wheelchair: Manual    Wheelchair assist level: Independent Max wheelchair distance: 156ft    Wheelchair 50 feet with 2 turns activity    Assist        Assist Level: Independent   Wheelchair 150 feet activity     Assist      Assist Level: Independent   Blood pressure 138/81, pulse 75, temperature 98.1 F (36.7 C), resp. rate 18, height 5\' 1"  (1.549 m), weight 55.1 kg, SpO2 100%.   Medical Problem List and Plan: 1. 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 Advised patient to check out and in at nursing station when using grounds pass   -Expected discharge 10/5, will recheck abd xray and BMP tomorrow before discharge  -08/02/23 doing much better, will advance diet and switch reglan to PO PRN, if tolerating tomorrow then may be able to go home.     2. Impaired mobility: continue lovenox             -antiplatelet therapy: DAPT 3.  Pain Management: oxycodone--Oxycontin 10 mg BID added (using oxy 15 TID in past 24 hours). as reports pain nagging and does not ask. Daughter has to advocate for him.              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 Mood/Behavior/Sleep:  LCSW to follow for evaluation and support.              -antipsychotic agents: N/A             --sleeping well except for pain 5. Neuropsych/cognition: This patient may not be fully capable of making decisions on his own behalf. 6. Skin/Wound Care:  Routine pressure relief measures. Continue wound VAC for 7-10 days.   -10/1 wound VAC has been removed, monitor incision Gr 1 decub right heel prevalon boot ordered, continue to monitor  7. Fluids/Electrolytes/Nutrition: Monitor I/O. Family concerned about his recent wt  loss/poor intake             --ensure max and beneprotein added to promote healing.              --continue Zinc, Juven and Vitamin C             --diet down graded to D2 (per family )due to edentulous state. (They are willing to further modify to puree if D2 still difficult to chew.)  -see diet discussion under #11 (constipation) 8. T2DM: Monitor BS ac/hs. Continue Metformin 500 mg bid and Jardiance 25 mg. Will ask daughter to bring in Trulicity.  --Continue   novolog 4 units TID meal coverage for now-->may need to d/c if BS drop with increase in activity.  -9/26 Will DC mealtime novolog to avoid  hypoglycemia -10/4-5 well-controlled CBGs CBG (last 3)  Recent Labs    08/01/23 1635 08/01/23 2119 08/02/23 0608  GLUCAP 99 94 72   9. Anemia: Hgb has been trending down since July surgery. Wt loss and poor intake past 3 months. Will order anemia panel.   -9/27 Hgb was 9.8 yesterday, will add ferrous sulfate daily for iron deficiency -10/3 hemoglobin overall stable at 9.9 -08/02/23 hgb stable 9.9 10. HTN: Monitor BP TID--continue Lisinopril and hydrochlorothiazide daily.   -9/26 BP soft, will discontinue hydrochlorothiazide  -10/4-5 blood pressure overall well-controlled Vitals:   07/30/23 2054 07/31/23 0646 07/31/23 1301 07/31/23 1954  BP: 139/81 131/78 120/67 129/79   07/31/23 2043 08/01/23 0501 08/01/23 1250 08/01/23 1927  BP: 128/77 135/78 (!) 133/92 135/73   08/02/23 0004 08/02/23 0010 08/02/23 0526 08/02/23 1203  BP: (!) 166/84 (!) 166/84 133/77 138/81     11. Constipation: Will add Senna to colace.  Likely opioid related. --Dose of MOM today as had been straining without results.  -9/26 sorbitol 60ml ordered with BM  07/27/2023 improved after increase senna to 2 tab BID - Lg type 6 stool at 0231 and small type 1 @ 2021 10/2 LBM was on 9/30, Start miralax daily, start sorbitol 30ml prn 10/4 enema this morning with moderate results,  -addendum want to make sure he is not getting Ileus, abdominal x-ray  completed for stool burden- mod stool, mild air in small bowel and colon, 34g of miralax ordered, recheck KUB tomorrow AM,  IV Reglan started today, Full liquid diet started. Not eating much today.   -08/02/23 Pt doing significantly better this morning, had multiple BMs yesterday/today, abd exam benign, labs today stable/unremarkable, abd xray from today pending but looks better than yesterday, less stool burden in colon/rectum, still a lot of gas.  Doubt ileus. Will advance diet, switch reglan to PO PRN, and if improving on this then may go home tomorrow.   12. PAD: On DAPT and  Lipitor.    13. Screening for vitamin D deficiency: check vitamin D level tomor  14.  Hyponatremia.  Mild continue to monitor.  Appears to be chronic issue  -10/2 NA up to 133, Increase salt tabs to TID  -10/4 sodium 130 today, consider fluid restriction but will hold off as he is now on full liquid diet, recheck tomorrow  -08/02/23 Na stable 131  15.  Prerenal Azotemia.  -Creatinine and BUN stable at 0.59/14,   -08/02/23 Cr 0.63/BUN 16  LOS: 10 days A FACE TO FACE EVALUATION WAS PERFORMED  [address] 08/02/2023, 12:24 PM

## 2023-08-03 LAB — GLUCOSE, CAPILLARY: Glucose-Capillary: 92 mg/dL (ref 70–99)

## 2023-08-03 NOTE — Progress Notes (Signed)
PROGRESS NOTE   Subjective/Complaints:  Pt doing great today, ready to go! Appetite improved, feels great, ate all of his breakfast and family brought [employer] as well. Had a BM this morning again.  Denies pain. Slept great. Urinating fine. Denies any other complaints or concerns.   ROS: Patient denies fever, chills, CP, SOB, N/V/D, HA, new motor or sensory changes  + Residual limb pain-improved + Constipation-improved  Objective:   DG Abd 2 Views  Result Date: 08/02/2023 CLINICAL DATA:  Abdominal pain. EXAM: ABDOMEN - 2 VIEW COMPARISON:  One-view abdomen 08/01/2023. FINDINGS: Dilated loops of large and small bowel are present fluid levels are present. Gas is present in the distal sigmoid colon and rectum. Mild degenerative changes are present in the lower lumbar spine. IMPRESSION: Slight progression of dilated loops of large and small bowel with fluid levels. Findings are concerning for ileus or obstruction. Electronically Signed   By: Marin Roberts M.D.   On: 08/02/2023 12:56   DG Abd 1 View  Result Date: 08/01/2023 CLINICAL DATA:  Constipation EXAM: ABDOMEN - 1 VIEW COMPARISON:  None Available. FINDINGS: Mild air distension central small bowel with mild air distension of colon. Moderate stool in the colon. No radiopaque calculi. IMPRESSION: Mild air distension of small bowel and colon with moderate stool in the colon. Electronically Signed   By: Jasmine Pang M.D.   On: 08/01/2023 16:36   Recent Labs    08/02/23 1034  WBC 5.3  HGB 9.9*  HCT 31.3*  PLT 262    Recent Labs    08/01/23 0803 08/02/23 1034  NA 130* 131*  K 4.2 3.6  CL 99 100  CO2 19* 20*  GLUCOSE 109* 90  BUN 14 16  CREATININE 0.59* 0.63  CALCIUM 9.0 8.7*     Intake/Output Summary (Last 24 hours) at 08/03/2023 0946 Last data filed at 08/03/2023 0745 Gross per 24 hour  Intake 827 ml  Output --  Net 827 ml        Physical Exam: Vital  Signs Blood pressure (!) 161/83, pulse 73, temperature 98.3 F (36.8 C), temperature source Oral, resp. rate 20, height 5\' 1"  (1.549 m), weight 55.1 kg, SpO2 98%.   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 Following simple commands.  Fair sitting balance MSK: Left knee can extend to -5 from full extension--not reassessed today      Assessment/Plan: 1. Functional deficits which require 3+ hours per day of interdisciplinary therapy in a comprehensive inpatient rehab setting. Physiatrist is providing close team supervision and 24 hour management of active medical problems listed below. Physiatrist and rehab team continue to assess barriers to discharge/monitor  patient progress toward functional and medical goals  Care Tool:  Bathing    Body parts bathed by patient: Right arm, Left arm, Chest, Abdomen, Front perineal area, Right upper leg, Left upper leg, Face, Buttocks, Right lower leg   Body parts bathed by helper: Right lower leg, Buttocks Body parts n/a: Left lower leg   Bathing assist Assist Level: Supervision/Verbal cueing     Upper Body Dressing/Undressing Upper body dressing   What is the patient wearing?: Pull over shirt    Upper body assist Assist Level: Independent with assistive device    Lower Body Dressing/Undressing Lower body dressing      What is the patient  wearing?: Underwear/pull up, Pants, Ace wrap/stump shrinker     Lower body assist Assist for lower body dressing: Supervision/Verbal cueing     Toileting Toileting    Toileting assist Assist for toileting: Supervision/Verbal cueing     Transfers Chair/bed transfer  Transfers assist     Chair/bed transfer assist level: Supervision/Verbal cueing     Locomotion Ambulation   Ambulation assist      Assist level: Supervision/Verbal cueing Assistive device: Walker-rolling Max distance: 60   Walk 10 feet activity   Assist     Assist level: Supervision/Verbal cueing Assistive device: Walker-rolling   Walk 50 feet activity   Assist Walk 50 feet with 2 turns activity did not occur: Safety/medical concerns  Assist level: Supervision/Verbal cueing Assistive device: Walker-rolling    Walk 150 feet activity   Assist Walk 150 feet activity did not occur: Safety/medical concerns (fatigue)         Walk 10 feet on uneven surface  activity   Assist Walk 10 feet on uneven surfaces activity did not occur: Safety/medical concerns (fatigue)         Wheelchair     Assist Is the patient using a wheelchair?: Yes Type of Wheelchair: Manual    Wheelchair assist level: Independent Max wheelchair distance: 131ft    Wheelchair 50 feet with 2 turns activity    Assist        Assist Level: Independent   Wheelchair 150 feet activity     Assist      Assist Level: Independent   Blood pressure (!) 161/83, pulse 73, temperature 98.3 F (36.8 C), temperature source Oral, resp. rate 20, height 5\' 1"  (1.549 m), weight 55.1 kg, SpO2 98%.   Medical Problem List and Plan: 1. 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 Advised patient to check out and in at nursing station when using grounds pass   -Expected discharge 10/5, will recheck abd xray and BMP tomorrow before discharge  -08/02/23 doing much better, will advance diet and switch reglan to PO PRN, if tolerating tomorrow then may be able to go home.   -08/03/23 D/C today, meds reviewed.     2. Impaired mobility: continue lovenox             -antiplatelet therapy: DAPT 3. Pain Management: oxycodone--Oxycontin 10 mg BID added (using oxy 15 TID in past 24 hours). as  reports pain nagging and does not ask. Daughter has to advocate for him.              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 Mood/Behavior/Sleep:  LCSW to follow for evaluation and support.              -antipsychotic agents: N/A             --sleeping well except for pain 5. Neuropsych/cognition: This patient may not be fully capable of making decisions on his own behalf. 6. Skin/Wound Care:  Routine pressure relief measures. Continue wound VAC for 7-10 days.   -10/1 wound VAC has been removed, monitor incision Gr 1 decub right heel prevalon boot ordered, continue to monitor  7. Fluids/Electrolytes/Nutrition: Monitor I/O. Family concerned about his recent wt  loss/poor intake             --ensure max and beneprotein added to promote healing.               --continue Zinc, Juven and Vitamin C             --diet down graded to D2 (per family )due to edentulous state. (They are willing to further modify to puree if D2 still difficult to chew.)  -see diet discussion under #11 (constipation) 8. T2DM: Monitor BS ac/hs. Continue Metformin 500 mg bid and Jardiance 25 mg. Will ask daughter to bring in Trulicity.  --Continue   novolog 4 units TID meal coverage for now-->may need to d/c if BS drop with increase in activity.  -9/26 Will DC mealtime novolog to avoid hypoglycemia -10/4-6 well-controlled CBGs CBG (last 3)  Recent Labs    08/02/23 1650 08/02/23 2112 08/03/23 0622  GLUCAP 94 122* 92   9. Anemia: Hgb has been trending down since July surgery. Wt loss and poor intake past 3 months. Will order anemia panel.   -9/27 Hgb was 9.8 yesterday, will add ferrous sulfate daily for iron deficiency -10/3 hemoglobin overall stable at 9.9 -08/02/23 hgb stable 9.9 10. HTN: Monitor BP TID--continue Lisinopril and hydrochlorothiazide daily.   -9/26 BP soft, will discontinue hydrochlorothiazide  -10/4-5 blood pressure overall well-controlled  -08/03/23 BPs mostly controlled, a couple 150-160s SBP; f/up outpatient, doubt needing for alteration in meds today Vitals:   07/31/23 2043 08/01/23 0501 08/01/23 1250 08/01/23 1927  BP: 128/77 135/78 (!) 133/92 135/73   08/02/23 0004 08/02/23 0010 08/02/23 0526 08/02/23 1203  BP: (!) 166/84 (!) 166/84 133/77 138/81   08/02/23 1247 08/02/23 1930 08/03/23 0006 08/03/23 0515  BP: 132/66 136/79 (!) 155/85 (!) 161/83     11. Constipation: Will add Senna to colace.  Likely opioid related. --Dose of MOM today as had been straining without results.  -9/26 sorbitol 60ml ordered with BM  07/27/2023 improved after increase senna to 2 tab BID - Lg type 6 stool at 0231 and small type 1 @  2021 10/2 LBM was on 9/30, Start miralax daily, start sorbitol 30ml prn 10/4 enema this morning with moderate results,  -addendum want to  make sure he is not getting Ileus, abdominal x-ray  completed for stool burden- mod stool, mild air in small bowel and colon, 34g of miralax ordered, recheck KUB tomorrow AM,  IV Reglan started today, Full liquid diet started. Not eating much today.   -08/02/23 Pt doing significantly better this morning, had multiple BMs yesterday/today, abd exam benign, labs today stable/unremarkable, abd xray from today pending but looks better than yesterday, less stool burden in colon/rectum, still a lot of gas. Doubt ileus. Will advance diet, switch reglan to PO PRN, and if improving on this then may go home tomorrow.   -08/03/23 pt much better, moving bowels, eating regular food, hasn't needed reglan, symptoms fully resolved. Ok to go home.   12. PAD: On DAPT and Lipitor.    13. Screening for vitamin D deficiency: check vitamin D level tomor  14.  Hyponatremia.  Mild continue to monitor.  Appears to be chronic issue  -10/2 NA up to 133, Increase salt tabs to TID  -10/4 sodium 130 today, consider fluid restriction but will hold off as he is now on full liquid diet, recheck tomorrow  -08/02/23 Na stable 131  15.  Prerenal Azotemia.  -Creatinine and BUN stable at 0.59/14,   -08/02/23 Cr 0.63/BUN 16  LOS: 11 days A FACE TO FACE EVALUATION WAS PERFORMED  [address] 08/03/2023, 9:46 AM

## 2023-08-03 NOTE — Progress Notes (Signed)
Back support cushion that is part of patients new wheelchair was left behind after discharge.  Daughter Byrd Hesselbach notified of equipment piece left behind. Byrd Hesselbach stated she would pick up cushion from 4W nurses station. Cushion located in oxygen room with patient label attached.

## 2023-08-04 ENCOUNTER — Other Ambulatory Visit (HOSPITAL_COMMUNITY): Payer: Self-pay

## 2023-08-04 ENCOUNTER — Telehealth: Payer: Self-pay

## 2023-08-04 NOTE — Transitions of Care (Post Inpatient/ED Visit) (Signed)
08/04/2023  Name: Plato Lesmeister MRN: [id number] DOB: 30-Jul-1946  Today's TOC FU Call Status: Today's TOC FU Call Status:: Unsuccessful Call (1st Attempt) Unsuccessful Call (1st Attempt) Date: 08/04/23  Attempted to reach the patient regarding the most recent Inpatient/ED visit.  Follow Up Plan: Additional outreach attempts will be made to reach the patient to complete the Transitions of Care (Post Inpatient/ED visit) call.   Signature  Robyne Peers, RN

## 2023-08-05 ENCOUNTER — Telehealth: Payer: Self-pay

## 2023-08-05 ENCOUNTER — Telehealth: Payer: Self-pay | Admitting: Orthopedic Surgery

## 2023-08-05 DIAGNOSIS — K5903 Drug induced constipation: Secondary | ICD-10-CM | POA: Insufficient documentation

## 2023-08-05 DIAGNOSIS — R52 Pain, unspecified: Secondary | ICD-10-CM | POA: Insufficient documentation

## 2023-08-05 DIAGNOSIS — T402X5A Adverse effect of other opioids, initial encounter: Secondary | ICD-10-CM | POA: Insufficient documentation

## 2023-08-05 DIAGNOSIS — E871 Hypo-osmolality and hyponatremia: Secondary | ICD-10-CM | POA: Insufficient documentation

## 2023-08-05 DIAGNOSIS — D62 Acute posthemorrhagic anemia: Secondary | ICD-10-CM | POA: Insufficient documentation

## 2023-08-05 NOTE — Telephone Encounter (Signed)
Called patient for an appt next week with [name] left message on voicemail to call back. Patrick [name] [phone]

## 2023-08-05 NOTE — Discharge Summary (Signed)
Physician Discharge Summary  Patient ID: Patrick [name] MRN: [id number] DOB/AGE: Sep 27, 1946 77 y.o.  Admit date: 07/23/2023 Discharge date: 08/03/2023  Discharge Diagnoses:  Principal Problem:   Below-knee amputation of left lower extremity (HCC) Active Problems:   ABLA (acute blood loss anemia)   Opioid-induced constipation   Phantom pain   Hyponatremia   Discharged Condition: stable  Significant Diagnostic Studies: DG Abd 2 Views  Result Date: 08/02/2023 CLINICAL DATA:  Abdominal pain. EXAM: ABDOMEN - 2 VIEW COMPARISON:  One-view abdomen 08/01/2023. FINDINGS: Dilated loops of large and small bowel are present fluid levels are present. Gas is present in the distal sigmoid colon and rectum. Mild degenerative changes are present in the lower lumbar spine. IMPRESSION: Slight progression of dilated loops of large and small bowel with fluid levels. Findings are concerning for ileus or obstruction. Electronically Signed   By: Marin Roberts M.D.   On: 08/02/2023 12:56   DG Abd 1 View  Result Date: 08/01/2023 CLINICAL DATA:  Constipation EXAM: ABDOMEN - 1 VIEW COMPARISON:  None Available. FINDINGS: Mild air distension central small bowel with mild air distension of colon. Moderate stool in the colon. No radiopaque calculi. IMPRESSION: Mild air distension of small bowel and colon with moderate stool in the colon. Electronically Signed   By: Jasmine Pang M.D.   On: 08/01/2023 16:36   PERIPHERAL VASCULAR CATHETERIZATION  Result Date: 07/16/2023 See surgical note for result.   Labs:  Basic Metabolic Panel: Recent Labs  Lab 07/30/23 0511 07/31/23 0718 08/01/23 0803 08/02/23 1034  NA 133* 131* 130* 131*  K 4.1 4.0 4.2 3.6  CL 102 100 99 100  CO2 25 23 19* 20*  GLUCOSE 108* 111* 109* 90  BUN 26* 20 14 16   CREATININE 0.56* 0.60* 0.59* 0.63  CALCIUM 8.6* 8.6* 9.0 8.7*    CBC:    Latest Ref Rng & Units 08/02/2023   10:34 AM 07/31/2023    7:18 AM 07/28/2023    6:51 AM   CBC  WBC 4.0 - 10.5 K/uL 5.3  9.0  6.3   Hemoglobin 13.0 - 17.0 g/dL 9.9  9.9  9.8   Hematocrit 39.0 - 52.0 % 31.3  31.2  30.6   Platelets 150 - 400 K/uL 262  264  275      CBG: Recent Labs  Lab 08/02/23 0608 08/02/23 1226 08/02/23 1650 08/02/23 2112 08/03/23 0622  GLUCAP 72 112* 94 122* 92    Brief HPI:   Patrick [name] is a 77 y.o. male with history of T2DM, HTN, ischemic left toe ulcers, PAD s/p angioplasty and toe amputation but continued to have ischemic pain and failure of attempts at limb salvage. 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 Lajoyce Corners. Post op had issues with pain control as well as poor intake due to edentulous state. Therapy was working with patient who required min assist overall. He was independent prior to toe amputation a few months ago. CIR was recommended due to functional decline.    Hospital Course: Patrick [name] was admitted to rehab 07/23/2023 for inpatient therapies to consist of PT  and OT at least three hours five days a week. Past admission physiatrist, therapy team and rehab RN have worked together to provide customized collaborative inpatient rehab. His blood pressures were monitored on TID basis and has been well controlled. Diet was downgraded to D2 due to edentulous state and family request. Ensure max and beneprotein was added due to low protein stores and to promote wound  healing. Wound VAC was removed on  09/30. His diabetes has been monitored with ac/hs CBG checks and SSI was use prn for tighter BS control. Blood sugars have been reasonably controlled.  Check of electrolytes showed pre renal azotemia has resolved and persistent hyponatremia despite addition of salt tabs.  Recommend follow up BMET in 1-2 weeks to monitor for improvement. Prevalon boot was ordered for pressure relief for Stage 1 decub on right heel.   Family expressed concerns regarding pain control and Oxycontin was added to help with activity tolerance and d/c by 10/03.  Oxycodone 5 mg was scheduled daily in am to and Gabapentin was titrated upwards to help with phantom pain. He has also had issues with constipation and bowel program has been augmented during his stay. He report abdominal discomfort due to rectal spasms despite being disimpacted once. KUB showed concerns of ileus and he was downgraded to liquids with addition of reglan X 24 hours.  Scheduled oxycodone was d/c with recommendations to use narcotics for severe pain only. He was disimpacted 2 additional times with second attempt aborted due to rectal bleeding secondary to irritation.  Lovenox was d/c with follow up CBC showing stable H/H and rectal bleeding had resolved by next day. Follow up KUB showed concerns of ileus but patient was able to tolerated po's without pain and had had additional BMs.     Rehab course: During patient's stay in rehab weekly team conferences were held to monitor patient's progress, set goals and discuss barriers to discharge. At admission, patient required mod assist with basic self care tasks and min assist with mobility. He  has had improvement in activity tolerance, balance, postural control as well as ability to compensate for deficits. He is able to complete ADL tasks with supervision. He requires supervision for transfers and to ambulate 80' with RW as well as cues for safety. He is able to climb 5 stairs with min assist. Family education has been completed.   Discharge disposition: 01-Home or Self Care  Diet: Carb Modified.   Special Instructions: Recommend repeat BMET in 7-10 days to monitor Sodium and renal status Repeat CBC in 1-2 weeks to monitor ABLA  Discharge Instructions     Ambulatory referral to Physical Medicine Rehab   Complete by: As directed       Allergies as of 08/03/2023   No Known Allergies      Medication List     STOP taking these medications    HumaLOG 100 UNIT/ML injection Generic drug: insulin lispro   hydrochlorothiazide 25 MG  tablet Commonly known as: HYDRODIURIL   nitroGLYCERIN 0.2 mg/hr patch Commonly known as: NITRODUR - Dosed in mg/24 hr       TAKE these medications    acetaminophen 500 MG tablet Commonly known as: TYLENOL Take 1,000 mg by mouth every 8 (eight) hours as needed (pain.).   Aspirin Low Dose 81 MG tablet Generic drug: aspirin EC Take 1 tablet (81 mg total) by mouth daily. Swallow whole.   atorvastatin 80 MG tablet Commonly known as: LIPITOR Tome 1 tableta (80 mg en total) por va oral diariamente. (Take 1 tablet (80 mg total) by mouth daily.)   clopidogrel 75 MG tablet Commonly known as: Plavix Tome 1 tableta (75 mg en total) por va oral diariamente. (Take 1 tablet (75 mg total) by mouth daily.)   docusate sodium 100 MG capsule Commonly known as: COLACE Tome 1 cpsula (100 mg en total) por va oral diariamente. (Take 1 capsule (100  mg total) by mouth daily.)   FeroSul 325 (65 Fe) MG tablet Generic drug: ferrous sulfate Tome 1 tableta (325 mg en total) por va oral diariamente con el desayuno. (Take 1 tablet (325 mg total) by mouth daily with breakfast.)   gabapentin 100 MG capsule Commonly known as: NEURONTIN Take 2 capsules (200 mg total) by mouth 3 (three) times daily.   Jardiance 25 MG Tabs tablet Generic drug: empagliflozin Tome 1 tableta (25 mg en total) por va oral diariamente antes de desayunar. (Take 1 tablet (25 mg total) by mouth daily before breakfast.)   lisinopril 20 MG tablet Commonly known as: ZESTRIL Take 1 tablet (20 mg total) by mouth every evening.   melatonin 5 MG Tabs Take 1 tablet (5 mg total) by mouth at bedtime as needed.   metFORMIN 500 MG 24 hr tablet Commonly known as: GLUCOPHAGE-XR Take 1 tablet (500 mg total) by mouth 2 (two) times daily before a meal.   oxyCODONE 5 MG immediate release tablet--Rx# 28 pills Commonly known as: Oxy IR/ROXICODONE Take 1 tablet (5 mg total) by mouth every 6 (six) hours as needed for severe  pain. Notes to patient: Use only for severe pain as this can be very constipating.    pantoprazole 40 MG tablet Commonly known as: PROTONIX Tome 1 tableta (40 mg en total) por va oral diariamente. (Take 1 tablet (40 mg total) by mouth daily.)   polyethylene glycol powder 17 GM/SCOOP powder Commonly known as: GLYCOLAX/MIRALAX Take 17 g by mouth 2 (two) times daily.   Senna-S 8.6-50 MG tablet Generic drug: senna-docusate Take 2 tablets by mouth 2 (two) times daily.   sodium chloride 0.65 % Soln nasal spray Commonly known as: OCEAN Place 1 spray into both nostrils as needed for congestion.   sodium chloride 1 g tablet Take 1 tablet (1 g total) by mouth 3 (three) times daily with meals.   TechLite Plus Pen Needles 32G X 4 MM Misc Generic drug: Insulin Pen Needle use up to four times daily as directed   Trulicity 0.75 MG/0.5ML Sopn Generic drug: Dulaglutide Inject 0.75 mg into the skin once a week.   vitamin C 1000 MG tablet Take 1 tablet (1,000 mg total) by mouth daily.        Follow-up Information     Grayce Sessions, NP. Call.   Specialty: Internal Medicine Why: Monday for post hospital follow up Contact information: [address] [place] [address] [phone]         Fanny Dance, MD Follow up.   Specialty: Physical Medicine and Rehabilitation Why: office will call you with follow up appointment Contact information: [address] [place] [address] [phone]         Nadara Mustard, MD. Call.   Specialty: Orthopedic Surgery Why: Monday for post op check up Contact information: [address] [place] [address] [phone]                 Signed: Jacquelynn Cree 08/05/2023, 5:47 PM

## 2023-08-05 NOTE — Transitions of Care (Post Inpatient/ED Visit) (Signed)
08/05/2023  Name: Patrick [name] MRN: [id number] DOB: 06-27-1946  Today's TOC FU Call Status: Today's TOC FU Call Status:: Unsuccessful Call (2nd Attempt) Unsuccessful Call (1st Attempt) Date: 08/04/23 Unsuccessful Call (2nd Attempt) Date: 08/05/23  Attempted to reach the patient regarding the most recent Inpatient/ED visit.  Follow Up Plan: Additional outreach attempts will be made to reach the patient to complete the Transitions of Care (Post Inpatient/ED visit) call.   Signature  Robyne Peers, RN

## 2023-08-06 ENCOUNTER — Telehealth: Payer: Self-pay

## 2023-08-06 NOTE — Transitions of Care (Post Inpatient/ED Visit) (Signed)
08/06/2023  Name: Patrick [name] MRN: [id number] DOB: [birth year]  Today's TOC FU Call Status: Today's TOC FU Call Status:: Unsuccessful Call (3rd Attempt) Unsuccessful Call (1st Attempt) Date: 08/04/23 Unsuccessful Call (2nd Attempt) Date: 08/05/23 Unsuccessful Call (3rd Attempt) Date: 08/06/23  Attempted to reach the patient regarding the most recent Inpatient/ED visit.  Follow Up Plan: No further outreach attempts will be made at this time. We have been unable to contact the patient.  Signature  Robyne Peers, RN

## 2023-08-11 ENCOUNTER — Telehealth: Payer: Self-pay | Admitting: Orthopedic Surgery

## 2023-08-11 NOTE — Telephone Encounter (Signed)
Called patient to schedule an appt with [name] left message on voicemail to give a call back please. Patrick [name]

## 2023-08-18 ENCOUNTER — Ambulatory Visit (INDEPENDENT_AMBULATORY_CARE_PROVIDER_SITE_OTHER): Payer: Self-pay | Admitting: Orthopedic Surgery

## 2023-08-18 ENCOUNTER — Encounter: Payer: Self-pay | Admitting: Orthopedic Surgery

## 2023-08-18 DIAGNOSIS — Z89512 Acquired absence of left leg below knee: Secondary | ICD-10-CM

## 2023-08-18 DIAGNOSIS — I739 Peripheral vascular disease, unspecified: Secondary | ICD-10-CM

## 2023-08-18 DIAGNOSIS — I998 Other disorder of circulatory system: Secondary | ICD-10-CM

## 2023-08-18 DIAGNOSIS — S88112A Complete traumatic amputation at level between knee and ankle, left lower leg, initial encounter: Secondary | ICD-10-CM

## 2023-08-18 NOTE — Progress Notes (Signed)
Office Visit Note   Patient: Patrick [name]           Date of Birth: Jul 16, 1946           MRN: [id number] Visit Date: 08/18/2023              Requested by: Grayce Sessions, NP [address] [place],  [place] [address] PCP: Grayce Sessions, NP  Chief Complaint  Patient presents with   Left Leg - Routine Post Op    07/16/23 left BKA      HPI: Patient is a 77 year old gentleman who is 4 weeks status post left below-knee amputation.  Patient states he has been having pain in the residual limb.  Patient states he fell 2 weeks ago but did not hit his leg.  Assessment & Plan: Visit Diagnoses:  1. PAD (peripheral artery disease) (HCC)   2. Below-knee amputation of left lower extremity, initial encounter (HCC)   3. Ischemia of left lower extremity     Plan: Continue with wound care and compression.  Staples harvested today.  Follow-Up Instructions: No follow-ups on file.   Ortho Exam  Patient is alert, oriented, no adenopathy, well-dressed, normal affect, normal respiratory effort. Examination patient has superficial ischemic ulceration around the residual limb.  There is no cellulitis no drainage no wound dehiscence.  Staples harvested today.  Imaging: No results found. No images are attached to the encounter.  Labs: Lab Results  Component Value Date   HGBA1C 6.8 (H) 07/04/2023   HGBA1C 8.9 (A) 03/06/2023   HGBA1C 7.5 (A) 12/05/2022   ESRSEDRATE 115 (H) 10/23/2021   CRP 15.1 (H) 10/23/2021   REPTSTATUS 10/29/2021 FINAL 10/24/2021   REPTSTATUS 10/29/2021 FINAL 10/24/2021   GRAMSTAIN  10/24/2021    MODERATE WBC PRESENT,BOTH PMN AND MONONUCLEAR FEW GRAM POSITIVE COCCI    GRAMSTAIN  10/24/2021    RARE WBC PRESENT, PREDOMINANTLY PMN RARE GRAM POSITIVE COCCI IN CLUSTERS    CULT  10/24/2021    FEW STAPHYLOCOCCUS AUREUS NO ANAEROBES ISOLATED Performed at [facility], [address], [place], [place] [address]    CULT  10/24/2021    MODERATE  STAPHYLOCOCCUS AUREUS NO ANAEROBES ISOLATED Performed at [facility], [address] [place], [place] [address]    [facility] STAPHYLOCOCCUS AUREUS 10/24/2021   LABORGA STAPHYLOCOCCUS AUREUS 10/24/2021     Lab Results  Component Value Date   ALBUMIN 2.9 (L) 07/24/2023   ALBUMIN 5.0 (H) 09/04/2022   ALBUMIN 2.5 (L) 10/25/2021    Lab Results  Component Value Date   MG 2.3 07/24/2023   MG 2.0 10/26/2021   MG 2.3 10/25/2021   Lab Results  Component Value Date   VD25OH 38.91 07/24/2023    No results found for: "PREALBUMIN"    Latest Ref Rng & Units 08/02/2023   10:34 AM 07/31/2023    7:18 AM 07/28/2023    6:51 AM  CBC EXTENDED  WBC 4.0 - 10.5 K/uL 5.3  9.0  6.3   RBC 4.22 - 5.81 MIL/uL 3.68  3.76  3.59   Hemoglobin 13.0 - 17.0 g/dL 9.9  9.9  9.8   HCT 21.3 - 52.0 % 31.3  31.2  30.6   Platelets 150 - 400 K/uL 262  264  275      There is no height or weight on file to calculate BMI.  Orders:  No orders of the defined types were placed in this encounter.  No orders of the defined types were  placed in this encounter.    Procedures: No procedures performed  Clinical Data: No additional findings.  ROS:  All other systems negative, except as noted in the HPI. Review of Systems  Objective: Vital Signs: There were no vitals taken for this visit.  Specialty Comments:  No specialty comments available.  PMFS History: Patient Active Problem List   Diagnosis Date Noted   ABLA (acute blood loss anemia) 08/05/2023   Opioid-induced constipation 08/05/2023   Phantom pain 08/05/2023   Hyponatremia 08/05/2023   Below-knee amputation of left lower extremity (HCC) 07/23/2023   Gangrene of left foot (HCC) 07/16/2023   S/P BKA (below knee amputation) unilateral, left (HCC) 07/16/2023   Gangrene of toe of left foot (HCC) 07/04/2023   Amputated toe of left foot (HCC) 07/04/2023   Alcohol intoxication with delirium (HCC) 09/04/2022   Prepatellar bursitis, left knee  11/13/2021   Infection of left knee (HCC) 10/24/2021   Cellulitis of knee 10/23/2021   Uncontrolled type 2 diabetes mellitus with hyperglycemia (HCC) 10/23/2021   Past Medical History:  Diagnosis Date   Diabetes mellitus without complication (HCC)    History of alcohol abuse    quit in 2015   Hypertension    Peripheral vascular disease (HCC)     Family History  Problem Relation Age of Onset   Diabetes Sister    Cancer Sister    Diabetes Brother     Past Surgical History:  Procedure Laterality Date   ABDOMINAL AORTOGRAM W/LOWER EXTREMITY Left 05/16/2023   Procedure: ABDOMINAL AORTOGRAM W/LOWER EXTREMITY;  Surgeon: Leonie Douglas, MD;  Location: [facility];  Service: Cardiovascular;  Laterality: Left;   AMPUTATION Left 07/04/2023   Procedure: LEFT 4TH TOE AMPUTATION;  Surgeon: Nadara Mustard, MD;  Location: [facility] OR;  Service: Orthopedics;  Laterality: Left;   AMPUTATION Left 07/16/2023   Procedure: LEFT BELOW KNEE AMPUTATION;  Surgeon: Nadara Mustard, MD;  Location: [facility] OR;  Service: Orthopedics;  Laterality: Left;   APPLICATION OF WOUND VAC Left 07/16/2023   Procedure: APPLICATION OF WOUND VAC;  Surgeon: Nadara Mustard, MD;  Location: [facility] OR;  Service: Orthopedics;  Laterality: Left;   I & D EXTREMITY Left 10/24/2021   Procedure: IRRIGATION AND DEBRIDEMENT OF KNEE AND EXCISION PRE-PATELLA BURSA;  Surgeon: Ernestina Columbia, MD;  Location: [facility] OR;  Service: Orthopedics;  Laterality: Left;   PERIPHERAL VASCULAR BALLOON ANGIOPLASTY Left 05/16/2023   Procedure: PERIPHERAL VASCULAR BALLOON ANGIOPLASTY;  Surgeon: Leonie Douglas, MD;  Location: [facility];  Service: Cardiovascular;  Laterality: Left;  left AT and left peroneal   Social History   Occupational History   Not on file  Tobacco Use   Smoking status: Never   Smokeless tobacco: Not on file  Vaping Use   Vaping status: Never Used  Substance and Sexual Activity   Alcohol use: Not Currently   Drug use: No   Sexual  activity: Not on file

## 2023-08-22 ENCOUNTER — Ambulatory Visit (INDEPENDENT_AMBULATORY_CARE_PROVIDER_SITE_OTHER): Payer: Self-pay | Admitting: Primary Care

## 2023-08-26 ENCOUNTER — Telehealth (INDEPENDENT_AMBULATORY_CARE_PROVIDER_SITE_OTHER): Payer: Self-pay | Admitting: Primary Care

## 2023-08-26 ENCOUNTER — Encounter (INDEPENDENT_AMBULATORY_CARE_PROVIDER_SITE_OTHER): Payer: Self-pay | Admitting: Primary Care

## 2023-08-26 ENCOUNTER — Other Ambulatory Visit (HOSPITAL_COMMUNITY): Payer: Self-pay

## 2023-08-26 DIAGNOSIS — Z7984 Long term (current) use of oral hypoglycemic drugs: Secondary | ICD-10-CM

## 2023-08-26 DIAGNOSIS — I10 Essential (primary) hypertension: Secondary | ICD-10-CM

## 2023-08-26 DIAGNOSIS — Z7985 Long-term (current) use of injectable non-insulin antidiabetic drugs: Secondary | ICD-10-CM

## 2023-08-26 DIAGNOSIS — E1142 Type 2 diabetes mellitus with diabetic polyneuropathy: Secondary | ICD-10-CM

## 2023-08-26 DIAGNOSIS — E1165 Type 2 diabetes mellitus with hyperglycemia: Secondary | ICD-10-CM

## 2023-08-26 MED ORDER — LISINOPRIL 20 MG PO TABS
20.0000 mg | ORAL_TABLET | Freq: Every evening | ORAL | 1 refills | Status: DC
  Filled 2023-08-26: qty 90, 90d supply, fill #0
  Filled 2023-09-07: qty 30, 30d supply, fill #0
  Filled 2023-10-10: qty 30, 30d supply, fill #1
  Filled 2023-12-18: qty 30, 30d supply, fill #2
  Filled 2024-01-14: qty 30, 30d supply, fill #3
  Filled 2024-02-18: qty 30, 30d supply, fill #4
  Filled 2024-03-11 – 2024-03-23 (×3): qty 30, 30d supply, fill #5

## 2023-08-26 MED ORDER — GABAPENTIN 100 MG PO CAPS
200.0000 mg | ORAL_CAPSULE | Freq: Three times a day (TID) | ORAL | 1 refills | Status: DC
  Filled 2023-08-26 – 2023-09-02 (×2): qty 180, 30d supply, fill #0
  Filled 2023-10-10: qty 180, 30d supply, fill #1

## 2023-08-26 MED ORDER — METFORMIN HCL ER 500 MG PO TB24
500.0000 mg | ORAL_TABLET | Freq: Two times a day (BID) | ORAL | 0 refills | Status: DC
  Filled 2023-08-26: qty 180, 90d supply, fill #0
  Filled 2023-09-07: qty 60, 30d supply, fill #0
  Filled 2023-10-10: qty 60, 30d supply, fill #1
  Filled 2023-11-14: qty 60, 30d supply, fill #2

## 2023-08-26 NOTE — Progress Notes (Addendum)
[facility]  Virtual Visit I connected with Patrick [name], on 08/26/2023 at 3:25 PM through an audio and video and verified that I am speaking with the correct person using two identifiers.   Consent: I discussed the limitations, risks, security and privacy concerns of performing an evaluation and management service by telephone and the availability of in person appointments. I also discussed with the patient that there may be a patient responsible charge related to this service. The patient expressed understanding and agreed to proceed.   Location of Patient: Home  Location of Provider: [facility]   Persons participating in Telemedicine visit: Ruthine Dose,  NP   History of Present Illness: Patrick [name] is a 77 year old Hispanic male his daughter is present during this visit to interpret.  Appointment was scheduled for hypertension and diabetes. Patient has No headache, No chest pain, No abdominal pain - No Nausea, No new weakness tingling or numbness, No Cough - shortness of breath. Denies polyuria, polydipsia, polyphasia or vision changes.  Does not check blood sugars at home.  He does complain of neuropathy.   Past Medical History:  Diagnosis Date   Diabetes mellitus without complication (HCC)    History of alcohol abuse    quit in 2015   Hypertension    Peripheral vascular disease (HCC)    No Known Allergies  Current Outpatient Medications on File Prior to Visit  Medication Sig Dispense Refill   acetaminophen (TYLENOL) 500 MG tablet Take 1,000 mg by mouth every 8 (eight) hours as needed (pain.).     ascorbic acid (VITAMIN C) 1000 MG tablet Take 1 tablet (1,000 mg total) by mouth daily. 100 tablet 0   aspirin EC 81 MG tablet Take 1 tablet (81 mg total) by mouth daily. Swallow whole. 120 tablet 2   atorvastatin (LIPITOR) 80 MG tablet Take 1 tablet (80 mg total) by mouth  daily. 90 tablet 0   clopidogrel (PLAVIX) 75 MG tablet Take 1 tablet (75 mg total) by mouth daily. 30 tablet 11   docusate sodium (COLACE) 100 MG capsule Take 1 capsule (100 mg total) by mouth daily. 100 capsule 0   Dulaglutide (TRULICITY) 0.75 MG/0.5ML SOPN Inject 0.75 mg into the skin once a week. 2 mL 4   empagliflozin (JARDIANCE) 25 MG TABS tablet Take 1 tablet (25 mg total) by mouth daily before breakfast. 90 tablet 1   ferrous sulfate 325 (65 FE) MG tablet Take 1 tablet (325 mg total) by mouth daily with breakfast. 100 tablet 0   gabapentin (NEURONTIN) 100 MG capsule Take 2 capsules (200 mg total) by mouth 3 (three) times daily. 180 capsule 0   Insulin Pen Needle (TECHLITE PEN NEEDLES) 32G X 4 MM MISC use up to four times daily as directed 100 each 6   lisinopril (ZESTRIL) 20 MG tablet Take 1 tablet (20 mg total) by mouth every evening. 30 tablet 0   melatonin 5 MG TABS Take 1 tablet (5 mg total) by mouth at bedtime as needed. 30 tablet 0   metFORMIN (GLUCOPHAGE-XR) 500 MG 24 hr tablet Take 1 tablet (500 mg total) by mouth 2 (two) times daily before a meal. 60 tablet 0   oxyCODONE (OXY IR/ROXICODONE) 5 MG immediate release tablet Take 1 tablet (5 mg total) by mouth every 6 (six) hours as needed for severe pain. 28 tablet 0   pantoprazole (PROTONIX) 40 MG tablet Take 1 tablet (40 mg total) by mouth daily.  30 tablet 0   polyethylene glycol powder (GLYCOLAX/MIRALAX) 17 GM/SCOOP powder Take 17 g by mouth 2 (two) times daily. 952 g 0   senna-docusate (SENOKOT-S) 8.6-50 MG tablet Take 2 tablets by mouth 2 (two) times daily. 120 tablet 0   sodium chloride (OCEAN) 0.65 % SOLN nasal spray Place 1 spray into both nostrils as needed for congestion.     sodium chloride 1 g tablet Take 1 tablet (1 g total) by mouth 3 (three) times daily with meals. 90 tablet 0   No current facility-administered medications on file prior to visit.    Observations/Objective: See HPI  Assessment and Plan: Diagnoses  and all orders for this visit:  Essential hypertension -     lisinopril (ZESTRIL) 20 MG tablet; Take 1 tablet (20 mg total) by mouth every evening.  Uncontrolled type 2 diabetes mellitus with hyperglycemia (HCC) Last A1c 8.9 prior to BKA with PAD Reviewing medications medications are not taking as prescribed or consistently Current medications are Trulicity 0.75 weekly and Jardiance 25 mg daily -     metFORMIN (GLUCOPHAGE-XR) 500 MG 24 hr tablet; Take 1 tablet (500 mg total) by mouth 2 (two) times daily before a meal.  Other orders 2/2 Diabetic polyneuropathy associated with type 2 diabetes mellitus (HCC)  -     gabapentin (NEURONTIN) 100 MG capsule; Take 2 capsules (200 mg total) by mouth 3 (three) times daily.     Follow Up Instructions: Patient will knee to come in for labs especially A1c as soon as daughter is available to bring him into the office.   I discussed the assessment and treatment plan with the patient. The patient was provided an opportunity to ask questions and all were answered. The patient agreed with the plan and demonstrated an understanding of the instructions.   The patient was advised to call back or seek an in-person evaluation if the symptoms worsen or if the condition fails to improve as anticipated.     I provided 25 minutes total of non-face-to-face time during this encounter including median intraservice time, reviewing previous notes, investigations, ordering medications, medical decision making, coordinating care and patient verbalized understanding at the end of the visit.    This note has been created with Education officer, environmental. Any transcriptional errors are unintentional.   Grayce Sessions, NP 08/26/2023, 3:25 PM

## 2023-09-02 ENCOUNTER — Encounter: Payer: Self-pay | Admitting: Physical Medicine & Rehabilitation

## 2023-09-02 ENCOUNTER — Other Ambulatory Visit (HOSPITAL_COMMUNITY): Payer: Self-pay

## 2023-09-05 ENCOUNTER — Other Ambulatory Visit (HOSPITAL_COMMUNITY): Payer: Self-pay

## 2023-09-08 ENCOUNTER — Other Ambulatory Visit (HOSPITAL_COMMUNITY): Payer: Self-pay

## 2023-09-08 ENCOUNTER — Encounter: Payer: Self-pay | Admitting: Physical Medicine & Rehabilitation

## 2023-09-11 ENCOUNTER — Other Ambulatory Visit (HOSPITAL_COMMUNITY): Payer: Self-pay

## 2023-09-15 ENCOUNTER — Ambulatory Visit (INDEPENDENT_AMBULATORY_CARE_PROVIDER_SITE_OTHER): Payer: Self-pay | Admitting: Orthopedic Surgery

## 2023-09-15 ENCOUNTER — Encounter: Payer: Self-pay | Admitting: Orthopedic Surgery

## 2023-09-15 DIAGNOSIS — Z89512 Acquired absence of left leg below knee: Secondary | ICD-10-CM

## 2023-09-15 DIAGNOSIS — I739 Peripheral vascular disease, unspecified: Secondary | ICD-10-CM

## 2023-09-15 DIAGNOSIS — S88112A Complete traumatic amputation at level between knee and ankle, left lower leg, initial encounter: Secondary | ICD-10-CM

## 2023-09-15 NOTE — Progress Notes (Signed)
Office Visit Note   Patient: Patrick [name]           Date of Birth: 09-23-1946           MRN: [id number] Visit Date: 09/15/2023              Requested by: Grayce Sessions, NP [address] [place],  [place] [address] PCP: Grayce Sessions, NP  Chief Complaint  Patient presents with   Left Leg - Routine Post Op    07/16/23 left BKA      HPI: Patient is a 77 year old gentleman who is 2 months status post left below-knee amputation.  Assessment & Plan: Visit Diagnoses:  1. PAD (peripheral artery disease) (HCC)   2. Below-knee amputation of left lower extremity, initial encounter (HCC)     Plan: The eschar was removed there is healthy flat granulation tissue.  Patient will continue with the size large stump shrinker.  Follow-Up Instructions: Return in about 2 weeks (around 09/29/2023).   Ortho Exam  Patient is alert, oriented, no adenopathy, well-dressed, normal affect, normal respiratory effort. Examination patient's residual limb continues to heal.  There is 1 remaining stable.  The multiple eschars were removed there is healthy granulation tissue at the base that is flat no tunneling no exposed bone or tendon.  These were touched with silver nitrate covered with 4 x 4's and the compression sock.  Imaging: No results found. No images are attached to the encounter.  Labs: Lab Results  Component Value Date   HGBA1C 6.8 (H) 07/04/2023   HGBA1C 8.9 (A) 03/06/2023   HGBA1C 7.5 (A) 12/05/2022   ESRSEDRATE 115 (H) 10/23/2021   CRP 15.1 (H) 10/23/2021   REPTSTATUS 10/29/2021 FINAL 10/24/2021   REPTSTATUS 10/29/2021 FINAL 10/24/2021   GRAMSTAIN  10/24/2021    MODERATE WBC PRESENT,BOTH PMN AND MONONUCLEAR FEW GRAM POSITIVE COCCI    GRAMSTAIN  10/24/2021    RARE WBC PRESENT, PREDOMINANTLY PMN RARE GRAM POSITIVE COCCI IN CLUSTERS    CULT  10/24/2021    FEW STAPHYLOCOCCUS AUREUS NO ANAEROBES ISOLATED Performed at [facility], [address]  [place], [place] [address]    CULT  10/24/2021    MODERATE STAPHYLOCOCCUS AUREUS NO ANAEROBES ISOLATED Performed at [facility], [address], [place], [place] [address]    [facility] STAPHYLOCOCCUS AUREUS 10/24/2021   LABORGA STAPHYLOCOCCUS AUREUS 10/24/2021     Lab Results  Component Value Date   ALBUMIN 2.9 (L) 07/24/2023   ALBUMIN 5.0 (H) 09/04/2022   ALBUMIN 2.5 (L) 10/25/2021    Lab Results  Component Value Date   MG 2.3 07/24/2023   MG 2.0 10/26/2021   MG 2.3 10/25/2021   Lab Results  Component Value Date   VD25OH 38.91 07/24/2023    No results found for: "PREALBUMIN"    Latest Ref Rng & Units 08/02/2023   10:34 AM 07/31/2023    7:18 AM 07/28/2023    6:51 AM  CBC EXTENDED  WBC 4.0 - 10.5 K/uL 5.3  9.0  6.3   RBC 4.22 - 5.81 MIL/uL 3.68  3.76  3.59   Hemoglobin 13.0 - 17.0 g/dL 9.9  9.9  9.8   HCT 21.3 - 52.0 % 31.3  31.2  30.6   Platelets 150 - 400 K/uL 262  264  275      There is no height or weight on file to calculate BMI.  Orders:  No orders of the defined types were placed in this encounter.  No orders of the defined types were placed in this encounter.    Procedures: No procedures performed  Clinical Data: No additional findings.  ROS:  All other systems negative, except as noted in the HPI. Review of Systems  Objective: Vital Signs: There were no vitals taken for this visit.  Specialty Comments:  No specialty comments available.  PMFS History: Patient Active Problem List   Diagnosis Date Noted   ABLA (acute blood loss anemia) 08/05/2023   Opioid-induced constipation 08/05/2023   Phantom pain 08/05/2023   Hyponatremia 08/05/2023   Below-knee amputation of left lower extremity (HCC) 07/23/2023   Gangrene of left foot (HCC) 07/16/2023   S/P BKA (below knee amputation) unilateral, left (HCC) 07/16/2023   Gangrene of toe of left foot (HCC) 07/04/2023   Amputated toe of left foot (HCC) 07/04/2023   Alcohol intoxication with  delirium (HCC) 09/04/2022   Prepatellar bursitis, left knee 11/13/2021   Infection of left knee (HCC) 10/24/2021   Cellulitis of knee 10/23/2021   Uncontrolled type 2 diabetes mellitus with hyperglycemia (HCC) 10/23/2021   Past Medical History:  Diagnosis Date   Diabetes mellitus without complication (HCC)    History of alcohol abuse    quit in 2015   Hypertension    Peripheral vascular disease (HCC)     Family History  Problem Relation Age of Onset   Diabetes Sister    Cancer Sister    Diabetes Brother     Past Surgical History:  Procedure Laterality Date   ABDOMINAL AORTOGRAM W/LOWER EXTREMITY Left 05/16/2023   Procedure: ABDOMINAL AORTOGRAM W/LOWER EXTREMITY;  Surgeon: Leonie Douglas, MD;  [facility];  Service: Cardiovascular;  Laterality: Left;   AMPUTATION Left 07/04/2023   Procedure: LEFT 4TH TOE AMPUTATION;  Surgeon: Nadara Mustard, MD;  Location: [facility] OR;  Service: Orthopedics;  Laterality: Left;   AMPUTATION Left 07/16/2023   Procedure: LEFT BELOW KNEE AMPUTATION;  Surgeon: Nadara Mustard, MD;  Location: [facility] OR;  Service: Orthopedics;  Laterality: Left;   APPLICATION OF WOUND VAC Left 07/16/2023   Procedure: APPLICATION OF WOUND VAC;  Surgeon: Nadara Mustard, MD;  Location: [facility] OR;  Service: Orthopedics;  Laterality: Left;   I & D EXTREMITY Left 10/24/2021   Procedure: IRRIGATION AND DEBRIDEMENT OF KNEE AND EXCISION PRE-PATELLA BURSA;  Surgeon: Ernestina Columbia, MD;  Location: [facility] OR;  Service: Orthopedics;  Laterality: Left;   PERIPHERAL VASCULAR BALLOON ANGIOPLASTY Left 05/16/2023   Procedure: PERIPHERAL VASCULAR BALLOON ANGIOPLASTY;  Surgeon: Leonie Douglas, MD;  [facility];  Service: Cardiovascular;  Laterality: Left;  left AT and left peroneal   Social History   Occupational History   Not on file  Tobacco Use   Smoking status: Never   Smokeless tobacco: Not on file  Vaping Use   Vaping status: Never Used  Substance and Sexual  Activity   Alcohol use: Not Currently   Drug use: No   Sexual activity: Not on file

## 2023-09-18 ENCOUNTER — Encounter: Payer: Self-pay | Attending: Physical Medicine & Rehabilitation | Admitting: Physical Medicine & Rehabilitation

## 2023-09-22 ENCOUNTER — Other Ambulatory Visit: Payer: Self-pay

## 2023-09-26 ENCOUNTER — Other Ambulatory Visit: Payer: Self-pay

## 2023-09-29 ENCOUNTER — Ambulatory Visit (INDEPENDENT_AMBULATORY_CARE_PROVIDER_SITE_OTHER): Payer: Self-pay | Admitting: Orthopedic Surgery

## 2023-09-29 DIAGNOSIS — Z89512 Acquired absence of left leg below knee: Secondary | ICD-10-CM

## 2023-09-29 DIAGNOSIS — I739 Peripheral vascular disease, unspecified: Secondary | ICD-10-CM

## 2023-09-29 DIAGNOSIS — S88112A Complete traumatic amputation at level between knee and ankle, left lower leg, initial encounter: Secondary | ICD-10-CM

## 2023-09-30 ENCOUNTER — Encounter: Payer: Self-pay | Admitting: Orthopedic Surgery

## 2023-09-30 NOTE — Progress Notes (Signed)
Office Visit Note   Patient: Patrick [name]           Date of Birth: August 27, 1946           MRN: [id number] Visit Date: 09/29/2023              Requested by: Grayce Sessions, NP [address] [place],  [place] [address] PCP: Grayce Sessions, NP  Chief Complaint  Patient presents with   Left Leg - Routine Post Op    07/16/23 left BKA      HPI: Patient is a 77 year old gentleman who is status post left below-knee amputation on September 18.  He is currently the size large shrinker.  Assessment & Plan: Visit Diagnoses:  1. PAD (peripheral artery disease) (HCC)   2. Below-knee amputation of left lower extremity, initial encounter (HCC)     Plan: Staples are removed the incision is healed nicely he will follow-up with [facility] for prosthetic fitting.  Follow-Up Instructions: Return in about 4 weeks (around 10/27/2023).   Ortho Exam  Patient is alert, oriented, no adenopathy, well-dressed, normal affect, normal respiratory effort. Examination the wound edges are well-approximated.  There is 1 staple remaining this is removed.  Eschar is removed and there is good approximation of the wound edges no cellulitis no signs of infection.  Imaging: No results found.   Labs: Lab Results  Component Value Date   HGBA1C 6.8 (H) 07/04/2023   HGBA1C 8.9 (A) 03/06/2023   HGBA1C 7.5 (A) 12/05/2022   ESRSEDRATE 115 (H) 10/23/2021   CRP 15.1 (H) 10/23/2021   REPTSTATUS 10/29/2021 FINAL 10/24/2021   REPTSTATUS 10/29/2021 FINAL 10/24/2021   GRAMSTAIN  10/24/2021    MODERATE WBC PRESENT,BOTH PMN AND MONONUCLEAR FEW GRAM POSITIVE COCCI    GRAMSTAIN  10/24/2021    RARE WBC PRESENT, PREDOMINANTLY PMN RARE GRAM POSITIVE COCCI IN CLUSTERS    CULT  10/24/2021    FEW STAPHYLOCOCCUS AUREUS NO ANAEROBES ISOLATED Performed at [facility], [address], [place], [place] [address]    CULT  10/24/2021    MODERATE STAPHYLOCOCCUS AUREUS NO ANAEROBES ISOLATED Performed at  [facility], [address], [place], [place] [address]    [facility] STAPHYLOCOCCUS AUREUS 10/24/2021   LABORGA STAPHYLOCOCCUS AUREUS 10/24/2021     Lab Results  Component Value Date   ALBUMIN 2.9 (L) 07/24/2023   ALBUMIN 5.0 (H) 09/04/2022   ALBUMIN 2.5 (L) 10/25/2021    Lab Results  Component Value Date   MG 2.3 07/24/2023   MG 2.0 10/26/2021   MG 2.3 10/25/2021   Lab Results  Component Value Date   VD25OH 38.91 07/24/2023    No results found for: "PREALBUMIN"    Latest Ref Rng & Units 08/02/2023   10:34 AM 07/31/2023    7:18 AM 07/28/2023    6:51 AM  CBC EXTENDED  WBC 4.0 - 10.5 K/uL 5.3  9.0  6.3   RBC 4.22 - 5.81 MIL/uL 3.68  3.76  3.59   Hemoglobin 13.0 - 17.0 g/dL 9.9  9.9  9.8   HCT 21.3 - 52.0 % 31.3  31.2  30.6   Platelets 150 - 400 K/uL 262  264  275      There is no height or weight on file to calculate BMI.  Orders:  No orders of the defined types were placed in this encounter.  No orders of the defined types were placed in this encounter.    Procedures: No procedures performed  Clinical Data: No additional findings.  ROS:  All other systems negative, except as noted in the HPI. Review of Systems  Objective: Vital Signs: There were no vitals taken for this visit.  Specialty Comments:  No specialty comments available.  PMFS History: Patient Active Problem List   Diagnosis Date Noted   ABLA (acute blood loss anemia) 08/05/2023   Opioid-induced constipation 08/05/2023   Phantom pain 08/05/2023   Hyponatremia 08/05/2023   Below-knee amputation of left lower extremity (HCC) 07/23/2023   Gangrene of left foot (HCC) 07/16/2023   S/P BKA (below knee amputation) unilateral, left (HCC) 07/16/2023   Gangrene of toe of left foot (HCC) 07/04/2023   Amputated toe of left foot (HCC) 07/04/2023   Alcohol intoxication with delirium (HCC) 09/04/2022   Prepatellar bursitis, left knee 11/13/2021   Infection of left knee (HCC) 10/24/2021    Cellulitis of knee 10/23/2021   Uncontrolled type 2 diabetes mellitus with hyperglycemia (HCC) 10/23/2021   Past Medical History:  Diagnosis Date   Diabetes mellitus without complication (HCC)    History of alcohol abuse    quit in 2015   Hypertension    Peripheral vascular disease (HCC)     Family History  Problem Relation Age of Onset   Diabetes Sister    Cancer Sister    Diabetes Brother     Past Surgical History:  Procedure Laterality Date   ABDOMINAL AORTOGRAM W/LOWER EXTREMITY Left 05/16/2023   Procedure: ABDOMINAL AORTOGRAM W/LOWER EXTREMITY;  Surgeon: Leonie Douglas, MD;  [facility];  Service: Cardiovascular;  Laterality: Left;   AMPUTATION Left 07/04/2023   Procedure: LEFT 4TH TOE AMPUTATION;  Surgeon: Nadara Mustard, MD;  Location: [facility] OR;  Service: Orthopedics;  Laterality: Left;   AMPUTATION Left 07/16/2023   Procedure: LEFT BELOW KNEE AMPUTATION;  Surgeon: Nadara Mustard, MD;  Location: [facility] OR;  Service: Orthopedics;  Laterality: Left;   APPLICATION OF WOUND VAC Left 07/16/2023   Procedure: APPLICATION OF WOUND VAC;  Surgeon: Nadara Mustard, MD;  Location: [facility] OR;  Service: Orthopedics;  Laterality: Left;   I & D EXTREMITY Left 10/24/2021   Procedure: IRRIGATION AND DEBRIDEMENT OF KNEE AND EXCISION PRE-PATELLA BURSA;  Surgeon: Ernestina Columbia, MD;  Location: [facility] OR;  Service: Orthopedics;  Laterality: Left;   PERIPHERAL VASCULAR BALLOON ANGIOPLASTY Left 05/16/2023   Procedure: PERIPHERAL VASCULAR BALLOON ANGIOPLASTY;  Surgeon: Leonie Douglas, MD;  [facility];  Service: Cardiovascular;  Laterality: Left;  left AT and left peroneal   Social History   Occupational History   Not on file  Tobacco Use   Smoking status: Never   Smokeless tobacco: Not on file  Vaping Use   Vaping status: Never Used  Substance and Sexual Activity   Alcohol use: Not Currently   Drug use: No   Sexual activity: Not on file

## 2023-10-10 ENCOUNTER — Other Ambulatory Visit: Payer: Self-pay

## 2023-10-17 ENCOUNTER — Other Ambulatory Visit: Payer: Self-pay

## 2023-10-27 ENCOUNTER — Encounter: Payer: Self-pay | Admitting: Orthopedic Surgery

## 2023-11-05 ENCOUNTER — Ambulatory Visit: Payer: Self-pay | Admitting: Family

## 2023-11-12 ENCOUNTER — Ambulatory Visit: Payer: Self-pay | Admitting: Family

## 2023-11-14 ENCOUNTER — Other Ambulatory Visit: Payer: Self-pay

## 2023-11-18 ENCOUNTER — Other Ambulatory Visit: Payer: Self-pay

## 2023-11-19 ENCOUNTER — Ambulatory Visit: Payer: Self-pay | Admitting: Family

## 2023-11-25 ENCOUNTER — Encounter: Payer: Self-pay | Admitting: Family

## 2023-11-25 ENCOUNTER — Ambulatory Visit (INDEPENDENT_AMBULATORY_CARE_PROVIDER_SITE_OTHER): Payer: Self-pay | Admitting: Family

## 2023-11-25 DIAGNOSIS — Z89512 Acquired absence of left leg below knee: Secondary | ICD-10-CM

## 2023-11-25 NOTE — Progress Notes (Signed)
Post-Op Visit Note   Patient: Patrick [name]           Date of Birth: February 21, 1946           MRN: [id number] Visit Date: 11/25/2023 PCP: Grayce Sessions, NP  Chief Complaint:  Chief Complaint  Patient presents with   Left Leg - Follow-up     07/16/23 left BKA      HPI:  HPI The patient is a 78 year old gentleman seen status post left below-knee amputation September 18.  He continues to wear his limb protector  Patient is a new left transtibial  amputee.  Patient's current comorbidities are not expected to impact the ability to function with the prescribed prosthesis. Patient verbally communicates a strong desire to use a prosthesis. Patient currently requires mobility aids to ambulate without a prosthesis.  Expects not to use mobility aids with a new prosthesis.  Patient is a K3 level ambulator that spends a lot of time walking around on uneven terrain over obstacles, up and down stairs, and ambulates with a variable cadence.     Ortho Exam On examination left residual limb this is well consolidated well-healed there is no open area no impending skin breakdown he is ready to proceed with prosthesis set up  Visit Diagnoses: No diagnosis found.  Plan: Given new order for prosthesis set up.  Follow-up in our clinic as needed.  Follow-Up Instructions: No follow-ups on file.   Imaging: No results found.  Orders:  No orders of the defined types were placed in this encounter.  No orders of the defined types were placed in this encounter.    PMFS History: Patient Active Problem List   Diagnosis Date Noted   ABLA (acute blood loss anemia) 08/05/2023   Opioid-induced constipation 08/05/2023   Phantom pain 08/05/2023   Hyponatremia 08/05/2023   Below-knee amputation of left lower extremity (HCC) 07/23/2023   Gangrene of left foot (HCC) 07/16/2023   S/P BKA (below knee amputation) unilateral, left (HCC) 07/16/2023   Gangrene of toe of left foot (HCC)  07/04/2023   Amputated toe of left foot (HCC) 07/04/2023   Alcohol intoxication with delirium (HCC) 09/04/2022   Prepatellar bursitis, left knee 11/13/2021   Infection of left knee (HCC) 10/24/2021   Cellulitis of knee 10/23/2021   Uncontrolled type 2 diabetes mellitus with hyperglycemia (HCC) 10/23/2021   Past Medical History:  Diagnosis Date   Diabetes mellitus without complication (HCC)    History of alcohol abuse    quit in 2015   Hypertension    Peripheral vascular disease (HCC)     Family History  Problem Relation Age of Onset   Diabetes Sister    Cancer Sister    Diabetes Brother     Past Surgical History:  Procedure Laterality Date   ABDOMINAL AORTOGRAM W/LOWER EXTREMITY Left 05/16/2023   Procedure: ABDOMINAL AORTOGRAM W/LOWER EXTREMITY;  Surgeon: Leonie Douglas, MD;  [facility];  Service: Cardiovascular;  Laterality: Left;   AMPUTATION Left 07/04/2023   Procedure: LEFT 4TH TOE AMPUTATION;  Surgeon: Nadara Mustard, MD;  Location: [facility] OR;  Service: Orthopedics;  Laterality: Left;   AMPUTATION Left 07/16/2023   Procedure: LEFT BELOW KNEE AMPUTATION;  Surgeon: Nadara Mustard, MD;  Location[facility] OR;  Service: Orthopedics;  Laterality: Left;   APPLICATION OF WOUND VAC Left 07/16/2023   Procedure: APPLICATION OF WOUND VAC;  Surgeon: Nadara Mustard, MD;  [facility] OR;  Service: Orthopedics;  Laterality: Left;  I & D EXTREMITY Left 10/24/2021   Procedure: IRRIGATION AND DEBRIDEMENT OF KNEE AND EXCISION PRE-PATELLA BURSA;  Surgeon: Ernestina Columbia, MD;  Location: [facility] OR;  Service: Orthopedics;  Laterality: Left;   PERIPHERAL VASCULAR BALLOON ANGIOPLASTY Left 05/16/2023   Procedure: PERIPHERAL VASCULAR BALLOON ANGIOPLASTY;  Surgeon: Leonie Douglas, MD;  [facility];  Service: Cardiovascular;  Laterality: Left;  left AT and left peroneal   Social History   Occupational History   Not on file  Tobacco Use   Smoking status: Never   Smokeless  tobacco: Not on file  Vaping Use   Vaping status: Never Used  Substance and Sexual Activity   Alcohol use: Not Currently   Drug use: No   Sexual activity: Not on file

## 2023-12-18 ENCOUNTER — Other Ambulatory Visit (INDEPENDENT_AMBULATORY_CARE_PROVIDER_SITE_OTHER): Payer: Self-pay | Admitting: Primary Care

## 2023-12-18 DIAGNOSIS — E1165 Type 2 diabetes mellitus with hyperglycemia: Secondary | ICD-10-CM

## 2023-12-18 MED ORDER — GABAPENTIN 100 MG PO CAPS
200.0000 mg | ORAL_CAPSULE | Freq: Three times a day (TID) | ORAL | 1 refills | Status: DC
  Filled 2023-12-18: qty 180, 30d supply, fill #0
  Filled 2024-02-05 – 2024-02-18 (×4): qty 180, 30d supply, fill #1

## 2023-12-18 MED ORDER — METFORMIN HCL ER 500 MG PO TB24
500.0000 mg | ORAL_TABLET | Freq: Two times a day (BID) | ORAL | 0 refills | Status: DC
  Filled 2023-12-18: qty 180, 90d supply, fill #0

## 2023-12-19 ENCOUNTER — Other Ambulatory Visit: Payer: Self-pay

## 2023-12-30 ENCOUNTER — Encounter (INDEPENDENT_AMBULATORY_CARE_PROVIDER_SITE_OTHER): Payer: Self-pay | Admitting: Primary Care

## 2023-12-30 ENCOUNTER — Ambulatory Visit (INDEPENDENT_AMBULATORY_CARE_PROVIDER_SITE_OTHER): Payer: Self-pay | Admitting: Primary Care

## 2023-12-30 VITALS — BP 148/78 | HR 75 | Resp 14

## 2023-12-30 DIAGNOSIS — S88112D Complete traumatic amputation at level between knee and ankle, left lower leg, subsequent encounter: Secondary | ICD-10-CM

## 2023-12-30 DIAGNOSIS — Z7984 Long term (current) use of oral hypoglycemic drugs: Secondary | ICD-10-CM

## 2023-12-30 DIAGNOSIS — I10 Essential (primary) hypertension: Secondary | ICD-10-CM

## 2023-12-30 DIAGNOSIS — E1165 Type 2 diabetes mellitus with hyperglycemia: Secondary | ICD-10-CM

## 2023-12-30 LAB — POCT GLYCOSYLATED HEMOGLOBIN (HGB A1C): HbA1c, POC (controlled diabetic range): 7.1 % — AB (ref 0.0–7.0)

## 2023-12-30 NOTE — Progress Notes (Signed)
 [facility]  Patrick [name], is a 78 y.o. male  [id number]  GMW:[id number]  DOB - Jun 05, 1946  Chief Complaint  Patient presents with   Follow-up       Subjective:   Patrick [name] is a 78 y.o. Hispanic male (patient has given daughter permission to translate for him and wife to be at his visit )here today for a follow up visit type 2 Denies polyuria, polydipsia, polyphasia or vision changes.  Does check blood sugars at home diabetes. ( 120-200) Also, hypertension patient has No headache, No chest pain, No abdominal pain - No Nausea, No new weakness tingling or numbness, No Cough - shortness of breath.  No problems updated.  Comprehensive ROS Pertinent positive and negative noted in HPI   No Known Allergies  Past Medical History:  Diagnosis Date   Diabetes mellitus without complication (HCC)    History of alcohol abuse    quit in 2015   Hypertension    Peripheral vascular disease (HCC)     Current Outpatient Medications on File Prior to Visit  Medication Sig Dispense Refill   empagliflozin (JARDIANCE) 25 MG TABS tablet Take 1 tablet (25 mg total) by mouth daily before breakfast. 90 tablet 1   gabapentin (NEURONTIN) 100 MG capsule Take 2 capsules (200 mg total) by mouth 3 (three) times daily. 180 capsule 1   Insulin Pen Needle (TECHLITE PEN NEEDLES) 32G X 4 MM MISC use up to four times daily as directed 100 each 6   lisinopril (ZESTRIL) 20 MG tablet Take 1 tablet (20 mg total) by mouth every evening. 90 tablet 1   metFORMIN (GLUCOPHAGE-XR) 500 MG 24 hr tablet Take 1 tablet (500 mg total) by mouth 2 (two) times daily before a meal. 180 tablet 0   sodium chloride (OCEAN) 0.65 % SOLN nasal spray Place 1 spray into both nostrils as needed for congestion.     acetaminophen (TYLENOL) 500 MG tablet Take 1,000 mg by mouth every 8 (eight) hours as needed (pain.). (Patient not taking: Reported on 12/30/2023)     ascorbic acid (VITAMIN C) 1000 MG tablet Take 1  tablet (1,000 mg total) by mouth daily. (Patient not taking: Reported on 12/30/2023) 100 tablet 0   aspirin EC 81 MG tablet Take 1 tablet (81 mg total) by mouth daily. Swallow whole. (Patient not taking: Reported on 12/30/2023) 120 tablet 2   atorvastatin (LIPITOR) 80 MG tablet Take 1 tablet (80 mg total) by mouth daily. (Patient not taking: Reported on 12/30/2023) 90 tablet 0   clopidogrel (PLAVIX) 75 MG tablet Take 1 tablet (75 mg total) by mouth daily. (Patient not taking: Reported on 12/30/2023) 30 tablet 11   docusate sodium (COLACE) 100 MG capsule Take 1 capsule (100 mg total) by mouth daily. (Patient not taking: Reported on 12/30/2023) 100 capsule 0   Dulaglutide (TRULICITY) 0.75 MG/0.5ML SOPN Inject 0.75 mg into the skin once a week. (Patient not taking: Reported on 12/30/2023) 2 mL 4   ferrous sulfate 325 (65 FE) MG tablet Take 1 tablet (325 mg total) by mouth daily with breakfast. (Patient not taking: Reported on 12/30/2023) 100 tablet 0   melatonin 5 MG TABS Take 1 tablet (5 mg total) by mouth at bedtime as needed. (Patient not taking: Reported on 12/30/2023) 30 tablet 0   oxyCODONE (OXY IR/ROXICODONE) 5 MG immediate release tablet Take 1 tablet (5 mg total) by mouth every 6 (six) hours as needed for severe pain. (Patient not taking: Reported on 12/30/2023) 28 tablet 0  pantoprazole (PROTONIX) 40 MG tablet Take 1 tablet (40 mg total) by mouth daily. (Patient not taking: Reported on 12/30/2023) 30 tablet 0   polyethylene glycol powder (GLYCOLAX/MIRALAX) 17 GM/SCOOP powder Take 17 g by mouth 2 (two) times daily. (Patient not taking: Reported on 12/30/2023) 952 g 0   senna-docusate (SENOKOT-S) 8.6-50 MG tablet Take 2 tablets by mouth 2 (two) times daily. (Patient not taking: Reported on 12/30/2023) 120 tablet 0   sodium chloride 1 g tablet Take 1 tablet (1 g total) by mouth 3 (three) times daily with meals. (Patient not taking: Reported on 12/30/2023) 90 tablet 0   No current facility-administered medications on file  prior to visit.   Health Maintenance  Topic Date Due   Eye exam for diabetics  Never done   Zoster (Shingles) Vaccine (1 of 2) Never done   Flu Shot  05/29/2023   COVID-19 Vaccine (1 - 2024-25 season) Never done   Yearly kidney health urinalysis for diabetes  09/05/2023   Complete foot exam   03/05/2024   Hemoglobin A1C  07/01/2024   Yearly kidney function blood test for diabetes  08/01/2024   DTaP/Tdap/Td vaccine (2 - Td or Tdap) 03/05/2033   Pneumonia Vaccine  Completed   Hepatitis C Screening  Completed   HPV Vaccine  Aged Out    Objective:   Vitals:   12/30/23 0930 12/30/23 1015  BP: (!) 168/84 (!) 148/78  Pulse: 75   Resp: 14   SpO2: 99%    BP Readings from Last 3 Encounters:  12/30/23 (!) 148/78  08/03/23 (!) 161/83  07/23/23 129/69      Physical Exam Vitals reviewed.  HENT:     Head: Normocephalic.     Nose: Nose normal.  Cardiovascular:     Rate and Rhythm: Normal rate and regular rhythm.  Pulmonary:     Effort: Pulmonary effort is normal.     Breath sounds: Normal breath sounds.  Abdominal:     General: Bowel sounds are normal.     Palpations: Abdomen is soft.  Musculoskeletal:     Cervical back: Normal range of motion.     Comments: Left leg BKA  Skin:    General: Skin is warm and dry.  Neurological:     Mental Status: He is oriented to person, place, and time.  Psychiatric:        Mood and Affect: Mood normal.        Behavior: Behavior normal.    Assessment & Plan   Payam was seen today for follow-up.  Diagnoses and all orders for this visit:  type 2 diabetes mellitus with hyperglycemia (HCC) -     POCT glycosylated hemoglobin (Hb A1C) 7.1 No change in medication problem is compliant in diet.   Below-knee amputation of left lower extremity, subsequent encounter (HCC) Excited next visit he will have his prosthetic leg   Essential hypertension BP goal - < 140/90 Explained that having normal blood pressure is the goal and medications are  helping to get to goal and maintain normal blood pressure. DIET: Limit salt intake, read nutrition labels to check salt content, limit fried and high fatty foods  Avoid using multisymptom OTC cold preparations that generally contain sudafed which can rise BP. Consult with pharmacist on best cold relief products to use for persons with HTN EXERCISE Discussed incorporating exercise such as walking - 30 minutes most days of the week and can do in 10 minute intervals  Patient have been counseled extensively about nutrition and exercise. Other issues discussed during this visit include: low cholesterol diet, weight control and daily exercise, foot care, annual eye examinations at Ophthalmology, importance of adherence with medications and regular follow-up. We also discussed long term complications of uncontrolled diabetes and hypertension.   Return in about 3 months (around 03/31/2024) for DM/HTN.  The patient was given clear instructions to go to ER or return to medical center if symptoms don't improve, worsen or new problems develop. The patient verbalized understanding. The patient was told to call to get lab results if they haven't heard anything in the next week.   This note has been created with Education officer, environmental. Any transcriptional errors are unintentional.   Grayce Sessions, NP 12/30/2023, 11:04 AM

## 2024-01-14 ENCOUNTER — Other Ambulatory Visit: Payer: Self-pay

## 2024-01-15 ENCOUNTER — Other Ambulatory Visit: Payer: Self-pay

## 2024-02-05 ENCOUNTER — Other Ambulatory Visit: Payer: Self-pay

## 2024-02-16 ENCOUNTER — Other Ambulatory Visit: Payer: Self-pay

## 2024-02-17 ENCOUNTER — Other Ambulatory Visit: Payer: Self-pay

## 2024-02-18 ENCOUNTER — Other Ambulatory Visit: Payer: Self-pay

## 2024-02-19 ENCOUNTER — Other Ambulatory Visit: Payer: Self-pay

## 2024-03-11 ENCOUNTER — Other Ambulatory Visit: Payer: Self-pay

## 2024-03-11 ENCOUNTER — Other Ambulatory Visit (INDEPENDENT_AMBULATORY_CARE_PROVIDER_SITE_OTHER): Payer: Self-pay | Admitting: Primary Care

## 2024-03-11 DIAGNOSIS — E1165 Type 2 diabetes mellitus with hyperglycemia: Secondary | ICD-10-CM

## 2024-03-12 ENCOUNTER — Other Ambulatory Visit: Payer: Self-pay

## 2024-03-12 MED ORDER — GABAPENTIN 100 MG PO CAPS
200.0000 mg | ORAL_CAPSULE | Freq: Three times a day (TID) | ORAL | 1 refills | Status: DC
  Filled 2024-03-12 – 2024-03-23 (×2): qty 180, 30d supply, fill #0
  Filled 2024-05-21: qty 180, 30d supply, fill #1

## 2024-03-12 MED ORDER — METFORMIN HCL ER 500 MG PO TB24
500.0000 mg | ORAL_TABLET | Freq: Two times a day (BID) | ORAL | 0 refills | Status: DC
Start: 2024-03-12 — End: 2024-03-31
  Filled 2024-03-12 – 2024-03-15 (×2): qty 180, 90d supply, fill #0

## 2024-03-15 ENCOUNTER — Other Ambulatory Visit: Payer: Self-pay

## 2024-03-23 ENCOUNTER — Other Ambulatory Visit (INDEPENDENT_AMBULATORY_CARE_PROVIDER_SITE_OTHER): Payer: Self-pay | Admitting: Primary Care

## 2024-03-23 ENCOUNTER — Other Ambulatory Visit: Payer: Self-pay

## 2024-03-24 ENCOUNTER — Other Ambulatory Visit: Payer: Self-pay

## 2024-03-24 NOTE — Telephone Encounter (Signed)
 Will forward to provider

## 2024-03-31 ENCOUNTER — Other Ambulatory Visit: Payer: Self-pay

## 2024-03-31 ENCOUNTER — Ambulatory Visit (INDEPENDENT_AMBULATORY_CARE_PROVIDER_SITE_OTHER): Payer: Self-pay | Admitting: Primary Care

## 2024-03-31 ENCOUNTER — Encounter (INDEPENDENT_AMBULATORY_CARE_PROVIDER_SITE_OTHER): Payer: Self-pay | Admitting: Primary Care

## 2024-03-31 VITALS — BP 156/90 | HR 80 | Resp 16 | Ht 61.0 in | Wt 138.8 lb

## 2024-03-31 DIAGNOSIS — R351 Nocturia: Secondary | ICD-10-CM

## 2024-03-31 DIAGNOSIS — I10 Essential (primary) hypertension: Secondary | ICD-10-CM

## 2024-03-31 DIAGNOSIS — Z89512 Acquired absence of left leg below knee: Secondary | ICD-10-CM

## 2024-03-31 DIAGNOSIS — Z7985 Long-term (current) use of injectable non-insulin antidiabetic drugs: Secondary | ICD-10-CM

## 2024-03-31 DIAGNOSIS — Z794 Long term (current) use of insulin: Secondary | ICD-10-CM

## 2024-03-31 DIAGNOSIS — I739 Peripheral vascular disease, unspecified: Secondary | ICD-10-CM

## 2024-03-31 DIAGNOSIS — E1165 Type 2 diabetes mellitus with hyperglycemia: Secondary | ICD-10-CM

## 2024-03-31 DIAGNOSIS — D649 Anemia, unspecified: Secondary | ICD-10-CM

## 2024-03-31 LAB — POCT GLYCOSYLATED HEMOGLOBIN (HGB A1C): HbA1c, POC (controlled diabetic range): 7.2 % — AB (ref 0.0–7.0)

## 2024-03-31 MED ORDER — EMPAGLIFLOZIN 25 MG PO TABS
25.0000 mg | ORAL_TABLET | Freq: Every day | ORAL | 1 refills | Status: AC
  Filled 2024-03-31: qty 90, 90d supply, fill #0

## 2024-03-31 MED ORDER — METFORMIN HCL ER 500 MG PO TB24
500.0000 mg | ORAL_TABLET | Freq: Two times a day (BID) | ORAL | 0 refills | Status: DC
  Filled 2024-03-31: qty 180, 90d supply, fill #0

## 2024-03-31 MED ORDER — LISINOPRIL 20 MG PO TABS
20.0000 mg | ORAL_TABLET | Freq: Every evening | ORAL | 1 refills | Status: DC
  Filled 2024-03-31: qty 90, 90d supply, fill #0
  Filled 2024-04-21: qty 30, 30d supply, fill #0
  Filled 2024-05-21: qty 30, 30d supply, fill #1
  Filled 2024-06-14 – 2024-06-17 (×2): qty 30, 30d supply, fill #2
  Filled 2024-06-17: qty 90, 90d supply, fill #2

## 2024-03-31 MED ORDER — METFORMIN HCL ER 500 MG PO TB24
500.0000 mg | ORAL_TABLET | Freq: Two times a day (BID) | ORAL | 1 refills | Status: AC
Start: 2024-03-31 — End: ?
  Filled 2024-03-31 – 2024-06-17 (×3): qty 180, 90d supply, fill #0
  Filled 2024-10-11: qty 180, 90d supply, fill #1

## 2024-03-31 MED ORDER — TRULICITY 0.75 MG/0.5ML ~~LOC~~ SOAJ
0.7500 mg | SUBCUTANEOUS | 4 refills | Status: DC
  Filled 2024-03-31: qty 2, 28d supply, fill #0

## 2024-03-31 NOTE — Progress Notes (Signed)
 [facility]  Patrick [name], is a 78 y.o. male  ZOX:[id number]  WJX:[id number]  DOB - [birth year]  Chief Complaint  Patient presents with   Diabetes   Hypertension   Hyperlipidemia       Subjective:   [name] is a 78 y.o. Hispanic male (interpreter Lynder Sanger [phone]) he presents today for diabetes management-Denies polyuria, polydipsia, polyphasia or vision changes.  Does not check blood sugars at home.  He voices concerns that his foot was hurting he has a below the knee amputation done on 07/16/2023.  Explained although the limb is not there that nerves are still present so he may feel his big toe hurting and knowing is not present.  Patient verbalized understanding also mention there was medication for neuropathy pain he is on gabapentin  .  Blood pressure is elevated 168/98 states he does not miss any medications.. Patient has No headache, No chest pain, No abdominal pain - No Nausea, No new weakness tingling or numbness, No Cough - shortness of breath  No problems updated.  Comprehensive ROS Pertinent positive and negative noted in HPI   No Known Allergies  Past Medical History:  Diagnosis Date   Diabetes mellitus without complication (HCC)    History of alcohol abuse    quit in 2015   Hypertension    Peripheral vascular disease (HCC)     Current Outpatient Medications on File Prior to Visit  Medication Sig Dispense Refill   acetaminophen  (TYLENOL ) 500 MG tablet Take 1,000 mg by mouth every 8 (eight) hours as needed (pain.). (Patient not taking: Reported on 12/30/2023)     ascorbic acid  (VITAMIN C ) 1000 MG tablet Take 1 tablet (1,000 mg total) by mouth daily. (Patient not taking: Reported on 12/30/2023) 100 tablet 0   aspirin  EC 81 MG tablet Take 1 tablet (81 mg total) by mouth daily. Swallow whole. (Patient not taking: Reported on 12/30/2023) 120 tablet 2   atorvastatin  (LIPITOR ) 80 MG tablet Take 1 tablet (80 mg total) by mouth daily. (Patient  not taking: Reported on 12/30/2023) 90 tablet 0   clopidogrel  (PLAVIX ) 75 MG tablet Take 1 tablet (75 mg total) by mouth daily. (Patient not taking: Reported on 12/30/2023) 30 tablet 11   docusate sodium  (COLACE) 100 MG capsule Take 1 capsule (100 mg total) by mouth daily. (Patient not taking: Reported on 12/30/2023) 100 capsule 0   Dulaglutide  (TRULICITY ) 0.75 MG/0.5ML SOPN Inject 0.75 mg into the skin once a week. (Patient not taking: Reported on 12/30/2023) 2 mL 4   empagliflozin  (JARDIANCE ) 25 MG TABS tablet Take 1 tablet (25 mg total) by mouth daily before breakfast. 90 tablet 1   ferrous sulfate  325 (65 FE) MG tablet Take 1 tablet (325 mg total) by mouth daily with breakfast. (Patient not taking: Reported on 12/30/2023) 100 tablet 0   gabapentin  (NEURONTIN ) 100 MG capsule Take 2 capsules (200 mg total) by mouth 3 (three) times daily. 180 capsule 1   Insulin  Pen Needle (TECHLITE PEN NEEDLES) 32G X 4 MM MISC use up to four times daily as directed 100 each 6   lisinopril  (ZESTRIL ) 20 MG tablet Take 1 tablet (20 mg total) by mouth every evening. 90 tablet 1   melatonin 5 MG TABS Take 1 tablet (5 mg total) by mouth at bedtime as needed. (Patient not taking: Reported on 12/30/2023) 30 tablet 0   metFORMIN  (GLUCOPHAGE -XR) 500 MG 24 hr tablet Take 1 tablet (500 mg total) by mouth 2 (two) times daily before a meal. 180  tablet 0   oxyCODONE  (OXY IR/ROXICODONE ) 5 MG immediate release tablet Take 1 tablet (5 mg total) by mouth every 6 (six) hours as needed for severe pain. (Patient not taking: Reported on 12/30/2023) 28 tablet 0   pantoprazole  (PROTONIX ) 40 MG tablet Take 1 tablet (40 mg total) by mouth daily. (Patient not taking: Reported on 12/30/2023) 30 tablet 0   polyethylene glycol powder (GLYCOLAX /MIRALAX ) 17 GM/SCOOP powder Take 17 g by mouth 2 (two) times daily. (Patient not taking: Reported on 12/30/2023) 952 g 0   senna-docusate (SENOKOT-S) 8.6-50 MG tablet Take 2 tablets by mouth 2 (two) times daily. (Patient not  taking: Reported on 12/30/2023) 120 tablet 0   sodium chloride  (OCEAN) 0.65 % SOLN nasal spray Place 1 spray into both nostrils as needed for congestion.     sodium chloride  1 g tablet Take 1 tablet (1 g total) by mouth 3 (three) times daily with meals. (Patient not taking: Reported on 12/30/2023) 90 tablet 0   No current facility-administered medications on file prior to visit.   Health Maintenance  Topic Date Due   Eye exam for diabetics  Never done   Zoster (Shingles) Vaccine (1 of 2) Never done   COVID-19 Vaccine (1 - 2024-25 season) Never done   Yearly kidney health urinalysis for diabetes  09/05/2023   Complete foot exam   03/05/2024   Flu Shot  05/28/2024   Hemoglobin A1C  07/01/2024   Yearly kidney function blood test for diabetes  08/01/2024   DTaP/Tdap/Td vaccine (2 - Td or Tdap) 03/05/2033   Pneumonia Vaccine  Completed   Hepatitis C Screening  Completed   HPV Vaccine  Aged Out   Meningitis B Vaccine  Aged Out    Objective:   Vitals:   03/31/24 0955 03/31/24 0956  BP: (!) 156/92 (!) 168/98  Pulse: 80   Resp: 16   SpO2: 97%   Weight: 138 lb 12.8 oz (63 kg)   Height: 5\' 1"  (1.549 m)    BP Readings from Last 3 Encounters:  03/31/24 (!) 168/98  12/30/23 (!) 148/78  08/03/23 (!) 161/83   Physical Exam Vitals reviewed.  HENT:     Head: Normocephalic.     Right Ear: Tympanic membrane and external ear normal.     Left Ear: Tympanic membrane and external ear normal.     Nose: Nose normal.  Eyes:     Extraocular Movements: Extraocular movements intact.     Pupils: Pupils are equal, round, and reactive to light.  Cardiovascular:     Rate and Rhythm: Normal rate and regular rhythm.  Pulmonary:     Effort: Pulmonary effort is normal.     Breath sounds: Normal breath sounds.  Abdominal:     General: Bowel sounds are normal. There is distension.     Palpations: Abdomen is soft.  Musculoskeletal:        General: Normal range of motion.     Cervical back: Normal range  of motion.  Skin:    General: Skin is warm and dry.  Neurological:     Mental Status: He is oriented to person, place, and time.  Psychiatric:        Mood and Affect: Mood normal.        Behavior: Behavior normal.        Thought Content: Thought content normal.        Judgment: Judgment normal.      Assessment & Plan  Kimberly was seen today for diabetes,  hypertension and hyperlipidemia.  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 -     Dulaglutide  (TRULICITY ) 0.75 MG/0.5ML SOAJ; Inject 0.75 mg into the skin once a week. -     empagliflozin  (JARDIANCE ) 25 MG TABS tablet; Take 1 tablet (25 mg total) by mouth daily before breakfast. -     metFORMIN  (GLUCOPHAGE -XR) 500 MG 24 hr tablet; Take 1 tablet (500 mg total) by mouth 2 (two) times daily before a meal.  Essential hypertension BP goal - < 240/90 ( out of Bp meds) Explained that having normal blood pressure is the goal and medications are helping to get to goal and maintain normal blood pressure. DIET: Limit salt intake, read nutrition labels to check salt content, limit fried and high fatty foods  Avoid using multisymptom OTC cold preparations that generally contain sudafed which can rise BP. Consult with pharmacist on best cold relief products to use for persons with HTN EXERCISE Discussed incorporating exercise such as walking - 30 minutes most days of the week and can do in 10 minute intervals    -     CMP14+EGFR -     lisinopril  (ZESTRIL ) 20 MG tablet; Take 1 tablet (20 mg total) by mouth  every evening.  Nocturia -     PSA  Anemia, unspecified type -     Iron, TIBC and Ferritin Panel     Patient have been counseled extensively about nutrition and exercise. Other issues discussed during this visit include: low cholesterol diet, weight control and daily exercise, foot care, annual eye examinations at Ophthalmology, importance of adherence with medications and regular follow-up. We also discussed long term complications of uncontrolled diabetes and hypertension.   Return in about 3 months (around 07/01/2024).  The patient was given clear instructions to go to ER or return to medical center if symptoms don't improve, worsen or new problems develop. The patient verbalized understanding. The patient was told to call to get lab results if they haven't heard anything in the next week.   This note has been created with Education officer, environmental. Any transcriptional errors are unintentional.   Marius Siemens, NP 03/31/2024, 10:08 AM

## 2024-04-01 ENCOUNTER — Other Ambulatory Visit: Payer: Self-pay

## 2024-04-02 ENCOUNTER — Ambulatory Visit (INDEPENDENT_AMBULATORY_CARE_PROVIDER_SITE_OTHER): Payer: Self-pay | Admitting: Primary Care

## 2024-04-02 ENCOUNTER — Other Ambulatory Visit: Payer: Self-pay

## 2024-04-02 LAB — CBC WITH DIFFERENTIAL/PLATELET
Basophils Absolute: 0.1 10*3/uL (ref 0.0–0.2)
Basos: 1 %
EOS (ABSOLUTE): 0.4 10*3/uL (ref 0.0–0.4)
Eos: 7 %
Hematocrit: 37.2 % — ABNORMAL LOW (ref 37.5–51.0)
Hemoglobin: 11.8 g/dL — ABNORMAL LOW (ref 13.0–17.7)
Immature Grans (Abs): 0 10*3/uL (ref 0.0–0.1)
Immature Granulocytes: 0 %
Lymphocytes Absolute: 2.3 10*3/uL (ref 0.7–3.1)
Lymphs: 43 %
MCH: 27.6 pg (ref 26.6–33.0)
MCHC: 31.7 g/dL (ref 31.5–35.7)
MCV: 87 fL (ref 79–97)
Monocytes Absolute: 0.4 10*3/uL (ref 0.1–0.9)
Monocytes: 7 %
Neutrophils Absolute: 2.2 10*3/uL (ref 1.4–7.0)
Neutrophils: 42 %
Platelets: 220 10*3/uL (ref 150–450)
RBC: 4.28 x10E6/uL (ref 4.14–5.80)
RDW: 12.5 % (ref 11.6–15.4)
WBC: 5.4 10*3/uL (ref 3.4–10.8)

## 2024-04-02 LAB — CMP14+EGFR
ALT: 10 IU/L (ref 0–44)
AST: 16 IU/L (ref 0–40)
Albumin: 4.7 g/dL (ref 3.8–4.8)
Alkaline Phosphatase: 96 IU/L (ref 44–121)
BUN/Creatinine Ratio: 18 (ref 10–24)
BUN: 14 mg/dL (ref 8–27)
Bilirubin Total: 0.3 mg/dL (ref 0.0–1.2)
CO2: 22 mmol/L (ref 20–29)
Calcium: 9.6 mg/dL (ref 8.6–10.2)
Chloride: 102 mmol/L (ref 96–106)
Creatinine, Ser: 0.76 mg/dL (ref 0.76–1.27)
Globulin, Total: 2.9 g/dL (ref 1.5–4.5)
Glucose: 112 mg/dL — ABNORMAL HIGH (ref 70–99)
Potassium: 4.6 mmol/L (ref 3.5–5.2)
Sodium: 140 mmol/L (ref 134–144)
Total Protein: 7.6 g/dL (ref 6.0–8.5)
eGFR: 93 mL/min/{1.73_m2} (ref >59)

## 2024-04-02 LAB — PSA: Prostate Specific Ag, Serum: 0.2 ng/mL (ref 0.0–4.0)

## 2024-04-02 LAB — MICROALBUMIN / CREATININE URINE RATIO
Creatinine, Urine: 126.9 mg/dL
Microalb/Creat Ratio: 8 mg/g{creat} (ref 0–29)
Microalbumin, Urine: 10.4 ug/mL

## 2024-04-02 LAB — LIPID PANEL
Chol/HDL Ratio: 6.3 ratio — ABNORMAL HIGH (ref 0.0–5.0)
Cholesterol, Total: 213 mg/dL — ABNORMAL HIGH (ref 100–199)
HDL: 34 mg/dL — ABNORMAL LOW (ref >39)
LDL Chol Calc (NIH): 143 mg/dL — ABNORMAL HIGH (ref 0–99)
Triglycerides: 200 mg/dL — ABNORMAL HIGH (ref 0–149)
VLDL Cholesterol Cal: 36 mg/dL (ref 5–40)

## 2024-04-02 LAB — IRON,TIBC AND FERRITIN PANEL
Ferritin: 14 ng/mL — ABNORMAL LOW (ref 30–400)
Iron Saturation: 7 % — CL (ref 15–55)
Iron: 26 ug/dL — ABNORMAL LOW (ref 38–169)
Total Iron Binding Capacity: 399 ug/dL (ref 250–450)
UIBC: 373 ug/dL — ABNORMAL HIGH (ref 111–343)

## 2024-04-02 MED ORDER — PRAVASTATIN SODIUM 80 MG PO TABS
80.0000 mg | ORAL_TABLET | Freq: Every day | ORAL | 3 refills | Status: AC
  Filled 2024-04-02: qty 30, 30d supply, fill #0
  Filled 2024-04-21 – 2024-05-05 (×2): qty 30, 30d supply, fill #1
  Filled 2024-06-14: qty 90, 90d supply, fill #2
  Filled 2024-10-11: qty 90, 90d supply, fill #3

## 2024-04-02 MED ORDER — PRAVASTATIN SODIUM 80 MG PO TABS
80.0000 mg | ORAL_TABLET | Freq: Every day | ORAL | 3 refills | Status: DC
  Filled 2024-04-02: qty 90, 90d supply, fill #0

## 2024-04-02 MED ORDER — FERROUS SULFATE 325 (65 FE) MG PO TABS: 325.0000 mg | ORAL_TABLET | Freq: Every day | ORAL | 0 refills | Status: DC

## 2024-04-02 MED ORDER — FERROUS SULFATE 325 (65 FE) MG PO TABS
325.0000 mg | ORAL_TABLET | Freq: Every day | ORAL | 0 refills | Status: DC
  Filled 2024-04-02: qty 100, 100d supply, fill #0

## 2024-04-07 ENCOUNTER — Other Ambulatory Visit: Payer: Self-pay

## 2024-04-19 ENCOUNTER — Other Ambulatory Visit: Payer: Self-pay

## 2024-04-21 ENCOUNTER — Other Ambulatory Visit: Payer: Self-pay

## 2024-04-26 ENCOUNTER — Telehealth: Payer: Self-pay

## 2024-04-26 ENCOUNTER — Other Ambulatory Visit: Payer: Self-pay

## 2024-04-26 NOTE — Telephone Encounter (Signed)
 [employer] application mailed to patient's home address today for completion. Time to re-enroll for [place]  assistance.

## 2024-05-05 ENCOUNTER — Other Ambulatory Visit: Payer: Self-pay

## 2024-05-05 ENCOUNTER — Other Ambulatory Visit (HOSPITAL_COMMUNITY): Payer: Self-pay

## 2024-05-10 ENCOUNTER — Other Ambulatory Visit: Payer: Self-pay

## 2024-05-21 ENCOUNTER — Other Ambulatory Visit: Payer: Self-pay

## 2024-05-25 ENCOUNTER — Other Ambulatory Visit: Payer: Self-pay

## 2024-06-14 ENCOUNTER — Other Ambulatory Visit: Payer: Self-pay

## 2024-06-17 ENCOUNTER — Other Ambulatory Visit: Payer: Self-pay

## 2024-06-18 ENCOUNTER — Other Ambulatory Visit: Payer: Self-pay

## 2024-06-29 ENCOUNTER — Telehealth (INDEPENDENT_AMBULATORY_CARE_PROVIDER_SITE_OTHER): Payer: Self-pay | Admitting: Primary Care

## 2024-06-29 NOTE — Telephone Encounter (Signed)
 Called pt to reschedule appt. Provider will not be in office today appt needs to be rescheduled. Please advise.

## 2024-06-30 ENCOUNTER — Ambulatory Visit (INDEPENDENT_AMBULATORY_CARE_PROVIDER_SITE_OTHER): Payer: Self-pay | Admitting: Primary Care

## 2024-07-08 ENCOUNTER — Other Ambulatory Visit (INDEPENDENT_AMBULATORY_CARE_PROVIDER_SITE_OTHER): Payer: Self-pay | Admitting: Primary Care

## 2024-07-09 ENCOUNTER — Other Ambulatory Visit: Payer: Self-pay

## 2024-07-09 MED ORDER — GABAPENTIN 100 MG PO CAPS
200.0000 mg | ORAL_CAPSULE | Freq: Three times a day (TID) | ORAL | 1 refills | Status: DC
  Filled 2024-07-09: qty 180, 30d supply, fill #0
  Filled 2024-09-02: qty 180, 30d supply, fill #1

## 2024-07-09 NOTE — Telephone Encounter (Signed)
 Requested Prescriptions  Pending Prescriptions Disp Refills   gabapentin  (NEURONTIN ) 100 MG capsule 180 capsule 1    Sig: Take 2 capsules (200 mg total) by mouth 3 (three) times daily.     Neurology: Anticonvulsants - gabapentin  Passed - 07/09/2024 11:01 AM      Passed - Cr in normal range and within 360 days    Creatinine, Ser  Date Value Ref Range Status  03/31/2024 0.76 0.76 - 1.27 mg/dL Final         Passed - Completed PHQ-2 or PHQ-9 in the last 360 days      Passed - Valid encounter within last 12 months    Recent Outpatient Visits           3 months ago S/P BKA (below knee amputation) unilateral, left (HCC)   [place] [facility] Celestia Rosaline SQUIBB, NP   6 months ago type 2 diabetes mellitus with hyperglycemia (HCC)   [place] [facility] Celestia Rosaline SQUIBB, NP   10 months ago Diabetic polyneuropathy associated with type 2 diabetes mellitus [facility])   [place] [facility] Celestia Rosaline SQUIBB, NP   1 year ago Onychocryptosis   [place] [facility] Celestia Rosaline SQUIBB, NP   1 year ago Uncontrolled type 2 diabetes mellitus with hyperglycemia [facility])   [place] [facility] Celestia Rosaline SQUIBB, NP

## 2024-07-12 ENCOUNTER — Other Ambulatory Visit: Payer: Self-pay

## 2024-08-17 ENCOUNTER — Other Ambulatory Visit: Payer: Self-pay

## 2024-08-17 ENCOUNTER — Encounter (HOSPITAL_COMMUNITY): Payer: Self-pay | Admitting: Internal Medicine

## 2024-08-17 ENCOUNTER — Observation Stay (HOSPITAL_COMMUNITY): Payer: Self-pay

## 2024-08-17 ENCOUNTER — Emergency Department (HOSPITAL_COMMUNITY): Payer: Self-pay

## 2024-08-17 ENCOUNTER — Inpatient Hospital Stay (HOSPITAL_COMMUNITY)
Admission: EM | Admit: 2024-08-17 | Discharge: 2024-08-19 | DRG: 371 | Disposition: A | Discharge status: Home / Self Care | Payer: MEDICAID | Attending: Family Medicine | Admitting: Family Medicine

## 2024-08-17 DIAGNOSIS — F1011 Alcohol abuse, in remission: Secondary | ICD-10-CM | POA: Diagnosis present

## 2024-08-17 DIAGNOSIS — N179 Acute kidney failure, unspecified: Secondary | ICD-10-CM | POA: Diagnosis present

## 2024-08-17 DIAGNOSIS — E871 Hypo-osmolality and hyponatremia: Secondary | ICD-10-CM | POA: Diagnosis present

## 2024-08-17 DIAGNOSIS — E876 Hypokalemia: Secondary | ICD-10-CM | POA: Diagnosis present

## 2024-08-17 DIAGNOSIS — A044 Other intestinal Escherichia coli infections: Secondary | ICD-10-CM | POA: Diagnosis present

## 2024-08-17 DIAGNOSIS — Z7982 Long term (current) use of aspirin: Secondary | ICD-10-CM

## 2024-08-17 DIAGNOSIS — Z89512 Acquired absence of left leg below knee: Secondary | ICD-10-CM

## 2024-08-17 DIAGNOSIS — Z833 Family history of diabetes mellitus: Secondary | ICD-10-CM

## 2024-08-17 DIAGNOSIS — E860 Dehydration: Secondary | ICD-10-CM | POA: Diagnosis present

## 2024-08-17 DIAGNOSIS — Z7984 Long term (current) use of oral hypoglycemic drugs: Secondary | ICD-10-CM

## 2024-08-17 DIAGNOSIS — G9341 Metabolic encephalopathy: Secondary | ICD-10-CM | POA: Diagnosis present

## 2024-08-17 DIAGNOSIS — Z79899 Other long term (current) drug therapy: Secondary | ICD-10-CM

## 2024-08-17 DIAGNOSIS — Z794 Long term (current) use of insulin: Secondary | ICD-10-CM

## 2024-08-17 DIAGNOSIS — A020 Salmonella enteritis: Principal | ICD-10-CM | POA: Diagnosis present

## 2024-08-17 DIAGNOSIS — R441 Visual hallucinations: Secondary | ICD-10-CM | POA: Diagnosis present

## 2024-08-17 DIAGNOSIS — R197 Diarrhea, unspecified: Principal | ICD-10-CM

## 2024-08-17 DIAGNOSIS — I10 Essential (primary) hypertension: Secondary | ICD-10-CM | POA: Diagnosis present

## 2024-08-17 DIAGNOSIS — E1151 Type 2 diabetes mellitus with diabetic peripheral angiopathy without gangrene: Secondary | ICD-10-CM | POA: Diagnosis present

## 2024-08-17 DIAGNOSIS — E785 Hyperlipidemia, unspecified: Secondary | ICD-10-CM | POA: Diagnosis present

## 2024-08-17 DIAGNOSIS — E872 Acidosis, unspecified: Secondary | ICD-10-CM | POA: Diagnosis present

## 2024-08-17 LAB — IRON AND TIBC
Iron: 42 ug/dL — ABNORMAL LOW (ref 45–182)
Saturation Ratios: 11 % — ABNORMAL LOW (ref 17.9–39.5)
TIBC: 368 ug/dL (ref 250–450)
UIBC: 326 ug/dL

## 2024-08-17 LAB — COMPREHENSIVE METABOLIC PANEL WITH GFR
ALT: 15 U/L (ref 0–44)
AST: 21 U/L (ref 15–41)
Albumin: 3.5 g/dL (ref 3.5–5.0)
Alkaline Phosphatase: 67 U/L (ref 38–126)
Anion gap: 19 — ABNORMAL HIGH (ref 5–15)
BUN: 118 mg/dL — ABNORMAL HIGH (ref 8–23)
CO2: 13 mmol/L — ABNORMAL LOW (ref 22–32)
Calcium: 8.4 mg/dL — ABNORMAL LOW (ref 8.9–10.3)
Chloride: 96 mmol/L — ABNORMAL LOW (ref 98–111)
Creatinine, Ser: 7.06 mg/dL — ABNORMAL HIGH (ref 0.61–1.24)
GFR, Estimated: 7 mL/min — ABNORMAL LOW (ref >60)
Glucose, Bld: 140 mg/dL — ABNORMAL HIGH (ref 70–99)
Potassium: 3.1 mmol/L — ABNORMAL LOW (ref 3.5–5.1)
Sodium: 128 mmol/L — ABNORMAL LOW (ref 135–145)
Total Bilirubin: 0.7 mg/dL (ref 0.0–1.2)
Total Protein: 7.2 g/dL (ref 6.5–8.1)

## 2024-08-17 LAB — I-STAT CG4 LACTIC ACID, ED: Lactic Acid, Venous: 2 mmol/L (ref 0.5–1.9)

## 2024-08-17 LAB — HEMOGLOBIN AND HEMATOCRIT, BLOOD
HCT: 29.7 % — ABNORMAL LOW (ref 39.0–52.0)
HCT: 29.1 % — ABNORMAL LOW (ref 39.0–52.0)
HCT: 26.1 % — ABNORMAL LOW (ref 39.0–52.0)
Hemoglobin: 9.3 g/dL — ABNORMAL LOW (ref 13.0–17.0)
Hemoglobin: 9.5 g/dL — ABNORMAL LOW (ref 13.0–17.0)
Hemoglobin: 8.7 g/dL — ABNORMAL LOW (ref 13.0–17.0)

## 2024-08-17 LAB — CBC
HCT: 31.9 % — ABNORMAL LOW (ref 39.0–52.0)
Hemoglobin: 10.1 g/dL — ABNORMAL LOW (ref 13.0–17.0)
MCH: 24.4 pg — ABNORMAL LOW (ref 26.0–34.0)
MCHC: 31.7 g/dL (ref 30.0–36.0)
MCV: 77.1 fL — ABNORMAL LOW (ref 80.0–100.0)
Platelets: 238 K/uL (ref 150–400)
RBC: 4.14 MIL/uL — ABNORMAL LOW (ref 4.22–5.81)
RDW: 15.2 % (ref 11.5–15.5)
WBC: 6.1 K/uL (ref 4.0–10.5)
nRBC: 0 % (ref 0.0–0.2)

## 2024-08-17 LAB — RETICULOCYTES
Immature Retic Fract: 13.3 % (ref 2.3–15.9)
RBC.: 3.83 MIL/uL — ABNORMAL LOW (ref 4.22–5.81)
Retic Count, Absolute: 33.7 K/uL (ref 19.0–186.0)
Retic Ct Pct: 0.9 % (ref 0.4–3.1)

## 2024-08-17 LAB — HEMOGLOBIN A1C
Hgb A1c MFr Bld: 7.7 % — ABNORMAL HIGH (ref 4.8–5.6)
Mean Plasma Glucose: 174.29 mg/dL

## 2024-08-17 LAB — MAGNESIUM
Magnesium: 2.5 mg/dL — ABNORMAL HIGH (ref 1.7–2.4)
Magnesium: 2.7 mg/dL — ABNORMAL HIGH (ref 1.7–2.4)

## 2024-08-17 LAB — LIPASE, BLOOD: Lipase: 34 U/L (ref 11–51)

## 2024-08-17 LAB — TYPE AND SCREEN
ABO/RH(D): O POS
Antibody Screen: NEGATIVE

## 2024-08-17 LAB — VITAMIN B12: Vitamin B-12: 220 pg/mL (ref 180–914)

## 2024-08-17 LAB — OSMOLALITY: Osmolality: 318 mosm/kg — ABNORMAL HIGH (ref 275–295)

## 2024-08-17 LAB — GAMMA GT
GGT: 17 U/L (ref 7–50)
GGT: 17 U/L (ref 7–50)

## 2024-08-17 LAB — FERRITIN: Ferritin: 28 ng/mL (ref 24–336)

## 2024-08-17 LAB — FOLATE: Folate: >20 ng/mL (ref >5.9)

## 2024-08-17 LAB — ETHANOL: Alcohol, Ethyl (B): <15 mg/dL (ref <15)

## 2024-08-17 LAB — ABO/RH: ABO/RH(D): O POS

## 2024-08-17 MED ORDER — PANTOPRAZOLE SODIUM 40 MG IV SOLR
40.0000 mg | Freq: Two times a day (BID) | INTRAVENOUS | Status: DC
  Administered 2024-08-17 – 2024-08-19 (×4): 40 mg via INTRAVENOUS
  Filled 2024-08-17 (×4): qty 10

## 2024-08-17 MED ORDER — SODIUM CHLORIDE 0.9 % IV SOLN: INTRAVENOUS | Status: DC

## 2024-08-17 MED ORDER — ACETAMINOPHEN 325 MG PO TABS: 650.0000 mg | ORAL_TABLET | Freq: Four times a day (QID) | ORAL | Status: DC | PRN

## 2024-08-17 MED ORDER — THIAMINE HCL 100 MG/ML IJ SOLN
500.0000 mg | Freq: Once | INTRAVENOUS | Status: AC
  Administered 2024-08-17: 500 mg via INTRAVENOUS
  Filled 2024-08-17 (×2): qty 5

## 2024-08-17 MED ORDER — ACETAMINOPHEN 650 MG RE SUPP: 650.0000 mg | Freq: Four times a day (QID) | RECTAL | Status: DC | PRN

## 2024-08-17 MED ORDER — LACTATED RINGERS IV BOLUS
2000.0000 mL | Freq: Once | INTRAVENOUS | Status: AC
  Administered 2024-08-17: 2000 mL via INTRAVENOUS

## 2024-08-17 MED ORDER — ALBUTEROL SULFATE (2.5 MG/3ML) 0.083% IN NEBU: 2.5000 mg | INHALATION_SOLUTION | RESPIRATORY_TRACT | Status: DC | PRN

## 2024-08-17 MED ORDER — STERILE WATER FOR INJECTION IV SOLN
INTRAVENOUS | Status: DC
  Filled 2024-08-17 (×2): qty 1000

## 2024-08-17 MED ORDER — SODIUM CHLORIDE 0.9% FLUSH
3.0000 mL | Freq: Two times a day (BID) | INTRAVENOUS | Status: DC
  Administered 2024-08-17 – 2024-08-19 (×5): 3 mL via INTRAVENOUS

## 2024-08-17 NOTE — ED Provider Notes (Signed)
 [place] EMERGENCY DEPARTMENT AT [facility] Provider Note   CSN: [id number] Arrival date & time: 08/17/24  9267     Patient presents with: Diarrhea   Patrick [name] is a 78 y.o. male.   78 year old male presents today for concern of diarrhea since Friday.  Reports multiple episodes per day.  Denies any nausea, vomiting.  Endorses decreased p.o. intake during this time and since yesterday also decreased urine output.  Has history of diabetes and has had left lower extremity amputation from this. Patient declined Education officer, environmental.  Family served as Equities trader.   The history is provided by the patient. No language interpreter was used.       Prior to Admission medications   Medication Sig Start Date End Date Taking? Authorizing Provider  acetaminophen  (TYLENOL ) 500 MG tablet Take 1,000 mg by mouth every 8 (eight) hours as needed (pain.). Patient not taking: Reported on 12/30/2023    [provider]  ascorbic acid  (VITAMIN C ) 1000 MG tablet Take 1 tablet (1,000 mg total) by mouth daily. Patient not taking: Reported on 12/30/2023 08/01/23   Love, Sharlet GORMAN RIGGERS  aspirin  EC 81 MG tablet Take 1 tablet (81 mg total) by mouth daily. Swallow whole. Patient not taking: Reported on 12/30/2023 05/16/23   Magda Debby SAILOR, MD  docusate sodium  (COLACE) 100 MG capsule Take 1 capsule (100 mg total) by mouth daily. Patient not taking: Reported on 12/30/2023 08/01/23   Love, Sharlet GORMAN, PA-C  Dulaglutide  (TRULICITY ) 0.75 MG/0.5ML SOAJ Inject 0.75 mg into the skin once a week. 03/31/24   Celestia Rosaline SQUIBB, NP  empagliflozin  (JARDIANCE ) 25 MG TABS tablet Take 1 tablet (25 mg total) by mouth daily before breakfast. 03/31/24   Celestia Rosaline SQUIBB, NP  ferrous sulfate  325 (65 FE) MG tablet Take 1 tablet (325 mg total) by mouth daily with breakfast. 04/02/24   Celestia Rosaline SQUIBB, NP  gabapentin  (NEURONTIN ) 100 MG capsule Take 2 capsules (200 mg total) by mouth 3 (three) times daily. 07/09/24    Celestia Rosaline SQUIBB, NP  Insulin  Pen Needle (TECHLITE PEN NEEDLES) 32G X 4 MM MISC use up to four times daily as directed 03/06/23   Celestia Rosaline SQUIBB, NP  lisinopril  (ZESTRIL ) 20 MG tablet Take 1 tablet (20 mg total) by mouth every evening. 03/31/24   Celestia Rosaline SQUIBB, NP  melatonin 5 MG TABS Take 1 tablet (5 mg total) by mouth at bedtime as needed. Patient not taking: Reported on 12/30/2023 07/31/23   Love, Sharlet GORMAN, PA-C  metFORMIN  (GLUCOPHAGE -XR) 500 MG 24 hr tablet Take 1 tablet (500 mg total) by mouth 2 (two) times daily before a meal. 03/31/24   Celestia Rosaline SQUIBB, NP  oxyCODONE  (OXY IR/ROXICODONE ) 5 MG immediate release tablet Take 1 tablet (5 mg total) by mouth every 6 (six) hours as needed for severe pain. Patient not taking: Reported on 12/30/2023 07/31/23   Love, Sharlet GORMAN, PA-C  pantoprazole  (PROTONIX ) 40 MG tablet Take 1 tablet (40 mg total) by mouth daily. Patient not taking: Reported on 12/30/2023 08/01/23   Love, Sharlet GORMAN, PA-C  polyethylene glycol powder (GLYCOLAX /MIRALAX ) 17 GM/SCOOP powder Take 17 g by mouth 2 (two) times daily. Patient not taking: Reported on 12/30/2023 07/31/23   Love, Sharlet GORMAN, PA-C  pravastatin  (PRAVACHOL ) 80 MG tablet Take 1 tablet (80 mg total) by mouth daily. 04/02/24   Celestia Rosaline SQUIBB, NP  senna-docusate (SENOKOT-S) 8.6-50 MG tablet Take 2 tablets by mouth 2 (two) times daily. Patient not taking: Reported  on 12/30/2023 07/31/23   Maurice Sharlet RAMAN, PA-C  sodium chloride  (OCEAN) 0.65 % SOLN nasal spray Place 1 spray into both nostrils as needed for congestion.    [provider]  sodium chloride  1 g tablet Take 1 tablet (1 g total) by mouth 3 (three) times daily with meals. Patient not taking: Reported on 12/30/2023 07/31/23   Maurice Sharlet RAMAN, PA-C    Allergies: Patient has no known allergies.    Review of Systems  Constitutional:  Negative for chills and fever.  Respiratory:  Negative for shortness of breath.   Cardiovascular:  Negative for chest pain.   Gastrointestinal:  Positive for diarrhea. Negative for abdominal pain, nausea and vomiting.  Neurological:  Negative for light-headedness.  All other systems reviewed and are negative.   Updated Vital Signs BP 108/85   Pulse 74   Temp 97.6 F (36.4 C) (Oral)   Resp 18   SpO2 98%   Physical Exam Vitals and nursing note reviewed.  Constitutional:      General: He is not in acute distress.    Appearance: Normal appearance. He is not ill-appearing.  HENT:     Head: Normocephalic and atraumatic.     Nose: Nose normal.  Eyes:     Conjunctiva/sclera: Conjunctivae normal.  Cardiovascular:     Rate and Rhythm: Normal rate and regular rhythm.  Pulmonary:     Effort: Pulmonary effort is normal. No respiratory distress.  Abdominal:     General: There is no distension.     Palpations: Abdomen is soft.     Tenderness: There is no abdominal tenderness. There is no guarding.  Musculoskeletal:        General: No deformity. Normal range of motion.     Cervical back: Normal range of motion.  Skin:    Findings: No rash.  Neurological:     Mental Status: He is alert.     (all labs ordered are listed, but only abnormal results are displayed) Labs Reviewed  COMPREHENSIVE METABOLIC PANEL WITH GFR - Abnormal; Notable for the following components:      Result Value   Sodium 128 (*)    Potassium 3.1 (*)    Chloride 96 (*)    CO2 13 (*)    Glucose, Bld 140 (*)    BUN 118 (*)    Creatinine, Ser 7.06 (*)    Calcium  8.4 (*)    GFR, Estimated 7 (*)    Anion gap 19 (*)    All other components within normal limits  CBC - Abnormal; Notable for the following components:   RBC 4.14 (*)    Hemoglobin 10.1 (*)    HCT 31.9 (*)    MCV 77.1 (*)    MCH 24.4 (*)    All other components within normal limits  GASTROINTESTINAL PANEL BY PCR, STOOL (REPLACES STOOL CULTURE)  C DIFFICILE QUICK SCREEN W PCR REFLEX    LIPASE, BLOOD  URINALYSIS, ROUTINE W REFLEX MICROSCOPIC  OSMOLALITY  OSMOLALITY,  URINE  SODIUM, URINE, RANDOM  CREATININE, URINE, RANDOM  ETHANOL  MAGNESIUM   GAMMA GT  VITAMIN B12  FOLATE  IRON AND TIBC  FERRITIN  RETICULOCYTES  HEMOGLOBIN AND HEMATOCRIT, BLOOD  HEMOGLOBIN AND HEMATOCRIT, BLOOD  HEMOGLOBIN AND HEMATOCRIT, BLOOD  HEMOGLOBIN AND HEMATOCRIT, BLOOD  HEMOGLOBIN A1C  I-STAT CG4 LACTIC ACID, ED  TYPE AND SCREEN  ABO/RH    EKG: None  Radiology: No results found.   Procedures   Medications Ordered in the ED  pantoprazole  (PROTONIX ) injection 40 mg (has no administration in time range)  sodium chloride  flush (NS) 0.9 % injection 3 mL (has no administration in time range)  0.9 %  sodium chloride  infusion (has no administration in time range)  acetaminophen  (TYLENOL ) tablet 650 mg (has no administration in time range)    Or  acetaminophen  (TYLENOL ) suppository 650 mg (has no administration in time range)  albuterol (PROVENTIL) (2.5 MG/3ML) 0.083% nebulizer solution 2.5 mg (has no administration in time range)  lactated ringers  bolus 2,000 mL (2,000 mLs Intravenous New Bag/Given 08/17/24 1155)    Clinical Course as of 08/17/24 1326  Tue Aug 17, 2024  1145 Patient evaluated.  He is without acute distress.  Hemodynamically stable.  Labs show CMP with creatinine of 7.06, BUN of 118.  He has had 5 days of diarrhea.  Reports several episodes a day.  Decreased urine output. He does have history of diabetes and has had to undergo left lower extremity amputation secondary to diabetic complication.  Hyponatremia at 128.  Hypokalemia at 3.1.  Hemoglobin of 10.1 but around his baseline.  No leukocytosis. Will consult nephrology.  Will provide fluids.  Patient will require admission. [AA]    Clinical Course User Index [AA] Hildegard Loge, PA-C                                 Medical Decision Making Amount and/or Complexity of Data Reviewed Labs: ordered.  Risk Decision regarding hospitalization.   Medical Decision Making / ED Course   This  patient presents to the ED for concern of diarrhea, this involves an extensive number of treatment options, and is a complaint that carries with it a high risk of complications and morbidity.  The differential diagnosis includes gastroenteritis, C. difficile, dehydration  MDM: 78 year old male presents today for concern of diarrhea since Friday. Exam reassuring. Hemodynamically stable.  CBC without leukocytosis.  Hemoglobin around baseline.  CMP with sodium of 128, potassium 3.1, creatinine at 7.06 above from his baseline of 0.7. Will give IV fluids. Likely due to dehydration.  Notified nephrology and they will consult. Discussed with hospitalist and they will admit.     Additional history obtained: -Additional history obtained from family -External records from outside source obtained and reviewed including: Chart review including previous notes, labs, imaging, consultation notes   Lab Tests: -I ordered, reviewed, and interpreted labs.   The pertinent results include:   Labs Reviewed  COMPREHENSIVE METABOLIC PANEL WITH GFR - Abnormal; Notable for the following components:      Result Value   Sodium 128 (*)    Potassium 3.1 (*)    Chloride 96 (*)    CO2 13 (*)    Glucose, Bld 140 (*)    BUN 118 (*)    Creatinine, Ser 7.06 (*)    Calcium  8.4 (*)    GFR, Estimated 7 (*)    Anion gap 19 (*)    All other components within normal limits  CBC - Abnormal; Notable for the following components:   RBC 4.14 (*)    Hemoglobin 10.1 (*)    HCT 31.9 (*)    MCV 77.1 (*)    MCH 24.4 (*)    All other components within normal limits  GASTROINTESTINAL PANEL BY PCR, STOOL (REPLACES STOOL CULTURE)  C DIFFICILE QUICK SCREEN W PCR REFLEX    LIPASE, BLOOD  URINALYSIS, ROUTINE W REFLEX MICROSCOPIC  OSMOLALITY  OSMOLALITY,  URINE  SODIUM, URINE, RANDOM  CREATININE, URINE, RANDOM  ETHANOL  MAGNESIUM   GAMMA GT  VITAMIN B12  FOLATE  IRON AND TIBC  FERRITIN  RETICULOCYTES  HEMOGLOBIN  AND HEMATOCRIT, BLOOD  HEMOGLOBIN AND HEMATOCRIT, BLOOD  HEMOGLOBIN AND HEMATOCRIT, BLOOD  HEMOGLOBIN AND HEMATOCRIT, BLOOD  HEMOGLOBIN A1C  I-STAT CG4 LACTIC ACID, ED  TYPE AND SCREEN  ABO/RH      EKG  EKG Interpretation Date/Time:    Ventricular Rate:    PR Interval:    QRS Duration:    QT Interval:    QTC Calculation:   R Axis:      Text Interpretation:           Medicines ordered and prescription drug management: Meds ordered this encounter  Medications   lactated ringers  bolus 2,000 mL   pantoprazole  (PROTONIX ) injection 40 mg   sodium chloride  flush (NS) 0.9 % injection 3 mL   DISCONTD: 0.9 %  sodium chloride  infusion   OR Linked Order Group    acetaminophen  (TYLENOL ) tablet 650 mg    acetaminophen  (TYLENOL ) suppository 650 mg   albuterol (PROVENTIL) (2.5 MG/3ML) 0.083% nebulizer solution 2.5 mg   sodium bicarbonate 150 mEq in sterile water 1,150 mL infusion    Sodium Bicarbonate 150 mEq added to 1L SWFI = 261 mOsm/L.   thiamine (VITAMIN B1) 500 mg in sodium chloride  0.9 % 50 mL IVPB    -I have reviewed the patients home medicines and have made adjustments as needed  Critical interventions Fluids, admission for renal failure  Social Determinants of Health:  Factors impacting patients care include: Good family support.    Reevaluation: After the interventions noted above, I reevaluated the patient and found that they have :stayed the same  Co morbidities that complicate the patient evaluation  Past Medical History:  Diagnosis Date   Diabetes mellitus without complication (HCC)    History of alcohol abuse    quit in 2015   Hypertension    Peripheral vascular disease       Dispostion: Discussed with hospitalist.  They will evaluate patient for admission.   Final diagnoses:  Diarrhea, unspecified type  AKI (acute kidney injury)    ED Discharge Orders     None          Hildegard Loge, PA-C 08/17/24 1345    Garrick Charleston,  MD 08/17/24 [id number]    Garrick Charleston, MD 08/17/24 [phone]

## 2024-08-17 NOTE — ED Notes (Signed)
 Pt not in room at this time. Pt family remains at bedside.

## 2024-08-17 NOTE — ED Triage Notes (Signed)
 Pt/translator stated, I started having diarrhea last Friday and everything I eat comes out. Denies any other symptoms

## 2024-08-17 NOTE — Consult Note (Signed)
 Nephrology Consult   Requesting provider: Susette Lash PA-C Service requesting consult: ER Reason for consult: AKI   Assessment/Recommendations:   AKI -baseline Cr ~0.7. Cr 7.06 on presentation -likely secondary to pre-renal insults from diarrhea with concomitant Jardiance  and lisinopril  use and with soft BP on presentation -hold home jardiance , lisinopril , metformin  -checking renal ultrasound and urine -fluids: s/p 2L LR; isotonic fluid: start bicarb @ 125 cc/hr  -Avoid nephrotoxic medications including NSAIDs and iodinated intravenous contrast exposure unless the latter is absolutely indicated.  Preferred narcotic agents for pain control are hydromorphone , fentanyl , and methadone. Morphine  should not be used. Avoid Baclofen and avoid oral sodium phosphate  and magnesium  citrate based laxatives / bowel preps. Continue strict Input and Output monitoring. Will monitor the patient closely with you and intervene or adjust therapy as indicated by changes in clinical status/labs   Diarrhea -workup & mgmt per primary service  Visual hallucinations -work up per primary service, consider CT head at the very least  Hyponatremia -suspect this is related to dehydration, checking serum+urine osm, urine na -isotonic fluids as above  Hypokalemia -likely related to diarrhea -replete PRN--use caution with repletion in the context of AKI  Metabolic acidosis -likely secondary to AKI and diarrhea -will utilize bicarb based fluids for repletion  Anemia of chronic disease -transfuse prn for Hgb <7 -monitor for now  DM2 -mgmt per primary service   Recommendations conveyed to primary service.    Ephriam Stank [facility] 08/17/2024 12:48 PM   _____________________________________________________________________________________   History of Present Illness: Patrick [name] is a/an 78 y.o. male with a past medical history of hypertension, DM2, PAD, left BKA, hyperlipidemia  who presents to [facility] ER with abdominal pain and diarrhea since Friday. Collateral history obtained from sister over the phone. They report that his diarrhea started Friday, occurs every time he eats something. 2 days prior to that, was having back pain for which he was utilizing Tylenol . They also report that as of yesterday, started having visual hallucinations, seeing things and family members that were not there. Seemed more confused. Patient currently denies any swelling, fevers, chills, chest pain, SOB, abd pain.   Medications:  No current facility-administered medications for this encounter.   Current Outpatient Medications  Medication Sig Dispense Refill   acetaminophen  (TYLENOL ) 500 MG tablet Take 1,000 mg by mouth every 8 (eight) hours as needed (pain.). (Patient not taking: Reported on 12/30/2023)     ascorbic acid  (VITAMIN C ) 1000 MG tablet Take 1 tablet (1,000 mg total) by mouth daily. (Patient not taking: Reported on 12/30/2023) 100 tablet 0   aspirin  EC 81 MG tablet Take 1 tablet (81 mg total) by mouth daily. Swallow whole. (Patient not taking: Reported on 12/30/2023) 120 tablet 2   docusate sodium  (COLACE) 100 MG capsule Take 1 capsule (100 mg total) by mouth daily. (Patient not taking: Reported on 12/30/2023) 100 capsule 0   Dulaglutide  (TRULICITY ) 0.75 MG/0.5ML SOAJ Inject 0.75 mg into the skin once a week. 2 mL 4   empagliflozin  (JARDIANCE ) 25 MG TABS tablet Take 1 tablet (25 mg total) by mouth daily before breakfast. 90 tablet 1   ferrous sulfate  325 (65 FE) MG tablet Take 1 tablet (325 mg total) by mouth daily with breakfast. 100 tablet 0   gabapentin  (NEURONTIN ) 100 MG capsule Take 2 capsules (200 mg total) by mouth 3 (three) times daily. 180 capsule 1   Insulin  Pen Needle (TECHLITE PEN NEEDLES) 32G X 4 MM MISC use up to four times daily as  directed 100 each 6   lisinopril  (ZESTRIL ) 20 MG tablet Take 1 tablet (20 mg total) by mouth every evening. 90 tablet 1   melatonin 5 MG TABS  Take 1 tablet (5 mg total) by mouth at bedtime as needed. (Patient not taking: Reported on 12/30/2023) 30 tablet 0   metFORMIN  (GLUCOPHAGE -XR) 500 MG 24 hr tablet Take 1 tablet (500 mg total) by mouth 2 (two) times daily before a meal. 180 tablet 1   oxyCODONE  (OXY IR/ROXICODONE ) 5 MG immediate release tablet Take 1 tablet (5 mg total) by mouth every 6 (six) hours as needed for severe pain. (Patient not taking: Reported on 12/30/2023) 28 tablet 0   pantoprazole  (PROTONIX ) 40 MG tablet Take 1 tablet (40 mg total) by mouth daily. (Patient not taking: Reported on 12/30/2023) 30 tablet 0   polyethylene glycol powder (GLYCOLAX /MIRALAX ) 17 GM/SCOOP powder Take 17 g by mouth 2 (two) times daily. (Patient not taking: Reported on 12/30/2023) 952 g 0   pravastatin  (PRAVACHOL ) 80 MG tablet Take 1 tablet (80 mg total) by mouth daily. 90 tablet 3   senna-docusate (SENOKOT-S) 8.6-50 MG tablet Take 2 tablets by mouth 2 (two) times daily. (Patient not taking: Reported on 12/30/2023) 120 tablet 0   sodium chloride  (OCEAN) 0.65 % SOLN nasal spray Place 1 spray into both nostrils as needed for congestion.     sodium chloride  1 g tablet Take 1 tablet (1 g total) by mouth 3 (three) times daily with meals. (Patient not taking: Reported on 12/30/2023) 90 tablet 0     ALLERGIES Patient has no known allergies.  MEDICAL HISTORY Past Medical History:  Diagnosis Date   Diabetes mellitus without complication (HCC)    History of alcohol abuse    quit in 2015   Hypertension    Peripheral vascular disease      SOCIAL HISTORY Social History   Socioeconomic History   Marital status: Single    Spouse name: Not on file   Number of children: Not on file   Years of education: Not on file   Highest education level: Not on file  Occupational History   Not on file  Tobacco Use   Smoking status: Never   Smokeless tobacco: Not on file  Vaping Use   Vaping status: Never Used  Substance and Sexual Activity   Alcohol use: Not  Currently   Drug use: No   Sexual activity: Not on file  Other Topics Concern   Not on file  Social History Narrative   Not on file   Social Drivers of Health   Financial Resource Strain: Not on file  Food Insecurity: No Food Insecurity (07/16/2023)   Hunger Vital Sign    Worried About Running Out of Food in the Last Year: Never true    Ran Out of Food in the Last Year: Never true  Transportation Needs: No Transportation Needs (07/16/2023)   PRAPARE - Administrator, Civil Service (Medical): No    Lack of Transportation (Non-Medical): No  Physical Activity: Not on file  Stress: Not on file  Social Connections: Not on file  Intimate Partner Violence: Not At Risk (07/16/2023)   Humiliation, Afraid, Rape, and Kick questionnaire    Fear of Current or Ex-Partner: No    Emotionally Abused: No    Physically Abused: No    Sexually Abused: No     FAMILY HISTORY Family History  Problem Relation Age of Onset   Diabetes Sister    Cancer Sister  Diabetes Brother      Review of Systems: 12 systems reviewed Otherwise as per HPI, all other systems reviewed and negative  Physical Exam: Vitals:   08/17/24 1230 08/17/24 1246  BP: 108/85   Pulse: 74   Resp:    Temp:    SpO2: 97% 98%   No intake/output data recorded. No intake or output data in the 24 hours ending 08/17/24 1248 General: well-appearing, no acute distress HEENT: anicteric sclera, oropharynx clear without lesions CV: regular rate, normal rhythm, no murmurs, no gallops, no rubs, no peripheral edema Lungs: clear to auscultation bilaterally, normal work of breathing Abd: soft, non-tender, non-distended Skin: no visible lesions or rashes Psych: alert, engaged, appropriate mood and affect Musculoskeletal: left BKA, no obvious deformities Neuro: normal speech, no gross focal deficits   Test Results Reviewed Lab Results  Component Value Date   NA 128 (L) 08/17/2024   K 3.1 (L) 08/17/2024   CL 96 (L)  08/17/2024   CO2 13 (L) 08/17/2024   BUN 118 (H) 08/17/2024   CREATININE 7.06 (H) 08/17/2024   CALCIUM  8.4 (L) 08/17/2024   ALBUMIN 3.5 08/17/2024   PHOS 3.2 10/25/2021     I have reviewed all relevant outside healthcare records related to the patient's kidney injury.

## 2024-08-17 NOTE — Plan of Care (Signed)

## 2024-08-17 NOTE — ED Triage Notes (Signed)
Pt reports abdominal pain and diarrhea.

## 2024-08-17 NOTE — H&P (Signed)
 History and Physical    Patient: Patrick [name] FMW:[id number] DOB: [birth year] DOA: 08/17/2024 DOS: the patient was seen and examined on 08/17/2024 . PCP: Celestia Rosaline SQUIBB, NP  Patient coming from: Home Chief complaint: Chief Complaint  Patient presents with   Diarrhea   HPI:  Patrick [name] is a 78 y.o. male with past medical history  of diabetes mellitus type 2, history of alcohol abuse, history of gangrene and amputations, history of acute blood loss anemia and GI bleed, presenting today with diarrhea for the past 5 days.  Patient also reports abdominal pain no other symptoms of nausea or vomiting or rashes. No alcohol since about 8 years. He did report black stool once this time.    ED Course:  Vital signs in the ED were notable for the following:  Vitals:   08/17/24 1200 08/17/24 1215 08/17/24 1230 08/17/24 1246  BP: 101/64 (!) 101/59 108/85   Pulse: 78 77 74   Temp:      Resp:      SpO2: 98% 97% 97% 98%  TempSrc:       >>ED evaluation thus far shows: -Abnormal CMP with electrolyte abnormalities of sodium 128 potassium 3.1 bicarb of 13 anion gap of 19 BUN 118 creatinine of 7.06 normal LFTs. -Abnormal CBC with anemia hemoglobin of 10.1 MCV 77.1 white count 6.1, platelets 238. EKG ordered and pending. Stool studies ordered and pending.   >>While in the ED patient received the following: Medications  lactated ringers  bolus 2,000 mL (2,000 mLs Intravenous New Bag/Given 08/17/24 1155)   Review of Systems  Respiratory:  Positive for sputum production.   Gastrointestinal:  Positive for abdominal pain and diarrhea.   Past Medical History:  Diagnosis Date   Diabetes mellitus without complication (HCC)    History of alcohol abuse    quit in 2015   Hypertension    Peripheral vascular disease    Past Surgical History:  Procedure Laterality Date   ABDOMINAL AORTOGRAM W/LOWER EXTREMITY Left 05/16/2023   Procedure: ABDOMINAL AORTOGRAM W/LOWER EXTREMITY;   Surgeon: Magda Debby SAILOR, MD;  Location: [facility];  Service: Cardiovascular;  Laterality: Left;   AMPUTATION Left 07/04/2023   Procedure: LEFT 4TH TOE AMPUTATION;  Surgeon: Harden Jerona GAILS, MD;  Location: [facility] OR;  Service: Orthopedics;  Laterality: Left;   AMPUTATION Left 07/16/2023   Procedure: LEFT BELOW KNEE AMPUTATION;  Surgeon: Harden Jerona GAILS, MD;  Location: [facility] OR;  Service: Orthopedics;  Laterality: Left;   APPLICATION OF WOUND VAC Left 07/16/2023   Procedure: APPLICATION OF WOUND VAC;  Surgeon: Harden Jerona GAILS, MD;  Location: [facility] OR;  Service: Orthopedics;  Laterality: Left;   I & D EXTREMITY Left 10/24/2021   Procedure: IRRIGATION AND DEBRIDEMENT OF KNEE AND EXCISION PRE-PATELLA BURSA;  Surgeon: Doll Skates, MD;  Location: [facility] OR;  Service: Orthopedics;  Laterality: Left;   PERIPHERAL VASCULAR BALLOON ANGIOPLASTY Left 05/16/2023   Procedure: PERIPHERAL VASCULAR BALLOON ANGIOPLASTY;  Surgeon: Magda Debby SAILOR, MD;  Location: [facility];  Service: Cardiovascular;  Laterality: Left;  left AT and left peroneal    reports that he has never smoked. He does not have any smokeless tobacco history on file. He reports that he does not currently use alcohol. He reports that he does not use drugs. No Known Allergies Family History  Problem Relation Age of Onset   Diabetes Sister    Cancer Sister    Diabetes Brother    Prior to Admission medications   Medication  Sig Start Date End Date Taking? Authorizing Provider  acetaminophen  (TYLENOL ) 500 MG tablet Take 1,000 mg by mouth every 8 (eight) hours as needed (pain.). Patient not taking: Reported on 12/30/2023    [provider]  ascorbic acid  (VITAMIN C ) 1000 MG tablet Take 1 tablet (1,000 mg total) by mouth daily. Patient not taking: Reported on 12/30/2023 08/01/23   Love, Sharlet RAMAN, PA-C  aspirin  EC 81 MG tablet Take 1 tablet (81 mg total) by mouth daily. Swallow whole. Patient not taking: Reported on 12/30/2023 05/16/23   Magda Debby SAILOR, MD  docusate sodium  (COLACE) 100 MG capsule Take 1 capsule (100 mg total) by mouth daily. Patient not taking: Reported on 12/30/2023 08/01/23   Love, Sharlet RAMAN, PA-C  Dulaglutide  (TRULICITY ) 0.75 MG/0.5ML SOAJ Inject 0.75 mg into the skin once a week. 03/31/24   Celestia Rosaline SQUIBB, NP  empagliflozin  (JARDIANCE ) 25 MG TABS tablet Take 1 tablet (25 mg total) by mouth daily before breakfast. 03/31/24   Celestia Rosaline SQUIBB, NP  ferrous sulfate  325 (65 FE) MG tablet Take 1 tablet (325 mg total) by mouth daily with breakfast. 04/02/24   Celestia Rosaline SQUIBB, NP  gabapentin  (NEURONTIN ) 100 MG capsule Take 2 capsules (200 mg total) by mouth 3 (three) times daily. 07/09/24   Celestia Rosaline SQUIBB, NP  Insulin  Pen Needle (TECHLITE PEN NEEDLES) 32G X 4 MM MISC use up to four times daily as directed 03/06/23   Celestia Rosaline SQUIBB, NP  lisinopril  (ZESTRIL ) 20 MG tablet Take 1 tablet (20 mg total) by mouth every evening. 03/31/24   Celestia Rosaline SQUIBB, NP  melatonin 5 MG TABS Take 1 tablet (5 mg total) by mouth at bedtime as needed. Patient not taking: Reported on 12/30/2023 07/31/23   Love, Sharlet RAMAN, PA-C  metFORMIN  (GLUCOPHAGE -XR) 500 MG 24 hr tablet Take 1 tablet (500 mg total) by mouth 2 (two) times daily before a meal. 03/31/24   Celestia Rosaline SQUIBB, NP  oxyCODONE  (OXY IR/ROXICODONE ) 5 MG immediate release tablet Take 1 tablet (5 mg total) by mouth every 6 (six) hours as needed for severe pain. Patient not taking: Reported on 12/30/2023 07/31/23   Love, Sharlet RAMAN, PA-C  pantoprazole  (PROTONIX ) 40 MG tablet Take 1 tablet (40 mg total) by mouth daily. Patient not taking: Reported on 12/30/2023 08/01/23   Love, Sharlet RAMAN, PA-C  polyethylene glycol powder (GLYCOLAX /MIRALAX ) 17 GM/SCOOP powder Take 17 g by mouth 2 (two) times daily. Patient not taking: Reported on 12/30/2023 07/31/23   Love, Sharlet RAMAN, PA-C  pravastatin  (PRAVACHOL ) 80 MG tablet Take 1 tablet (80 mg total) by mouth daily. 04/02/24   Celestia Rosaline SQUIBB, NP   senna-docusate (SENOKOT-S) 8.6-50 MG tablet Take 2 tablets by mouth 2 (two) times daily. Patient not taking: Reported on 12/30/2023 07/31/23   Love, Sharlet RAMAN, PA-C  sodium chloride  (OCEAN) 0.65 % SOLN nasal spray Place 1 spray into both nostrils as needed for congestion.    [provider]  sodium chloride  1 g tablet Take 1 tablet (1 g total) by mouth 3 (three) times daily with meals. Patient not taking: Reported on 12/30/2023 07/31/23   Maurice Sharlet RAMAN, PA-C  Vitals:   08/17/24 1200 08/17/24 1215 08/17/24 1230 08/17/24 1246  BP: 101/64 (!) 101/59 108/85   Pulse: 78 77 74   Resp:      Temp:      TempSrc:      SpO2: 98% 97% 97% 98%   Physical Exam Vitals reviewed.  Constitutional:      General: He is not in acute distress.    Appearance: He is not ill-appearing.  HENT:     Head: Normocephalic.  Eyes:     Extraocular Movements: Extraocular movements intact.  Cardiovascular:     Rate and Rhythm: Normal rate and regular rhythm.     Heart sounds: Normal heart sounds.  Pulmonary:     Breath sounds: Normal breath sounds.  Abdominal:     General: There is no distension.     Palpations: Abdomen is soft.     Tenderness: There is no abdominal tenderness.  Musculoskeletal:     Right lower leg: No edema.     Left Lower Extremity: Left leg is amputated below knee.  Neurological:     General: No focal deficit present.     Mental Status: He is alert and oriented to person, place, and time.     Labs on Admission: I have personally reviewed following labs and imaging studies CBC: Recent Labs  Lab 08/17/24 0759  WBC 6.1  HGB 10.1*  HCT 31.9*  MCV 77.1*  PLT 238   Basic Metabolic Panel: Recent Labs  Lab 08/17/24 0759  NA 128*  K 3.1*  CL 96*  CO2 13*  GLUCOSE 140*  BUN 118*  CREATININE 7.06*  CALCIUM  8.4*   GFR: CrCl cannot be calculated (Unknown ideal weight.). Liver Function  Tests: Recent Labs  Lab 08/17/24 0759  AST 21  ALT 15  ALKPHOS 67  BILITOT 0.7  PROT 7.2  ALBUMIN 3.5   Recent Labs  Lab 08/17/24 0759  LIPASE 34   No results for input(s): AMMONIA in the last 168 hours. Recent Labs    03/31/24 1059 08/17/24 0759  BUN 14 118*  CREATININE 0.76 7.06*    Cardiac Enzymes: No results for input(s): CKTOTAL, CKMB, CKMBINDEX, TROPONINI in the last 168 hours. BNP (last 3 results) No results for input(s): PROBNP in the last 8760 hours. HbA1C: No results for input(s): HGBA1C in the last 72 hours. CBG: No results for input(s): GLUCAP in the last 168 hours. Lipid Profile: No results for input(s): CHOL, HDL, LDLCALC, TRIG, CHOLHDL, LDLDIRECT in the last 72 hours. Thyroid Function Tests: No results for input(s): TSH, T4TOTAL, FREET4, T3FREE, THYROIDAB in the last 72 hours. Anemia Panel: No results for input(s): VITAMINB12, FOLATE, FERRITIN, TIBC, IRON, RETICCTPCT in the last 72 hours. Urine analysis: No results found for: COLORURINE, APPEARANCEUR, LABSPEC, PHURINE, GLUCOSEU, HGBUR, BILIRUBINUR, KETONESUR, PROTEINUR, UROBILINOGEN, NITRITE, LEUKOCYTESUR Radiological Exams on Admission: No results found. Data Reviewed: Relevant notes from primary care and specialist visits, past discharge summaries as available in EHR, including Care Everywhere . Prior diagnostic testing as pertinent to current admission diagnoses, Updated medications and problem lists for reconciliation .ED course, including vitals, labs, imaging, treatment and response to treatment,Triage notes, nursing and pharmacy notes and ED provider's notes.Notable results as noted in HPI.Discussed case with EDMD/ ED APP/ or Specialty MD on call and as needed.  Assessment & Plan   >>Abdominal pain / Diarrhea: D/D include infectious diarrhea or gastroenteritis , colitis, GIB Prn tylenol .  Ethanol level ordered.  Ct abd  and pelvis non contrast , renal  usg ordered.  Stool culture ordered.  >>Disorientation: D/d include TIA/ uremia related.  Head ct.   >>AKI: Lab Results  Component Value Date   CREATININE 7.06 (H) 08/17/2024   CREATININE 0.76 03/31/2024   CREATININE 0.63 08/02/2023  Nephrology consulted by edmd and appreciate consult and management.  ? If shiga toxin related AKI, will get stool cultures.    >>Metabolic acidosis: 2/2 to AKI/ lactic acidosis .   >>Hypokalemia: Mild will replace and follow.    >>Anemia: Type/screen.  Based on MCV is microcytic suspect combination of either chronic GI loss or alcohol related.    Latest Ref Rng & Units 08/17/2024    7:59 AM 03/31/2024   10:59 AM 08/02/2023   10:34 AM  CBC  WBC 4.0 - 10.5 K/uL 6.1  5.4  5.3   Hemoglobin 13.0 - 17.0 g/dL 89.8  88.1  9.9   Hematocrit 39.0 - 52.0 % 31.9  37.2  31.3   Platelets 150 - 400 K/uL 238  220  262   Will obtain an anemia panel and stool occult as well and IV PPI.   DVT prophylaxis:  Scd's  Consults:  Nephrology.   Advance Care Planning:    Code Status: Full Code   Family Communication:  None.  Disposition Plan:  Home.  Severity of Illness: The appropriate patient status for this patient is INPATIENT. Inpatient status is judged to be reasonable and necessary in order to provide the required intensity of service to ensure the patient's safety. The patient's presenting symptoms, physical exam findings, and initial radiographic and laboratory data in the context of their chronic comorbidities is felt to place them at high risk for further clinical deterioration. Furthermore, it is not anticipated that the patient will be medically stable for discharge from the hospital within 2 midnights of admission.   * I certify that at the point of admission it is my clinical judgment that the patient will require inpatient hospital care spanning beyond 2 midnights from the point of admission due to high intensity of  service, high risk for further deterioration and high frequency of surveillance required.*  Unresulted Labs (From admission, onward)     Start     Ordered   08/18/24 0500  Occult blood card to lab, stool RN will collect  Daily,   R     Question:  Specimen to be collected by:  Answer:  RN will collect   08/17/24 1304   08/18/24 0500  Comprehensive metabolic panel  Tomorrow morning,   R        08/17/24 1319   08/18/24 0500  CBC  Tomorrow morning,   R        08/17/24 1319   08/17/24 1351  Gamma GT  Once,   AD        08/17/24 1351   08/17/24 1351  Magnesium   Once,   AD        08/17/24 1351   08/17/24 1351  Hemoglobin and hematocrit, blood  Once,   TIMED        08/17/24 1351   08/17/24 1351  Gamma GT  Once,   AD        08/17/24 1351   08/17/24 1351  Magnesium   Once,   AD        08/17/24 1351   08/17/24 1351  Hemoglobin and hematocrit, blood  Once,   TIMED        08/17/24 1351   08/17/24 1317  Hemoglobin and hematocrit,  blood  Now then every 4 hours,   R      08/17/24 1319   08/17/24 1317  Hemoglobin A1c  Once,   R       Comments: To assess prior glycemic control    08/17/24 1319   08/17/24 1307  Vitamin B12  (Anemia Panel (PNL))  Once,   URGENT        08/17/24 1306   08/17/24 1307  Folate  (Anemia Panel (PNL))  Once,   URGENT        08/17/24 1306   08/17/24 1307  Iron and TIBC  (Anemia Panel (PNL))  Once,   URGENT        08/17/24 1306   08/17/24 1307  Ferritin  (Anemia Panel (PNL))  Once,   URGENT        08/17/24 1306   08/17/24 1307  Reticulocytes  (Anemia Panel (PNL))  Once,   URGENT        08/17/24 1306   08/17/24 1304  Gastrointestinal Panel by PCR , Stool  (Gastrointestinal Panel by PCR, Stool                                                                                                                                                     **Does Not include CLOSTRIDIUM DIFFICILE testing. **If CDIFF testing is needed, place order from the C Difficile Testing order set.**)   Once,   URGENT        08/17/24 1304   08/17/24 1304  C Difficile Quick Screen w PCR reflex  (C Difficile quick screen w PCR reflex panel )  Once, for 24 hours,   URGENT       References:    CDiff Information Tool   08/17/24 1304   08/17/24 1303  Ethanol  Add-on,   AD        08/17/24 1304   08/17/24 1259  Osmolality  Once,   URGENT        08/17/24 1259   08/17/24 1259  Osmolality, urine  Once,   URGENT        08/17/24 1259   08/17/24 1259  Sodium, urine, random  Once,   URGENT        08/17/24 1259   08/17/24 1259  Creatinine, urine, random  Once,   URGENT        08/17/24 1259   08/17/24 0752  Urinalysis, Routine w reflex microscopic -Urine, Clean Catch  Once,   URGENT       Question:  Specimen Source  Answer:  Urine, Clean Catch   08/17/24 0751            Meds ordered this encounter  Medications   lactated ringers  bolus 2,000 mL   pantoprazole  (PROTONIX ) injection 40 mg   sodium chloride  flush (NS) 0.9 % injection 3  mL   DISCONTD: 0.9 %  sodium chloride  infusion   OR Linked Order Group    acetaminophen  (TYLENOL ) tablet 650 mg    acetaminophen  (TYLENOL ) suppository 650 mg   albuterol (PROVENTIL) (2.5 MG/3ML) 0.083% nebulizer solution 2.5 mg   sodium bicarbonate 150 mEq in sterile water 1,150 mL infusion    Sodium Bicarbonate 150 mEq added to 1L SWFI = 261 mOsm/L.   thiamine (VITAMIN B1) 500 mg in sodium chloride  0.9 % 50 mL IVPB     Orders Placed This Encounter  Procedures   Gastrointestinal Panel by PCR , Stool   C Difficile Quick Screen w PCR reflex   US  RENAL   CT Head Wo Contrast   CT ABDOMEN PELVIS WO CONTRAST   Lipase, blood   Comprehensive metabolic panel   CBC   Urinalysis, Routine w reflex microscopic -Urine, Clean Catch   Osmolality   Osmolality, urine   Sodium, urine, random   Creatinine, urine, random   Ethanol   Occult blood card to lab, stool RN will collect   Vitamin B12   Folate   Iron and TIBC   Ferritin   Reticulocytes   Hemoglobin and  hematocrit, blood   Hemoglobin A1c   Comprehensive metabolic panel   CBC   Gamma GT   Magnesium    Hemoglobin and hematocrit, blood   Gamma GT   Magnesium    Hemoglobin and hematocrit, blood   Diet NPO time specified   Bladder scan   Apply Diabetes Mellitus Care Plan   STAT CBG when hypoglycemia is suspected. If treated, recheck every 15 minutes after each treatment until CBG >/= 70 mg/dl   Refer to Hypoglycemia Protocol Sidebar Report for treatment of CBG < 70 mg/dl   Maintain IV access   Vital signs   Notify physician (specify)   Mobility Protocol: No Restrictions   Refer to Sidebar Report Mobility Protocol for Adult Inpatient   Initiate Adult Central Line Maintenance and Catheter Protocol for patients with central line (CVC, PICC, Port, Hemodialysis, Trialysis)   Daily weights   Intake and Output   Initiate CHG Protocol   Do not place and if present remove PureWick   Initiate Oral Care Protocol   Initiate Carrier Fluid Protocol   RN may order General Admission PRN Orders utilizing General Admission PRN medications (through manage orders) for the following patient needs: allergy symptoms (Claritin ), cold sores (Carmex), cough (Robitussin DM), eye irritation (Liquifilm Tears), hemorrhoids (Tucks), indigestion (Maalox), minor skin irritation (Hydrocortisone Cream), muscle pain (Ben Gay), nose irritation (saline nasal spray) and sore throat (Chloraseptic spray).   Cardiac Monitoring - Continuous Indefinite   Ambulate with assistance   Bladder scan   Strict intake and output   Full code   Consult to nephrology   Consult to hospitalist   Enteric precautions (UV disinfection)   Pulse oximetry check with vital signs   Oxygen therapy Mode or (Route): Nasal cannula; Liters Per Minute: 2; Keep O2 saturation between: greater than 92 %   I-Stat CG4 Lactic Acid   EKG 12-Lead   Type and screen   ABO/Rh   Place in observation (patient's expected length of stay will be less than 2  midnights)   Aspiration precautions    Author: Loghan LULLA Blanch, MD 12 pm- 8 pm. [facility]. 08/17/2024 2:02 PM Please note for any communication after hours contact [facility] Assigned provider on call on Amion.

## 2024-08-18 ENCOUNTER — Encounter (HOSPITAL_COMMUNITY): Payer: Self-pay | Admitting: Internal Medicine

## 2024-08-18 LAB — CBC
HCT: 28.1 % — ABNORMAL LOW (ref 39.0–52.0)
Hemoglobin: 9.4 g/dL — ABNORMAL LOW (ref 13.0–17.0)
MCH: 24.4 pg — ABNORMAL LOW (ref 26.0–34.0)
MCHC: 33.5 g/dL (ref 30.0–36.0)
MCV: 73 fL — ABNORMAL LOW (ref 80.0–100.0)
Platelets: 195 K/uL (ref 150–400)
RBC: 3.85 MIL/uL — ABNORMAL LOW (ref 4.22–5.81)
RDW: 15 % (ref 11.5–15.5)
WBC: 4 K/uL (ref 4.0–10.5)
nRBC: 0 % (ref 0.0–0.2)

## 2024-08-18 LAB — COMPREHENSIVE METABOLIC PANEL WITH GFR
ALT: 12 U/L (ref 0–44)
AST: 18 U/L (ref 15–41)
Albumin: 3 g/dL — ABNORMAL LOW (ref 3.5–5.0)
Alkaline Phosphatase: 57 U/L (ref 38–126)
Anion gap: 13 (ref 5–15)
BUN: 109 mg/dL — ABNORMAL HIGH (ref 8–23)
CO2: 21 mmol/L — ABNORMAL LOW (ref 22–32)
Calcium: 8.4 mg/dL — ABNORMAL LOW (ref 8.9–10.3)
Chloride: 99 mmol/L (ref 98–111)
Creatinine, Ser: 4.06 mg/dL — ABNORMAL HIGH (ref 0.61–1.24)
GFR, Estimated: 15 mL/min — ABNORMAL LOW (ref >60)
Glucose, Bld: 100 mg/dL — ABNORMAL HIGH (ref 70–99)
Potassium: 2.9 mmol/L — ABNORMAL LOW (ref 3.5–5.1)
Sodium: 133 mmol/L — ABNORMAL LOW (ref 135–145)
Total Bilirubin: 0.8 mg/dL (ref 0.0–1.2)
Total Protein: 6.4 g/dL — ABNORMAL LOW (ref 6.5–8.1)

## 2024-08-18 LAB — URINALYSIS, ROUTINE W REFLEX MICROSCOPIC
Bilirubin Urine: NEGATIVE
Glucose, UA: NEGATIVE mg/dL
Hgb urine dipstick: NEGATIVE
Ketones, ur: NEGATIVE mg/dL
Leukocytes,Ua: NEGATIVE
Nitrite: NEGATIVE
Protein, ur: NEGATIVE mg/dL
Specific Gravity, Urine: 1.009 (ref 1.005–1.030)
pH: 5 (ref 5.0–8.0)

## 2024-08-18 LAB — GASTROINTESTINAL PANEL BY PCR, STOOL (REPLACES STOOL CULTURE)
Adenovirus F40/41: NOT DETECTED
Astrovirus: NOT DETECTED
Campylobacter species: NOT DETECTED
Cryptosporidium: NOT DETECTED
Cyclospora cayetanensis: NOT DETECTED
Entamoeba histolytica: NOT DETECTED
Enteroaggregative E coli (EAEC): NOT DETECTED
Enteropathogenic E coli (EPEC): DETECTED — AB
Enterotoxigenic E coli (ETEC): DETECTED — AB
Giardia lamblia: NOT DETECTED
Norovirus GI/GII: NOT DETECTED
Plesimonas shigelloides: NOT DETECTED
Rotavirus A: NOT DETECTED
Salmonella species: DETECTED — AB
Sapovirus (I, II, IV, and V): NOT DETECTED
Shiga like toxin producing E coli (STEC): NOT DETECTED
Shigella/Enteroinvasive E coli (EIEC): NOT DETECTED
Vibrio cholerae: NOT DETECTED
Vibrio species: NOT DETECTED
Yersinia enterocolitica: NOT DETECTED

## 2024-08-18 LAB — CREATININE, URINE, RANDOM: Creatinine, Urine: 91 mg/dL

## 2024-08-18 LAB — HEMOGLOBIN AND HEMATOCRIT, BLOOD
HCT: 26.7 % — ABNORMAL LOW (ref 39.0–52.0)
HCT: 25.6 % — ABNORMAL LOW (ref 39.0–52.0)
HCT: 26.5 % — ABNORMAL LOW (ref 39.0–52.0)
HCT: 25.9 % — ABNORMAL LOW (ref 39.0–52.0)
Hemoglobin: 8.9 g/dL — ABNORMAL LOW (ref 13.0–17.0)
Hemoglobin: 8.4 g/dL — ABNORMAL LOW (ref 13.0–17.0)
Hemoglobin: 8.6 g/dL — ABNORMAL LOW (ref 13.0–17.0)
Hemoglobin: 8.4 g/dL — ABNORMAL LOW (ref 13.0–17.0)

## 2024-08-18 LAB — MAGNESIUM: Magnesium: 2.5 mg/dL — ABNORMAL HIGH (ref 1.7–2.4)

## 2024-08-18 LAB — C DIFFICILE QUICK SCREEN W PCR REFLEX
C Diff antigen: NEGATIVE
C Diff interpretation: NOT DETECTED
C Diff toxin: NEGATIVE

## 2024-08-18 LAB — OSMOLALITY, URINE: Osmolality, Ur: 288 mosm/kg — ABNORMAL LOW (ref 300–900)

## 2024-08-18 LAB — SODIUM, URINE, RANDOM: Sodium, Ur: <30 mmol/L

## 2024-08-18 MED ORDER — LACTATED RINGERS IV SOLN: INTRAVENOUS | Status: AC

## 2024-08-18 MED ORDER — SODIUM CHLORIDE 0.9 % IV SOLN
2.0000 g | Freq: Every day | INTRAVENOUS | Status: DC
Start: 2024-08-18 — End: 2024-08-21
  Administered 2024-08-18 – 2024-08-19 (×2): 2 g via INTRAVENOUS
  Filled 2024-08-18 (×2): qty 20

## 2024-08-18 MED ORDER — POTASSIUM CHLORIDE 20 MEQ PO PACK
60.0000 meq | PACK | Freq: Once | ORAL | Status: AC
  Administered 2024-08-18: 60 meq via ORAL
  Filled 2024-08-18: qty 3

## 2024-08-18 MED ORDER — POTASSIUM CHLORIDE 20 MEQ PO PACK: 40.0000 meq | PACK | Freq: Once | ORAL | Status: DC

## 2024-08-18 NOTE — Progress Notes (Signed)
 [facility] Progress Note    Assessment/ Plan:   AKI -baseline Cr ~0.7. Cr 7.06 on presentation, down to 4.06 -likely secondary to pre-renal insults from diarrhea with concomitant Jardiance  and lisinopril  use and with soft BP on presentation. No obstruction on imaging -hold home jardiance , lisinopril , metformin  -UA pending -c/w supportive care, c/w IVF: changed to LR x1 day--can continue if needed. Encouraged PO hydration -Avoid nephrotoxic medications including NSAIDs and iodinated intravenous contrast exposure unless the latter is absolutely indicated.  Preferred narcotic agents for pain control are hydromorphone , fentanyl , and methadone. Morphine  should not be used. Avoid Baclofen and avoid oral sodium phosphate  and magnesium  citrate based laxatives / bowel preps. Continue strict Input and Output monitoring. Will monitor the patient closely with you and intervene or adjust therapy as indicated by changes in clinical status/labs    Diarrhea -workup & mgmt per primary service   Visual hallucinations -work up per primary service, CTH negative   Hyponatremia -suspect this is related to dehydration, checking serum+urine osm, urine na -isotonic fluids as above   Hypokalemia -likely related to diarrhea -replete PRN, stopped bicarb gtt. Klorcon 60meq ordered for today   Metabolic acidosis -likely secondary to AKI and diarrhea -improved, stopped bicarb gtt, switching to LR   Anemia of chronic disease -transfuse prn for Hgb <7 -per primary   DM2 -mgmt per primary service  Continue with supportive care. Nothing else to add from an inpatient nephrology perspective. Will sign off. Please call when patient is closer to discharge therefore we can arrange for outpatient follow up at our office.  Subjective:   Patient seen and examined bedside. Daughter at bedside providing translation. Diarrhea improved. He reports that he is urinating a lot more. No complaints otherwise.    Objective:   BP 134/76 (BP Location: Right Arm)   Pulse 72   Temp 98.1 F (36.7 C)   Resp 20   SpO2 97%   Intake/Output Summary (Last 24 hours) at 08/18/2024 0855 Last data filed at 08/18/2024 0645 Gross per 24 hour  Intake 1431.6 ml  Output --  Net 1431.6 ml   Weight change:   Physical Exam: Gen: NAD CVS: RRR Resp: normal wob, unlabored Abd: soft, NT/ND Ext: no RLE edema, left BKA Neuro: awake, alert  Imaging: CT ABDOMEN PELVIS WO CONTRAST Result Date: 08/17/2024 CLINICAL DATA:  AMS EXAM: CT ABDOMEN AND PELVIS WITHOUT CONTRAST TECHNIQUE: Multidetector CT imaging of the abdomen and pelvis was performed following the standard protocol without IV contrast. RADIATION DOSE REDUCTION: This exam was performed according to the departmental dose-optimization program which includes automated exposure control, adjustment of the mA and/or kV according to patient size and/or use of iterative reconstruction technique. COMPARISON:  08/17/2024 renal ultrasound FINDINGS: Of note, the lack of intravenous contrast limits evaluation of the solid organ parenchyma and vascularity. Lower chest: No focal airspace consolidation or pleural effusion. Posterior bibasilar dependent atelectasis. Extensive, dense multi-vessel coronary atherosclerosis. Hepatobiliary: No mass.No radiopaque stones or wall thickening of the gallbladder. No intrahepatic or extrahepatic biliary ductal dilation. Pancreas: No mass or main ductal dilation. No peripancreatic inflammation or fluid collection. Spleen: Normal size. No mass. Adrenals/Urinary Tract: No adrenal masses. No renal mass. No hydronephrosis or nephrolithiasis. The urinary bladder is distended without focal abnormality. Stomach/Bowel: The stomach is decompressed without focal abnormality. No small bowel wall thickening or inflammation. No small bowel obstruction.The appendix was not visualized. No right lower quadrant or pericecal inflammatory changes to suggest acute  appendicitis. Vascular/Lymphatic: No aortic aneurysm. Diffuse aortoiliac  atherosclerosis. No intraabdominal or pelvic lymphadenopathy. Reproductive: No prostatomegaly. No free pelvic fluid. Other: No pneumoperitoneum, ascites, or mesenteric inflammation. Musculoskeletal: No acute fracture or destructive lesion. IMPRESSION: No acute intra-abdominal or pelvic abnormality. Aortic Atherosclerosis (ICD10-I70.0). Electronically Signed   By: Rogelia Myers M.D.   On: 08/17/2024 16:04   CT Head Wo Contrast Result Date: 08/17/2024 EXAM: CT HEAD WITHOUT CONTRAST 08/17/2024 03:42:51 PM TECHNIQUE: CT of the head was performed without the administration of intravenous contrast. Automated exposure control, iterative reconstruction, and/or weight based adjustment of the mA/kV was utilized to reduce the radiation dose to as low as reasonably achievable. COMPARISON: None available. CLINICAL HISTORY: disorientation FINDINGS: BRAIN AND VENTRICLES: No acute hemorrhage. No evidence of acute infarct. No hydrocephalus. No extra-axial collection. No mass effect or midline shift. Sclerosis of the carotid siphons. ORBITS: Chronic appearing deformity of the right lamina papyracea. Bilateral lens replacement. SINUSES: Mucosal thickening throughout the ethmoid air cells. SOFT TISSUES AND SKULL: No acute soft tissue abnormality. No skull fracture. Left mastoid effusion. IMPRESSION: 1. No acute intracranial abnormality. Electronically signed by: Donnice Mania MD 08/17/2024 03:56 PM EDT RP Workstation: [id number]   US  RENAL Result Date: 08/17/2024 CLINICAL DATA:  Acute kidney injury EXAM: RENAL / URINARY TRACT ULTRASOUND COMPLETE COMPARISON:  None available FINDINGS: Right Kidney: Renal measurements: 11.2 x 4.9 x 5.7 cm = volume: 164 mL. Echogenicity within normal limits. No mass or hydronephrosis visualized. Left Kidney: Renal measurements: 10.9 x 6.9 x 5.7 cm = volume: 225 mL. Echogenicity within normal limits. No mass or hydronephrosis  visualized. Bladder: Appears normal for degree of bladder distention. Other: None. IMPRESSION: No significant sonographic abnormality of the kidneys. Electronically Signed   By: Aliene Lloyd M.D.   On: 08/17/2024 14:33    Labs: BMET Recent Labs  Lab 08/17/24 0759 08/18/24 0415  NA 128* 133*  K 3.1* 2.9*  CL 96* 99  CO2 13* 21*  GLUCOSE 140* 100*  BUN 118* 109*  CREATININE 7.06* 4.06*  CALCIUM  8.4* 8.4*   CBC Recent Labs  Lab 08/17/24 0759 08/17/24 1325 08/17/24 1741 08/17/24 2124 08/18/24 0415  WBC 6.1  --   --   --  4.0  HGB 10.1* 9.3* 9.5* 8.7* 9.4*  HCT 31.9* 29.7* 29.1* 26.1* 28.1*  MCV 77.1*  --   --   --  73.0*  PLT 238  --   --   --  195    Medications:     pantoprazole  (PROTONIX ) IV  40 mg Intravenous Q12H   potassium chloride   60 mEq Oral Once   sodium chloride  flush  3 mL Intravenous Q12H      Ephriam Stank, MD [facility] 08/18/2024, 8:55 AM

## 2024-08-18 NOTE — Progress Notes (Signed)
 PROGRESS NOTE    Patrick [name]  FMW:[id number] DOB: June 24, 1946 DOA: 08/17/2024 PCP: Celestia Rosaline SQUIBB, NP   Brief Narrative:  This 78 years old Male with PMH significant for type 2 diabetes, history of alcohol abuse, history of gangrene and amputations, history of acute blood loss anemia ,  GI bleed presented in the ED with complaints of diarrhea for last 5 days.  Patient also reports having abdominal pain,  denies any nausea and vomiting.  Patient has not had alcohol within the last 8 years.  He also reports black stools once during this time.  He is found to have electrolyte imbalance in the ED.  Admitted for further evaluation.  Assessment & Plan:   Principal Problem:   Diarrhea  Abdominal pain / Diarrhea: Salmonella/E. coli gastroenteritis: Patient presented with abdominal pain associated with diarrhea for last 5 days. Differential diagnosis include infectious diarrhea or gastroenteritis, colitis. Ethanol level within normal limits. CT abdomen and pelvis unremarkable. Stool for C. difficile negative. GI panel positive for Salmonella/ E. Coli. Continue IV ceftriaxone for now. Continue adequate pain control. Continue IV hydration. Patient reports no diarrhea since yesterday improving.  Altered mental status: Likely due to infection.  CT head negative. Mental status back to baseline.  Metabolic acidosis: Likely due to AKI/lactic acidosis. Improving with IV hydration.  Hypokalemia. Replaced.  Continue to monitor  Acute kidney injury: Likely in the setting of dehydration. Baseline serum creatinine normal.  Presented with creatinine of 7.06. Continue IV hydration.  Serum creatinine improving. Avoid Nephrotoxic medications.  DVT prophylaxis: SCDs Code Status: Full Code Family Communication: No family at bed side Disposition Plan:    Status is: Inpatient Remains inpatient appropriate because: Severity of illness.   Consultants:  Nephrology  Procedures:CT  A/P  Antimicrobials:  Anti-infectives (From admission, onward)    Start     Dose/Rate Route Frequency Ordered Stop   08/18/24 1415  cefTRIAXone (ROCEPHIN) 2 g in sodium chloride  0.9 % 100 mL IVPB        2 g 200 mL/hr over 30 Minutes Intravenous Daily 08/18/24 1324 08/21/24 0959      Subjective: Patient was seen and examined at bedside. Overnight events noted. Patient reports feeling improved, denies any chest pain or shortness of breath. Patient reports diarrhea is improving.  Objective: Vitals:   08/17/24 2046 08/18/24 0811 08/18/24 1537 08/18/24 1538  BP: 114/62 134/76 134/76 134/76  Pulse: 73 72 72 72  Resp: 18 20 20 20   Temp: 98.1 F (36.7 C)  98.1 F (36.7 C) 98.1 F (36.7 C)  TempSrc:   Oral   SpO2: 98% 97%    Weight:    60.7 kg  Height:    5' 1 (1.549 m)    Intake/Output Summary (Last 24 hours) at 08/18/2024 1556 Last data filed at 08/18/2024 1509 Gross per 24 hour  Intake 1734.43 ml  Output --  Net 1734.43 ml   Filed Weights   08/18/24 1538  Weight: 60.7 kg    Examination:  General exam: Appears calm and comfortable, not in any acute distress. Respiratory system: Clear to auscultation. Respiratory effort normal.  RR 14 Cardiovascular system: S1 & S2 heard, RRR. No JVD, murmurs, rubs, gallops or clicks.  Gastrointestinal system: Abdomen is nondistended, soft and nontender. Normal bowel sounds heard. Central nervous system: Alert and oriented x 3. No focal neurological deficits. Extremities: No edema, no cyanosis, no clubbing. Skin: No rashes, lesions or ulcers Psychiatry: Judgement and insight appear normal. Mood & affect appropriate.  Data Reviewed: I have personally reviewed following labs and imaging studies  CBC: Recent Labs  Lab 08/17/24 0759 08/17/24 1325 08/17/24 1741 08/17/24 2124 08/18/24 0415 08/18/24 0910 08/18/24 1406  WBC 6.1  --   --   --  4.0  --   --   HGB 10.1*   < > 9.5* 8.7* 9.4* 8.9* 8.4*  HCT 31.9*   < > 29.1* 26.1*  28.1* 26.7* 25.6*  MCV 77.1*  --   --   --  73.0*  --   --   PLT 238  --   --   --  195  --   --    < > = values in this interval not displayed.   Basic Metabolic Panel: Recent Labs  Lab 08/17/24 0759 08/17/24 1325 08/17/24 1741 08/18/24 0415 08/18/24 0910  NA 128*  --   --  133*  --   K 3.1*  --   --  2.9*  --   CL 96*  --   --  99  --   CO2 13*  --   --  21*  --   GLUCOSE 140*  --   --  100*  --   BUN 118*  --   --  109*  --   CREATININE 7.06*  --   --  4.06*  --   CALCIUM  8.4*  --   --  8.4*  --   MG  --  2.5* 2.7*  --  2.5*   GFR: Estimated Creatinine Clearance: 11.5 mL/min (A) (by C-G formula based on SCr of 4.06 mg/dL (H)). Liver Function Tests: Recent Labs  Lab 08/17/24 0759 08/18/24 0415  AST 21 18  ALT 15 12  ALKPHOS 67 57  BILITOT 0.7 0.8  PROT 7.2 6.4*  ALBUMIN 3.5 3.0*   Recent Labs  Lab 08/17/24 0759  LIPASE 34   No results for input(s): AMMONIA in the last 168 hours. Coagulation Profile: No results for input(s): INR, PROTIME in the last 168 hours. Cardiac Enzymes: No results for input(s): CKTOTAL, CKMB, CKMBINDEX, TROPONINI in the last 168 hours. BNP (last 3 results) No results for input(s): PROBNP in the last 8760 hours. HbA1C: Recent Labs    08/17/24 1325  HGBA1C 7.7*   CBG: No results for input(s): GLUCAP in the last 168 hours. Lipid Profile: No results for input(s): CHOL, HDL, LDLCALC, TRIG, CHOLHDL, LDLDIRECT in the last 72 hours. Thyroid Function Tests: No results for input(s): TSH, T4TOTAL, FREET4, T3FREE, THYROIDAB in the last 72 hours. Anemia Panel: Recent Labs    08/17/24 1325  VITAMINB12 220  FOLATE >20.0  FERRITIN 28  TIBC 368  IRON 42*  RETICCTPCT 0.9   Sepsis Labs: Recent Labs  Lab 08/17/24 1349  LATICACIDVEN 2.0*    Recent Results (from the past 240 hours)  Gastrointestinal Panel by PCR , Stool     Status: Abnormal   Collection Time: 08/17/24  1:04 PM   Specimen:  Stool  Result Value Ref Range Status   Campylobacter species NOT DETECTED NOT DETECTED Final   Plesimonas shigelloides NOT DETECTED NOT DETECTED Final   Salmonella species DETECTED (A) NOT DETECTED Final    Comment: RESULT CALLED TO, READ BACK BY AND VERIFIED WITH: RONAL EDIN 08/18/24 1218 MW    Yersinia enterocolitica NOT DETECTED NOT DETECTED Final   Vibrio species NOT DETECTED NOT DETECTED Final   Vibrio cholerae NOT DETECTED NOT DETECTED Final   Enteroaggregative E coli (EAEC) NOT DETECTED NOT DETECTED  Final   Enteropathogenic E coli (EPEC) DETECTED (A) NOT DETECTED Final    Comment: RESULT CALLED TO, READ BACK BY AND VERIFIED WITH: RONAL EDIN 08/18/24 1218 MW    Enterotoxigenic E coli (ETEC) DETECTED (A) NOT DETECTED Final    Comment: RESULT CALLED TO, READ BACK BY AND VERIFIED WITH: MARY NICKERSON 08/18/24 1218 MW    Shiga like toxin producing E coli (STEC) NOT DETECTED NOT DETECTED Final   Shigella/Enteroinvasive E coli (EIEC) NOT DETECTED NOT DETECTED Final   Cryptosporidium NOT DETECTED NOT DETECTED Final   Cyclospora cayetanensis NOT DETECTED NOT DETECTED Final   Entamoeba histolytica NOT DETECTED NOT DETECTED Final   Giardia lamblia NOT DETECTED NOT DETECTED Final   Adenovirus F40/41 NOT DETECTED NOT DETECTED Final   Astrovirus NOT DETECTED NOT DETECTED Final   Norovirus GI/GII NOT DETECTED NOT DETECTED Final   Rotavirus A NOT DETECTED NOT DETECTED Final   Sapovirus (I, II, IV, and V) NOT DETECTED NOT DETECTED Final    Comment: Performed at [facility] [address] [place], [place] [address]  C Difficile Quick Screen w PCR reflex     Status: None   Collection Time: 08/17/24  1:04 PM   Specimen: STOOL  Result Value Ref Range Status   C Diff antigen NEGATIVE NEGATIVE Final   C Diff toxin NEGATIVE NEGATIVE Final   C Diff interpretation No C. difficile detected.  Final    Comment: Performed at [facility], [address], [place],  [place] [address]         Radiology Studies: CT ABDOMEN PELVIS WO CONTRAST Result Date: 08/17/2024 CLINICAL DATA:  AMS EXAM: CT ABDOMEN AND PELVIS WITHOUT CONTRAST TECHNIQUE: Multidetector CT imaging of the abdomen and pelvis was performed following the standard protocol without IV contrast. RADIATION DOSE REDUCTION: This exam was performed according to the departmental dose-optimization program which includes automated exposure control, adjustment of the mA and/or kV according to patient size and/or use of iterative reconstruction technique. COMPARISON:  08/17/2024 renal ultrasound FINDINGS: Of note, the lack of intravenous contrast limits evaluation of the solid organ parenchyma and vascularity. Lower chest: No focal airspace consolidation or pleural effusion. Posterior bibasilar dependent atelectasis. Extensive, dense multi-vessel coronary atherosclerosis. Hepatobiliary: No mass.No radiopaque stones or wall thickening of the gallbladder. No intrahepatic or extrahepatic biliary ductal dilation. Pancreas: No mass or main ductal dilation. No peripancreatic inflammation or fluid collection. Spleen: Normal size. No mass. Adrenals/Urinary Tract: No adrenal masses. No renal mass. No hydronephrosis or nephrolithiasis. The urinary bladder is distended without focal abnormality. Stomach/Bowel: The stomach is decompressed without focal abnormality. No small bowel wall thickening or inflammation. No small bowel obstruction.The appendix was not visualized. No right lower quadrant or pericecal inflammatory changes to suggest acute appendicitis. Vascular/Lymphatic: No aortic aneurysm. Diffuse aortoiliac atherosclerosis. No intraabdominal or pelvic lymphadenopathy. Reproductive: No prostatomegaly. No free pelvic fluid. Other: No pneumoperitoneum, ascites, or mesenteric inflammation. Musculoskeletal: No acute fracture or destructive lesion. IMPRESSION: No acute intra-abdominal or pelvic abnormality. Aortic Atherosclerosis  (ICD10-I70.0). Electronically Signed   By: Rogelia Myers M.D.   On: 08/17/2024 16:04   CT Head Wo Contrast Result Date: 08/17/2024 EXAM: CT HEAD WITHOUT CONTRAST 08/17/2024 03:42:51 PM TECHNIQUE: CT of the head was performed without the administration of intravenous contrast. Automated exposure control, iterative reconstruction, and/or weight based adjustment of the mA/kV was utilized to reduce the radiation dose to as low as reasonably achievable. COMPARISON: None available. CLINICAL HISTORY: disorientation FINDINGS: BRAIN AND VENTRICLES: No acute hemorrhage.  No evidence of acute infarct. No hydrocephalus. No extra-axial collection. No mass effect or midline shift. Sclerosis of the carotid siphons. ORBITS: Chronic appearing deformity of the right lamina papyracea. Bilateral lens replacement. SINUSES: Mucosal thickening throughout the ethmoid air cells. SOFT TISSUES AND SKULL: No acute soft tissue abnormality. No skull fracture. Left mastoid effusion. IMPRESSION: 1. No acute intracranial abnormality. Electronically signed by: Donnice Mania MD 08/17/2024 03:56 PM EDT RP Workstation: [id number]   US  RENAL Result Date: 08/17/2024 CLINICAL DATA:  Acute kidney injury EXAM: RENAL / URINARY TRACT ULTRASOUND COMPLETE COMPARISON:  None available FINDINGS: Right Kidney: Renal measurements: 11.2 x 4.9 x 5.7 cm = volume: 164 mL. Echogenicity within normal limits. No mass or hydronephrosis visualized. Left Kidney: Renal measurements: 10.9 x 6.9 x 5.7 cm = volume: 225 mL. Echogenicity within normal limits. No mass or hydronephrosis visualized. Bladder: Appears normal for degree of bladder distention. Other: None. IMPRESSION: No significant sonographic abnormality of the kidneys. Electronically Signed   By: Aliene Lloyd M.D.   On: 08/17/2024 14:33   Scheduled Meds:  pantoprazole  (PROTONIX ) IV  40 mg Intravenous Q12H   sodium chloride  flush  3 mL Intravenous Q12H   Continuous Infusions:  cefTRIAXone (ROCEPHIN)  IV 2  g (08/18/24 1506)   lactated ringers  100 mL/hr at 08/18/24 0933     LOS: 1 day    Time spent: 50 mins    Darcel Dawley, MD [facility]   If 7PM-7AM, please contact night-coverage

## 2024-08-18 NOTE — Progress Notes (Signed)
Urine specimen sent to Lab

## 2024-08-19 ENCOUNTER — Encounter (HOSPITAL_COMMUNITY): Payer: Self-pay | Admitting: Internal Medicine

## 2024-08-19 ENCOUNTER — Other Ambulatory Visit (HOSPITAL_COMMUNITY): Payer: Self-pay

## 2024-08-19 DIAGNOSIS — A09 Infectious gastroenteritis and colitis, unspecified: Secondary | ICD-10-CM

## 2024-08-19 LAB — CBC
HCT: 26 % — ABNORMAL LOW (ref 39.0–52.0)
Hemoglobin: 8.4 g/dL — ABNORMAL LOW (ref 13.0–17.0)
MCH: 24.3 pg — ABNORMAL LOW (ref 26.0–34.0)
MCHC: 32.3 g/dL (ref 30.0–36.0)
MCV: 75.1 fL — ABNORMAL LOW (ref 80.0–100.0)
Platelets: 186 K/uL (ref 150–400)
RBC: 3.46 MIL/uL — ABNORMAL LOW (ref 4.22–5.81)
RDW: 14.9 % (ref 11.5–15.5)
WBC: 3.7 K/uL — ABNORMAL LOW (ref 4.0–10.5)
nRBC: 0 % (ref 0.0–0.2)

## 2024-08-19 LAB — HEMOGLOBIN AND HEMATOCRIT, BLOOD
HCT: 27 % — ABNORMAL LOW (ref 39.0–52.0)
HCT: 26 % — ABNORMAL LOW (ref 39.0–52.0)
Hemoglobin: 8.9 g/dL — ABNORMAL LOW (ref 13.0–17.0)
Hemoglobin: 8.2 g/dL — ABNORMAL LOW (ref 13.0–17.0)

## 2024-08-19 LAB — BASIC METABOLIC PANEL WITH GFR
Anion gap: 11 (ref 5–15)
BUN: 64 mg/dL — ABNORMAL HIGH (ref 8–23)
CO2: 25 mmol/L (ref 22–32)
Calcium: 8.3 mg/dL — ABNORMAL LOW (ref 8.9–10.3)
Chloride: 105 mmol/L (ref 98–111)
Creatinine, Ser: 1.41 mg/dL — ABNORMAL HIGH (ref 0.61–1.24)
GFR, Estimated: 52 mL/min — ABNORMAL LOW (ref >60)
Glucose, Bld: 137 mg/dL — ABNORMAL HIGH (ref 70–99)
Potassium: 3.2 mmol/L — ABNORMAL LOW (ref 3.5–5.1)
Sodium: 141 mmol/L (ref 135–145)

## 2024-08-19 LAB — PHOSPHORUS: Phosphorus: 2.4 mg/dL — ABNORMAL LOW (ref 2.5–4.6)

## 2024-08-19 LAB — MAGNESIUM: Magnesium: 2.4 mg/dL (ref 1.7–2.4)

## 2024-08-19 MED ORDER — POTASSIUM CHLORIDE 20 MEQ PO PACK
40.0000 meq | PACK | Freq: Once | ORAL | Status: AC
  Administered 2024-08-19: 40 meq via ORAL
  Filled 2024-08-19: qty 2

## 2024-08-19 MED ORDER — CIPROFLOXACIN HCL 500 MG PO TABS
500.0000 mg | ORAL_TABLET | Freq: Every day | ORAL | 0 refills | Status: AC
  Filled 2024-08-19 – 2024-08-20 (×2): qty 1, 1d supply, fill #0

## 2024-08-19 MED ORDER — POTASSIUM PHOSPHATES 15 MMOLE/5ML IV SOLN
30.0000 mmol | Freq: Once | INTRAVENOUS | Status: AC
  Administered 2024-08-19: 30 mmol via INTRAVENOUS
  Filled 2024-08-19: qty 10

## 2024-08-19 NOTE — Discharge Summary (Addendum)
 Physician Discharge Summary  Patrick [name] FMW:[id number] DOB: [birth year] DOA: 08/17/2024  PCP: Celestia Rosaline SQUIBB, NP  Admit date: 08/17/2024  Discharge date: 08/19/2024  Admitted From: Home  Disposition:  Home  Recommendations for Outpatient Follow-up:  Follow up with PCP in 1-2 weeks. Please obtain BMP/CBC in one week. Advised to follow-up with Nephrology for further follow-up.. Advised to take ciprofloxacin 500 mg daily for 1 day for Salmonella / E. Coli in stool. Advised to resume metformin , lisinopril  once renal functions improved.  Home Health: None Equipment/Devices:None  Discharge Condition: Stable CODE STATUS:Full code Diet recommendation: Carb Modified   Brief Summary / Hospital Course: This 78 years old Male with PMH significant for type 2 diabetes, history of alcohol abuse, history of gangrene and amputations, history of acute blood loss anemia ,  GI bleed presented in the ED with complaints of diarrhea for last 5 days.  Patient also reports having abdominal pain,  denies any nausea and vomiting.  Patient has not had alcohol within the last 8 years.He also reports black stools once during this time. He is found to have electrolyte imbalance in the ED.  Admitted for further evaluation.Patient was continued on IV hydration.  Stool for C. difficile negative,  Stool for GI panel positive for Salmonella and E. Coli. Patient continued on IV ceftriaxone.  Patient was also continued on IV hydration for AKI likely in the setting of dehydration.  Nephrology was consulted.  Renal functions has significantly improved and close to baseline.  Nephrology signed off.  Electrolytes improved.  Patient wants to be discharged home.  Patient being discharged on ciprofloxacin 500 mg daily for 1 day.  Discharge Diagnoses:  Principal Problem:   Diarrhea  Abdominal pain / Diarrhea: Salmonella /E. coli gastroenteritis: Patient presented with abdominal pain associated with diarrhea for last  5 days. Differential diagnosis include infectious diarrhea or gastroenteritis, colitis. Ethanol level within normal limits. CT abdomen and pelvis unremarkable. Stool for C. difficile negative. GI panel positive for Salmonella / E. Coli. Continue IV ceftriaxone for now. Continue adequate pain control. Continued on IV hydration. Patient reports no diarrhea since yesterday improving. Patient discharged on ciprofloxacin 500 daily for 1 day.   Altered mental status: Likely due to infection.  CT head negative. Mental status back to baseline.   Metabolic acidosis: Likely due to AKI/lactic acidosis. Improved with IV hydration.   Hypokalemia. Replaced.  Continue to monitor   Acute kidney injury: Likely in the setting of dehydration. Baseline serum creatinine normal.  Presented with creatinine of 7.06. Continue IV hydration.  Serum creatinine improving and close to baseline. Avoid Nephrotoxic medications.  Discharge Instructions  Discharge Instructions     Call MD for:  difficulty breathing, headache or visual disturbances   Complete by: As directed    Call MD for:  persistant dizziness or light-headedness   Complete by: As directed    Call MD for:  persistant nausea and vomiting   Complete by: As directed    Diet - low sodium heart healthy   Complete by: As directed    Diet Carb Modified   Complete by: As directed    Discharge instructions   Complete by: As directed    Advised follow-up with primary care physician in 1 week. Advised to follow-up with nephrology for further follow-up. Advised to take ciprofloxacin 500 mg daily for 1 day for Salmonella E. Coli in stool.   Increase activity slowly   Complete by: As directed       Allergies as  of 08/19/2024   No Known Allergies      Medication List     STOP taking these medications    lisinopril  20 MG tablet Commonly known as: ZESTRIL    Trulicity  0.75 MG/0.5ML Soaj Generic drug: Dulaglutide        TAKE these  medications    acetaminophen  500 MG tablet Commonly known as: TYLENOL  Take 1,000 mg by mouth every 8 (eight) hours as needed (pain.).   bismuth subsalicylate 262 MG/15ML suspension Commonly known as: PEPTO BISMOL Take 30 mLs by mouth every 4 (four) hours as needed for indigestion or diarrhea or loose stools.   ciprofloxacin 500 MG tablet Commonly known as: Cipro Take 1 tablet (500 mg total) by mouth daily for 1 day.   empagliflozin  25 MG Tabs tablet Commonly known as: JARDIANCE  Tome 1 tableta (25 mg en total) por va oral diariamente antes de desayunar. (Take 1 tablet (25 mg total) by mouth daily before breakfast.)   gabapentin  100 MG capsule Commonly known as: NEURONTIN  Take 2 capsules (200 mg total) by mouth 3 (three) times daily.   loperamide 2 MG tablet Commonly known as: IMODIUM A-D Take 2 mg by mouth 4 (four) times daily as needed for diarrhea or loose stools.   metFORMIN  500 MG 24 hr tablet Commonly known as: GLUCOPHAGE -XR Take 1 tablet (500 mg total) by mouth 2 (two) times daily before a meal.   pravastatin  80 MG tablet Commonly known as: PRAVACHOL  Tome 1 tableta (80 mg en total) por va oral diariamente. (Take 1 tablet (80 mg total) by mouth daily.)        Follow-up Information     Celestia Rosaline SQUIBB, NP Follow up in 1 week(s).   Specialty: Internal Medicine Contact information: [address] [place] [address] [phone]         [facility], [facility] Follow up in 1 week(s).   Contact information: [address] [place] [address] [phone]                No Known Allergies  Consultations: Nephrology   Procedures/Studies: CT ABDOMEN PELVIS WO CONTRAST Result Date: 08/17/2024 CLINICAL DATA:  AMS EXAM: CT ABDOMEN AND PELVIS WITHOUT CONTRAST TECHNIQUE: Multidetector CT imaging of the abdomen and pelvis was performed following the standard protocol without IV contrast. RADIATION DOSE REDUCTION: This exam was performed  according to the departmental dose-optimization program which includes automated exposure control, adjustment of the mA and/or kV according to patient size and/or use of iterative reconstruction technique. COMPARISON:  08/17/2024 renal ultrasound FINDINGS: Of note, the lack of intravenous contrast limits evaluation of the solid organ parenchyma and vascularity. Lower chest: No focal airspace consolidation or pleural effusion. Posterior bibasilar dependent atelectasis. Extensive, dense multi-vessel coronary atherosclerosis. Hepatobiliary: No mass.No radiopaque stones or wall thickening of the gallbladder. No intrahepatic or extrahepatic biliary ductal dilation. Pancreas: No mass or main ductal dilation. No peripancreatic inflammation or fluid collection. Spleen: Normal size. No mass. Adrenals/Urinary Tract: No adrenal masses. No renal mass. No hydronephrosis or nephrolithiasis. The urinary bladder is distended without focal abnormality. Stomach/Bowel: The stomach is decompressed without focal abnormality. No small bowel wall thickening or inflammation. No small bowel obstruction.The appendix was not visualized. No right lower quadrant or pericecal inflammatory changes to suggest acute appendicitis. Vascular/Lymphatic: No aortic aneurysm. Diffuse aortoiliac atherosclerosis. No intraabdominal or pelvic lymphadenopathy. Reproductive: No prostatomegaly. No free pelvic fluid. Other: No pneumoperitoneum, ascites, or mesenteric inflammation. Musculoskeletal: No acute fracture or destructive lesion. IMPRESSION: No acute intra-abdominal or pelvic abnormality.  Aortic Atherosclerosis (ICD10-I70.0). Electronically Signed   By: Rogelia Myers M.D.   On: 08/17/2024 16:04   CT Head Wo Contrast Result Date: 08/17/2024 EXAM: CT HEAD WITHOUT CONTRAST 08/17/2024 03:42:51 PM TECHNIQUE: CT of the head was performed without the administration of intravenous contrast. Automated exposure control, iterative reconstruction, and/or weight  based adjustment of the mA/kV was utilized to reduce the radiation dose to as low as reasonably achievable. COMPARISON: None available. CLINICAL HISTORY: disorientation FINDINGS: BRAIN AND VENTRICLES: No acute hemorrhage. No evidence of acute infarct. No hydrocephalus. No extra-axial collection. No mass effect or midline shift. Sclerosis of the carotid siphons. ORBITS: Chronic appearing deformity of the right lamina papyracea. Bilateral lens replacement. SINUSES: Mucosal thickening throughout the ethmoid air cells. SOFT TISSUES AND SKULL: No acute soft tissue abnormality. No skull fracture. Left mastoid effusion. IMPRESSION: 1. No acute intracranial abnormality. Electronically signed by: Donnice Mania MD 08/17/2024 03:56 PM EDT RP Workstation: [id number]   US  RENAL Result Date: 08/17/2024 CLINICAL DATA:  Acute kidney injury EXAM: RENAL / URINARY TRACT ULTRASOUND COMPLETE COMPARISON:  None available FINDINGS: Right Kidney: Renal measurements: 11.2 x 4.9 x 5.7 cm = volume: 164 mL. Echogenicity within normal limits. No mass or hydronephrosis visualized. Left Kidney: Renal measurements: 10.9 x 6.9 x 5.7 cm = volume: 225 mL. Echogenicity within normal limits. No mass or hydronephrosis visualized. Bladder: Appears normal for degree of bladder distention. Other: None. IMPRESSION: No significant sonographic abnormality of the kidneys. Electronically Signed   By: Aliene Lloyd M.D.   On: 08/17/2024 14:33   Subjective: Patient was seen and examined at bedside.  Overnight events noted. Patient reports feeling much improved.  Renal functions are improving and wants to be discharged home,  diarrhea has resolved.  Discharge Exam: Vitals:   08/19/24 0838 08/19/24 1159  BP: (!) 169/72 (!) 160/71  Pulse: 72 76  Resp: 16 17  Temp: 98.5 F (36.9 C) 98.6 F (37 C)  SpO2: 93% 97%   Vitals:   08/19/24 0437 08/19/24 0500 08/19/24 0838 08/19/24 1159  BP: (!) 159/71  (!) 169/72 (!) 160/71  Pulse: 74  72 76  Resp: 20   16 17   Temp: 98.3 F (36.8 C)  98.5 F (36.9 C) 98.6 F (37 C)  TempSrc: Oral  Oral Oral  SpO2: 98%  93% 97%  Weight:  60.7 kg    Height:        General: Pt is alert, awake, not in acute distress Cardiovascular: RRR, S1/S2 +, no rubs, no gallops Respiratory: CTA bilaterally, no wheezing, no rhonchi Abdominal: Soft, NT, ND, bowel sounds + Extremities: no edema, no cyanosis    The results of significant diagnostics from this hospitalization (including imaging, microbiology, ancillary and laboratory) are listed below for reference.     Microbiology: Recent Results (from the past 240 hours)  Gastrointestinal Panel by PCR , Stool     Status: Abnormal   Collection Time: 08/17/24  1:04 PM   Specimen: Stool  Result Value Ref Range Status   Campylobacter species NOT DETECTED NOT DETECTED Final   Plesimonas shigelloides NOT DETECTED NOT DETECTED Final   Salmonella species DETECTED (A) NOT DETECTED Final    Comment: RESULT CALLED TO, READ BACK BY AND VERIFIED WITH: RONAL EDIN 08/18/24 1218 MW    Yersinia enterocolitica NOT DETECTED NOT DETECTED Final   Vibrio species NOT DETECTED NOT DETECTED Final   Vibrio cholerae NOT DETECTED NOT DETECTED Final   Enteroaggregative E coli (EAEC) NOT DETECTED NOT DETECTED Final  Enteropathogenic E coli (EPEC) DETECTED (A) NOT DETECTED Final    Comment: RESULT CALLED TO, READ BACK BY AND VERIFIED WITH: RONAL EDIN 08/18/24 1218 MW    Enterotoxigenic E coli (ETEC) DETECTED (A) NOT DETECTED Final    Comment: RESULT CALLED TO, READ BACK BY AND VERIFIED WITH: MARY NICKERSON 08/18/24 1218 MW    Shiga like toxin producing E coli (STEC) NOT DETECTED NOT DETECTED Final   Shigella/Enteroinvasive E coli (EIEC) NOT DETECTED NOT DETECTED Final   Cryptosporidium NOT DETECTED NOT DETECTED Final   Cyclospora cayetanensis NOT DETECTED NOT DETECTED Final   Entamoeba histolytica NOT DETECTED NOT DETECTED Final   Giardia lamblia NOT DETECTED NOT DETECTED  Final   Adenovirus F40/41 NOT DETECTED NOT DETECTED Final   Astrovirus NOT DETECTED NOT DETECTED Final   Norovirus GI/GII NOT DETECTED NOT DETECTED Final   Rotavirus A NOT DETECTED NOT DETECTED Final   Sapovirus (I, II, IV, and V) NOT DETECTED NOT DETECTED Final    Comment: Performed at [facility], [address] [place], [place] [address]  C Difficile Quick Screen w PCR reflex     Status: None   Collection Time: 08/17/24  1:04 PM   Specimen: STOOL  Result Value Ref Range Status   C Diff antigen NEGATIVE NEGATIVE Final   C Diff toxin NEGATIVE NEGATIVE Final   C Diff interpretation No C. difficile detected.  Final    Comment: Performed at [facility], [address] [place], [place] [address]     Labs: BNP (last 3 results) No results for input(s): BNP in the last 8760 hours. Basic Metabolic Panel: Recent Labs  Lab 08/17/24 0759 08/17/24 1325 08/17/24 1741 08/18/24 0415 08/18/24 0910 08/19/24 0200  NA 128*  --   --  133*  --  141  K 3.1*  --   --  2.9*  --  3.2*  CL 96*  --   --  99  --  105  CO2 13*  --   --  21*  --  25  GLUCOSE 140*  --   --  100*  --  137*  BUN 118*  --   --  109*  --  64*  CREATININE 7.06*  --   --  4.06*  --  1.41*  CALCIUM  8.4*  --   --  8.4*  --  8.3*  MG  --  2.5* 2.7*  --  2.5* 2.4  PHOS  --   --   --   --   --  2.4*   Liver Function Tests: Recent Labs  Lab 08/17/24 0759 08/18/24 0415  AST 21 18  ALT 15 12  ALKPHOS 67 57  BILITOT 0.7 0.8  PROT 7.2 6.4*  ALBUMIN 3.5 3.0*   Recent Labs  Lab 08/17/24 0759  LIPASE 34   No results for input(s): AMMONIA in the last 168 hours. CBC: Recent Labs  Lab 08/17/24 0759 08/17/24 1325 08/18/24 0415 08/18/24 0910 08/18/24 1406 08/18/24 1817 08/18/24 2148 08/19/24 0200 08/19/24 0841  WBC 6.1  --  4.0  --   --   --   --  3.7*  --   HGB 10.1*   < > 9.4*   < > 8.4* 8.6* 8.4* 8.4* 8.9*  HCT 31.9*   < > 28.1*   < > 25.6* 26.5* 25.9* 26.0* 27.0*  MCV 77.1*  --  73.0*   --   --   --   --  75.1*  --  PLT 238  --  195  --   --   --   --  186  --    < > = values in this interval not displayed.   Cardiac Enzymes: No results for input(s): CKTOTAL, CKMB, CKMBINDEX, TROPONINI in the last 168 hours. BNP: Invalid input(s): POCBNP CBG: No results for input(s): GLUCAP in the last 168 hours. D-Dimer No results for input(s): DDIMER in the last 72 hours. Hgb A1c Recent Labs    08/17/24 1325  HGBA1C 7.7*   Lipid Profile No results for input(s): CHOL, HDL, LDLCALC, TRIG, CHOLHDL, LDLDIRECT in the last 72 hours. Thyroid function studies No results for input(s): TSH, T4TOTAL, T3FREE, THYROIDAB in the last 72 hours.  Invalid input(s): FREET3 Anemia work up Recent Labs    08/17/24 1325  VITAMINB12 220  FOLATE >20.0  FERRITIN 28  TIBC 368  IRON 42*  RETICCTPCT 0.9   Urinalysis    Component Value Date/Time   COLORURINE YELLOW 08/18/2024 0806   APPEARANCEUR HAZY (A) 08/18/2024 0806   LABSPEC 1.009 08/18/2024 0806   PHURINE 5.0 08/18/2024 0806   GLUCOSEU NEGATIVE 08/18/2024 0806   HGBUR NEGATIVE 08/18/2024 0806   BILIRUBINUR NEGATIVE 08/18/2024 0806   KETONESUR NEGATIVE 08/18/2024 0806   PROTEINUR NEGATIVE 08/18/2024 0806   NITRITE NEGATIVE 08/18/2024 0806   LEUKOCYTESUR NEGATIVE 08/18/2024 0806   Sepsis Labs Recent Labs  Lab 08/17/24 0759 08/18/24 0415 08/19/24 0200  WBC 6.1 4.0 3.7*   Microbiology Recent Results (from the past 240 hours)  Gastrointestinal Panel by PCR , Stool     Status: Abnormal   Collection Time: 08/17/24  1:04 PM   Specimen: Stool  Result Value Ref Range Status   Campylobacter species NOT DETECTED NOT DETECTED Final   Plesimonas shigelloides NOT DETECTED NOT DETECTED Final   Salmonella species DETECTED (A) NOT DETECTED Final    Comment: RESULT CALLED TO, READ BACK BY AND VERIFIED WITH: RONAL EDIN 08/18/24 1218 MW    Yersinia enterocolitica NOT DETECTED NOT DETECTED Final    Vibrio species NOT DETECTED NOT DETECTED Final   Vibrio cholerae NOT DETECTED NOT DETECTED Final   Enteroaggregative E coli (EAEC) NOT DETECTED NOT DETECTED Final   Enteropathogenic E coli (EPEC) DETECTED (A) NOT DETECTED Final    Comment: RESULT CALLED TO, READ BACK BY AND VERIFIED WITH: RONAL EDIN 08/18/24 1218 MW    Enterotoxigenic E coli (ETEC) DETECTED (A) NOT DETECTED Final    Comment: RESULT CALLED TO, READ BACK BY AND VERIFIED WITH: RONAL EDIN 08/18/24 1218 MW    Shiga like toxin producing E coli (STEC) NOT DETECTED NOT DETECTED Final   Shigella/Enteroinvasive E coli (EIEC) NOT DETECTED NOT DETECTED Final   Cryptosporidium NOT DETECTED NOT DETECTED Final   Cyclospora cayetanensis NOT DETECTED NOT DETECTED Final   Entamoeba histolytica NOT DETECTED NOT DETECTED Final   Giardia lamblia NOT DETECTED NOT DETECTED Final   Adenovirus F40/41 NOT DETECTED NOT DETECTED Final   Astrovirus NOT DETECTED NOT DETECTED Final   Norovirus GI/GII NOT DETECTED NOT DETECTED Final   Rotavirus A NOT DETECTED NOT DETECTED Final   Sapovirus (I, II, IV, and V) NOT DETECTED NOT DETECTED Final    Comment: Performed at [facility], [address] [place], [place] [address]  C Difficile Quick Screen w PCR reflex     Status: None   Collection Time: 08/17/24  1:04 PM   Specimen: STOOL  Result Value Ref Range Status   C Diff antigen NEGATIVE NEGATIVE Final  C Diff toxin NEGATIVE NEGATIVE Final   C Diff interpretation No C. difficile detected.  Final    Comment: Performed at [facility], [address], [place], [place] [address]     Time coordinating discharge: Over 30 minutes  SIGNED:   Darcel Dawley, MD  [facility] 08/19/2024, 1:48 PM Pager   If 7PM-7AM, please contact night-coverage

## 2024-08-19 NOTE — Plan of Care (Signed)
 Provide  nutrition supplement (ensure)

## 2024-08-19 NOTE — Discharge Instructions (Signed)
 Advised follow-up with primary care physician in 1 week. Advised to follow-up with nephrology for further follow-up. Advised to take ciprofloxacin 500 mg daily for 1 day for Salmonella E. Coli in stool.

## 2024-08-20 ENCOUNTER — Other Ambulatory Visit (HOSPITAL_COMMUNITY): Payer: Self-pay

## 2024-08-20 ENCOUNTER — Telehealth (INDEPENDENT_AMBULATORY_CARE_PROVIDER_SITE_OTHER): Payer: Self-pay

## 2024-08-20 ENCOUNTER — Other Ambulatory Visit: Payer: Self-pay

## 2024-08-20 NOTE — Transitions of Care (Post Inpatient/ED Visit) (Signed)
   08/20/2024  Name: Patrick [name] MRN: [id number] DOB: 03-Dec-1945  Today's TOC FU Call Status: Today's TOC FU Call Status:: Successful TOC FU Call Completed TOC FU Call Complete Date: 08/20/24 Patient's Name and Date of Birth confirmed.  Transition Care Management Follow-up Telephone Call Date of Discharge: 08/19/24 Discharge Facility: [facility]) Type of Discharge: Inpatient Admission Primary Inpatient Discharge Diagnosis:: diarrhea How have you been since you were released from the hospital?: Better Any questions or concerns?: No  Items Reviewed: Did you receive and understand the discharge instructions provided?: Yes Medications obtained,verified, and reconciled?: Yes (Medications Reviewed) Any new allergies since your discharge?: No Dietary orders reviewed?: Yes Do you have support at home?: Yes People in Home [RPT]: spouse  Medications Reviewed Today: Medications Reviewed Today     Reviewed by Emmitt Pan, LPN (Licensed Practical Nurse) on 08/20/24 at [id number]  Med List Status: <None>   Medication Order Taking? Sig Documenting Provider Last Dose Status Informant  acetaminophen  (TYLENOL ) 500 MG tablet [id number] Yes Take 1,000 mg by mouth every 8 (eight) hours as needed (pain.). [provider]  Active Child, Pharmacy Records  bismuth subsalicylate (PEPTO BISMOL) 262 MG/15ML suspension [id number] Yes Take 30 mLs by mouth every 4 (four) hours as needed for indigestion or diarrhea or loose stools. [provider]  Active Child, Pharmacy Records  ciprofloxacin (CIPRO) 500 MG tablet [id number] Yes Take 1 tablet (500 mg total) by mouth daily for 1 day. Leotis Bogus, MD  Active   empagliflozin  (JARDIANCE ) 25 MG TABS tablet [id number] Yes Take 1 tablet (25 mg total) by mouth daily before breakfast. Celestia Rosaline SQUIBB, NP  Active Child, Pharmacy Records  gabapentin  (NEURONTIN ) 100 MG capsule [id number] Yes Take 2 capsules (200 mg total) by mouth 3 (three)  times daily. Celestia Rosaline SQUIBB, NP  Active Child, Pharmacy Records  loperamide (IMODIUM A-D) 2 MG tablet [id number] Yes Take 2 mg by mouth 4 (four) times daily as needed for diarrhea or loose stools. [provider]  Active Child, Pharmacy Records  metFORMIN  (GLUCOPHAGE -XR) 500 MG 24 hr tablet [id number] Yes Take 1 tablet (500 mg total) by mouth 2 (two) times daily before a meal. Celestia Rosaline SQUIBB, NP  Active Child, Pharmacy Records  pravastatin  (PRAVACHOL ) 80 MG tablet [id number] Yes Take 1 tablet (80 mg total) by mouth daily. Celestia Rosaline SQUIBB, NP  Active Child, Pharmacy Records            Home Care and Equipment/Supplies: Were Home Health Services Ordered?: NA Any new equipment or medical supplies ordered?: NA  Functional Questionnaire: Do you need assistance with bathing/showering or dressing?: No Do you need assistance with meal preparation?: No Do you need assistance with eating?: No Do you have difficulty maintaining continence: No Do you need assistance with getting out of bed/getting out of a chair/moving?: No Do you have difficulty managing or taking your medications?: No  Follow up appointments reviewed: PCP Follow-up appointment confirmed?: Yes Date of PCP follow-up appointment?: 08/24/24 Follow-up Provider: [facility] Follow-up appointment confirmed?: NA Do you need transportation to your follow-up appointment?: No Do you understand care options if your condition(s) worsen?: Yes-patient verbalized understanding    SIGNATURE Pan Emmitt, LPN [facility] Nurse Health Advisor Direct Dial  [phone]

## 2024-08-24 ENCOUNTER — Encounter: Payer: Self-pay | Admitting: Family Medicine

## 2024-08-24 ENCOUNTER — Ambulatory Visit (INDEPENDENT_AMBULATORY_CARE_PROVIDER_SITE_OTHER): Payer: Self-pay | Admitting: Family Medicine

## 2024-08-24 VITALS — BP 170/89 | HR 83 | Ht 61.0 in | Wt 156.4 lb

## 2024-08-24 DIAGNOSIS — Z8619 Personal history of other infectious and parasitic diseases: Secondary | ICD-10-CM

## 2024-08-24 DIAGNOSIS — Z09 Encounter for follow-up examination after completed treatment for conditions other than malignant neoplasm: Secondary | ICD-10-CM

## 2024-08-24 DIAGNOSIS — N179 Acute kidney failure, unspecified: Secondary | ICD-10-CM

## 2024-08-24 DIAGNOSIS — I10 Essential (primary) hypertension: Secondary | ICD-10-CM

## 2024-08-24 DIAGNOSIS — Z789 Other specified health status: Secondary | ICD-10-CM

## 2024-08-25 ENCOUNTER — Ambulatory Visit: Payer: Self-pay | Admitting: Family Medicine

## 2024-08-25 LAB — CBC WITH DIFFERENTIAL/PLATELET
Basophils Absolute: 0 x10E3/uL (ref 0.0–0.2)
Basos: 1 %
EOS (ABSOLUTE): 0.1 x10E3/uL (ref 0.0–0.4)
Eos: 2 %
Hematocrit: 32.9 % — ABNORMAL LOW (ref 37.5–51.0)
Hemoglobin: 9.9 g/dL — ABNORMAL LOW (ref 13.0–17.7)
Immature Grans (Abs): 0 x10E3/uL (ref 0.0–0.1)
Immature Granulocytes: 0 %
Lymphocytes Absolute: 1.8 x10E3/uL (ref 0.7–3.1)
Lymphs: 32 %
MCH: 24 pg — ABNORMAL LOW (ref 26.6–33.0)
MCHC: 30.1 g/dL — ABNORMAL LOW (ref 31.5–35.7)
MCV: 80 fL (ref 79–97)
Monocytes Absolute: 0.4 x10E3/uL (ref 0.1–0.9)
Monocytes: 7 %
Neutrophils Absolute: 3.4 x10E3/uL (ref 1.4–7.0)
Neutrophils: 58 %
Platelets: 234 x10E3/uL (ref 150–450)
RBC: 4.13 x10E6/uL — ABNORMAL LOW (ref 4.14–5.80)
RDW: 14.8 % (ref 11.6–15.4)
WBC: 5.7 x10E3/uL (ref 3.4–10.8)

## 2024-08-25 LAB — BASIC METABOLIC PANEL WITH GFR
BUN/Creatinine Ratio: 24 (ref 10–24)
BUN: 15 mg/dL (ref 8–27)
CO2: 21 mmol/L (ref 20–29)
Calcium: 9 mg/dL (ref 8.6–10.2)
Chloride: 102 mmol/L (ref 96–106)
Creatinine, Ser: 0.62 mg/dL — ABNORMAL LOW (ref 0.76–1.27)
Glucose: 203 mg/dL — ABNORMAL HIGH (ref 70–99)
Potassium: 4.3 mmol/L (ref 3.5–5.2)
Sodium: 138 mmol/L (ref 134–144)
eGFR: 99 mL/min/1.73 (ref >59)

## 2024-08-25 NOTE — Progress Notes (Signed)
 Established Patient Office Visit  Subjective    Patient ID: Patrick [name], male    DOB: 12/09/1945  Age: 78 y.o. MRN: [id number]  CC:  Chief Complaint  Patient presents with   Hospitalization Follow-up    Pt's daughter would like to make sure all his levels are good from the hospital     HPI Patrick [name] presents for hospital discharge follow up where he was admitted with salmonella gastroenteritis and AKI. Patient reports that he is much improved and has no complaints. This visit was aided by an interpreter.   Outpatient Encounter Medications as of 08/24/2024  Medication Sig   gabapentin  (NEURONTIN ) 100 MG capsule Take 2 capsules (200 mg total) by mouth 3 (three) times daily.   pravastatin  (PRAVACHOL ) 80 MG tablet Take 1 tablet (80 mg total) by mouth daily.   acetaminophen  (TYLENOL ) 500 MG tablet Take 1,000 mg by mouth every 8 (eight) hours as needed (pain.). (Patient not taking: Reported on 08/24/2024)   bismuth subsalicylate (PEPTO BISMOL) 262 MG/15ML suspension Take 30 mLs by mouth every 4 (four) hours as needed for indigestion or diarrhea or loose stools. (Patient not taking: Reported on 08/24/2024)   empagliflozin  (JARDIANCE ) 25 MG TABS tablet Take 1 tablet (25 mg total) by mouth daily before breakfast. (Patient not taking: Reported on 08/24/2024)   loperamide (IMODIUM A-D) 2 MG tablet Take 2 mg by mouth 4 (four) times daily as needed for diarrhea or loose stools. (Patient not taking: Reported on 08/24/2024)   metFORMIN  (GLUCOPHAGE -XR) 500 MG 24 hr tablet Take 1 tablet (500 mg total) by mouth 2 (two) times daily before a meal. (Patient not taking: Reported on 08/24/2024)   No facility-administered encounter medications on file as of 08/24/2024.    Past Medical History:  Diagnosis Date   Diabetes mellitus without complication (HCC)    History of alcohol abuse    quit in 2015   Hypertension    Peripheral vascular disease     Past Surgical History:  Procedure  Laterality Date   ABDOMINAL AORTOGRAM W/LOWER EXTREMITY Left 05/16/2023   Procedure: ABDOMINAL AORTOGRAM W/LOWER EXTREMITY;  Surgeon: Magda Debby SAILOR, MD;  Location: [facility];  Service: Cardiovascular;  Laterality: Left;   AMPUTATION Left 07/04/2023   Procedure: LEFT 4TH TOE AMPUTATION;  Surgeon: Harden Jerona GAILS, MD;  Location: [facility] OR;  Service: Orthopedics;  Laterality: Left;   AMPUTATION Left 07/16/2023   Procedure: LEFT BELOW KNEE AMPUTATION;  Surgeon: Harden Jerona GAILS, MD;  Location: [facility] OR;  Service: Orthopedics;  Laterality: Left;   APPLICATION OF WOUND VAC Left 07/16/2023   Procedure: APPLICATION OF WOUND VAC;  Surgeon: Harden Jerona GAILS, MD;  Location: [facility] OR;  Service: Orthopedics;  Laterality: Left;   I & D EXTREMITY Left 10/24/2021   Procedure: IRRIGATION AND DEBRIDEMENT OF KNEE AND EXCISION PRE-PATELLA BURSA;  Surgeon: Doll Skates, MD;  Location: [facility] OR;  Service: Orthopedics;  Laterality: Left;   PERIPHERAL VASCULAR BALLOON ANGIOPLASTY Left 05/16/2023   Procedure: PERIPHERAL VASCULAR BALLOON ANGIOPLASTY;  Surgeon: Magda Debby SAILOR, MD;  Location: [facility];  Service: Cardiovascular;  Laterality: Left;  left AT and left peroneal    Family History  Problem Relation Age of Onset   Diabetes Sister    Cancer Sister    Diabetes Brother     Social History   Socioeconomic History   Marital status: Single    Spouse name: Not on file   Number of children: Not on file   Years  of education: Not on file   Highest education level: Not on file  Occupational History   Not on file  Tobacco Use   Smoking status: Never   Smokeless tobacco: Not on file  Vaping Use   Vaping status: Never Used  Substance and Sexual Activity   Alcohol use: Not Currently   Drug use: No   Sexual activity: Not on file  Other Topics Concern   Not on file  Social History Narrative   Not on file   Social Drivers of Health   Financial Resource Strain: Not on file  Food Insecurity: No Food  Insecurity (08/17/2024)   Hunger Vital Sign    Worried About Running Out of Food in the Last Year: Never true    Ran Out of Food in the Last Year: Never true  Transportation Needs: No Transportation Needs (08/17/2024)   PRAPARE - Administrator, Civil Service (Medical): No    Lack of Transportation (Non-Medical): No  Physical Activity: Not on file  Stress: Not on file  Social Connections: Moderately Isolated (08/17/2024)   Social Connection and Isolation Panel    Frequency of Communication with Friends and Family: Three times a week    Frequency of Social Gatherings with Friends and Family: Three times a week    Attends Religious Services: Never    Active Member of Clubs or Organizations: No    Attends Banker Meetings: Never    Marital Status: Married  Catering Manager Violence: Not At Risk (08/17/2024)   Humiliation, Afraid, Rape, and Kick questionnaire    Fear of Current or Ex-Partner: No    Emotionally Abused: No    Physically Abused: No    Sexually Abused: No    Review of Systems  All other systems reviewed and are negative.       Objective    BP (!) 170/89   Pulse 83   Ht 5' 1 (1.549 m)   Wt 156 lb 6.4 oz (70.9 kg)   SpO2 97%   BMI 29.55 kg/m   Physical Exam Vitals and nursing note reviewed.  Constitutional:      General: He is not in acute distress. Cardiovascular:     Rate and Rhythm: Normal rate and regular rhythm.  Pulmonary:     Effort: Pulmonary effort is normal.     Breath sounds: Normal breath sounds.  Abdominal:     Palpations: Abdomen is soft.     Tenderness: There is no abdominal tenderness.  Musculoskeletal:     Comments: utilizing wheelchair  Neurological:     General: No focal deficit present.     Mental Status: He is alert and oriented to person, place, and time.         Assessment & Plan:   1. AKI (acute kidney injury) (Primary) Monitoring labs ordered - Basic Metabolic Panel - CBC with  Differential  2. History of Salmonella gastroenteritis As above - Basic Metabolic Panel - CBC with Differential  3. Essential hypertension Elevated readings. Follow up with PCP  4. Language barrier to communication    No follow-ups on file.   Tanda Raguel SQUIBB, MD

## 2024-08-25 NOTE — Progress Notes (Signed)
 Patient reviewed lab results and provider recommendations via MyChart

## 2024-08-30 ENCOUNTER — Other Ambulatory Visit: Payer: Self-pay

## 2024-08-30 ENCOUNTER — Encounter: Payer: Self-pay | Admitting: Radiology

## 2024-08-31 ENCOUNTER — Other Ambulatory Visit: Payer: Self-pay

## 2024-08-31 MED ORDER — LISINOPRIL 10 MG PO TABS
10.0000 mg | ORAL_TABLET | Freq: Every day | ORAL | 3 refills | Status: AC
  Filled 2024-08-31: qty 90, 90d supply, fill #0
  Filled 2024-12-02 (×2): qty 90, 90d supply, fill #1

## 2024-08-31 NOTE — Telephone Encounter (Signed)
 Please inform daughter that pt will need to go back to kidney doctor for labs. They will need to reach out to them

## 2024-08-31 NOTE — Telephone Encounter (Signed)
 Copied from CRM [id number]. Topic: Clinical - Request for Lab/Test Order >> Aug 31, 2024 10:03 AM Marylynn H wrote: Reason for CRM: Patients daughter Hadassah states patient had new meds added for kidneys and is needing labs to check progress 2 weeks from now, please advise # [phone]

## 2024-09-02 ENCOUNTER — Other Ambulatory Visit: Payer: Self-pay

## 2024-09-02 LAB — MISCELLANEOUS TEST

## 2024-09-06 ENCOUNTER — Other Ambulatory Visit: Payer: Self-pay

## 2024-09-27 ENCOUNTER — Telehealth (INDEPENDENT_AMBULATORY_CARE_PROVIDER_SITE_OTHER): Payer: Self-pay | Admitting: Primary Care

## 2024-09-27 NOTE — Telephone Encounter (Signed)
 Called pt to reschedule appt bc pt scheduled for a day the provider will not be in office. Please advise and reschedule if pt call back.

## 2024-09-29 ENCOUNTER — Ambulatory Visit (INDEPENDENT_AMBULATORY_CARE_PROVIDER_SITE_OTHER): Payer: Self-pay | Admitting: Primary Care

## 2024-10-11 ENCOUNTER — Other Ambulatory Visit: Payer: Self-pay

## 2024-10-11 ENCOUNTER — Other Ambulatory Visit (INDEPENDENT_AMBULATORY_CARE_PROVIDER_SITE_OTHER): Payer: Self-pay | Admitting: Primary Care

## 2024-10-11 MED ORDER — GABAPENTIN 100 MG PO CAPS
200.0000 mg | ORAL_CAPSULE | Freq: Three times a day (TID) | ORAL | 1 refills | Status: AC
  Filled 2024-10-11: qty 180, 30d supply, fill #0
  Filled 2024-12-02: qty 180, 30d supply, fill #1

## 2024-10-14 ENCOUNTER — Other Ambulatory Visit: Payer: Self-pay

## 2024-12-02 ENCOUNTER — Other Ambulatory Visit: Payer: Self-pay

## 2024-12-03 ENCOUNTER — Other Ambulatory Visit: Payer: Self-pay

## 2024-12-10 ENCOUNTER — Ambulatory Visit (INDEPENDENT_AMBULATORY_CARE_PROVIDER_SITE_OTHER): Payer: Self-pay | Admitting: Primary Care
